# Patient Record
Sex: Male | Born: 1960 | Race: White | Hispanic: No | Marital: Married | Smoking: Former smoker
Health system: Southern US, Community
[De-identification: ages and names within clinical notes are randomized; demographics above are authoritative.]

## PROBLEM LIST (undated history)

## (undated) DIAGNOSIS — Z86718 Personal history of other venous thrombosis and embolism: Secondary | ICD-10-CM

## (undated) DIAGNOSIS — I4892 Unspecified atrial flutter: Secondary | ICD-10-CM

## (undated) DIAGNOSIS — I429 Cardiomyopathy, unspecified: Secondary | ICD-10-CM

## (undated) DIAGNOSIS — R0600 Dyspnea, unspecified: Secondary | ICD-10-CM

## (undated) DIAGNOSIS — I2699 Other pulmonary embolism without acute cor pulmonale: Secondary | ICD-10-CM

## (undated) DIAGNOSIS — K219 Gastro-esophageal reflux disease without esophagitis: Secondary | ICD-10-CM

## (undated) HISTORY — DX: Other pulmonary embolism without acute cor pulmonale: I26.99

## (undated) HISTORY — DX: Personal history of other venous thrombosis and embolism: Z86.718

---

## 2017-05-14 ENCOUNTER — Encounter: Payer: Self-pay | Admitting: Internal Medicine

## 2017-05-14 ENCOUNTER — Ambulatory Visit (INDEPENDENT_AMBULATORY_CARE_PROVIDER_SITE_OTHER): Payer: Managed Care, Other (non HMO) | Admitting: Internal Medicine

## 2017-05-14 VITALS — BP 136/92 | HR 64 | Ht 75.0 in | Wt 278.8 lb

## 2017-05-14 DIAGNOSIS — R0609 Other forms of dyspnea: Secondary | ICD-10-CM | POA: Diagnosis not present

## 2017-05-14 DIAGNOSIS — I82409 Acute embolism and thrombosis of unspecified deep veins of unspecified lower extremity: Secondary | ICD-10-CM | POA: Insufficient documentation

## 2017-05-14 DIAGNOSIS — I82502 Chronic embolism and thrombosis of unspecified deep veins of left lower extremity: Secondary | ICD-10-CM

## 2017-05-14 DIAGNOSIS — R05 Cough: Secondary | ICD-10-CM

## 2017-05-14 DIAGNOSIS — IMO0001 Reserved for inherently not codable concepts without codable children: Secondary | ICD-10-CM

## 2017-05-14 DIAGNOSIS — R058 Other specified cough: Secondary | ICD-10-CM

## 2017-05-14 MED ORDER — PANTOPRAZOLE SODIUM 40 MG PO TBEC: 40.0000 mg | DELAYED_RELEASE_TABLET | Freq: Every day | ORAL | 2 refills | Status: DC

## 2017-05-14 MED ORDER — RIVAROXABAN 10 MG PO TABS: 10.0000 mg | ORAL_TABLET | Freq: Every day | ORAL | 11 refills | Status: DC

## 2017-05-14 MED ORDER — FAMOTIDINE 20 MG PO TABS: ORAL_TABLET | ORAL | 11 refills | Status: DC

## 2017-05-14 NOTE — Patient Instructions (Addendum)
Xarelto 10 mg daily   Pantoprazole (protonix) 40 mg   Take  30-60 min before first meal of the day and Pepcid (famotidine)  20 mg one @  bedtime until return to office - this is the best way to tell whether stomach acid is contributing to your problem.      GERD (REFLUX)  is an extremely common cause of respiratory symptoms just like yours , many times with no obvious heartburn at all.    It can be treated with medication, but also with lifestyle changes including elevation of the head of your bed (ideally with 6 inch  bed blocks),  Smoking cessation, avoidance of late meals, excessive alcohol, and avoid fatty foods, chocolate, peppermint, colas, red wine, and acidic juices such as orange juice.  NO MINT OR MENTHOL PRODUCTS SO NO COUGH DROPS   USE SUGARLESS CANDY INSTEAD (Jolley ranchers or Stover's or Life Savers) or even ice chips will also do - the key is to swallow to prevent all throat clearing. NO OIL BASED VITAMINS - use powdered substitutes.   Please schedule a follow up office visit in 6 weeks, call sooner if needed  With pfts on return

## 2017-05-14 NOTE — Progress Notes (Signed)
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 Pulmonary Consult    Self referral. Pt has hx of PE. Pt states he has spells and chest congestion and SOB for the past 4 yrs. He states his symptoms come and go, and today is a good day for him.  He last had symptoms approx 6 wks ago. He has occ noct cough- non prod at this time.   reproducible doe x steps Walks 3 miles 3 times a week in 30 min some hills  Every night coughs x 2 years immediately on supine position  Worse cough with voice use or beer drinking  Note father and brother may also have dvt/pe but his w/u for hypercoagulability neg in [place] per report    No obvious other patterns in sob or cough with any significant day to day or daytime variability or assoc excess/ purulent sputum or mucus plugs or hemoptysis or cp or chest tightness, subjective wheeze or overt sinus or hb symptoms. No unusual exp hx or h/o childhood pna/ asthma or knowledge of premature birth.  Sleeping ok without nocturnal  or early am exacerbation  of respiratory  c/o's or need for noct saba. Also denies any obvious fluctuation of symptoms with weather or environmental changes or other aggravating or alleviating factors except as outlined above   Current Medications, Allergies, Complete Past Medical History, Past Surgical History, Family History,  and Social History were reviewed in [employer] record.  ROS  The following are not active complaints unless bolded sore throat, dysphagia, dental problems, itching, sneezing,  nasal congestion or excess/ purulent secretions, ear ache,   fever, chills, sweats, unintended wt loss, classically pleuritic or exertional cp,  orthopnea pnd or  leg swelling - L, severe w/in 2 h of taking off elastic hose and standing, presyncope, palpitations, abdominal pain, anorexia, nausea, vomiting, diarrhea  or change in bowel or bladder habits, change in stools or urine, dysuria,hematuria,  rash, arthralgias, visual complaints, headache, numbness, weakness or ataxia or problems with walking or coordination,  change in mood/affect or memory.                  Review of Systems     Objective:   Physical Exam amb anxious wm nad  Wt Readings from Last 3 Encounters:  05/14/17 278 lb 12.8 oz (126.5 kg)    Vital signs reviewed / - Note on arrival 02 sats  98% on RA    HEENT: nl dentition, turbinates bilaterally, and oropharynx. Nl external ear canals without cough reflex   NECK :  without JVD/Nodes/TM/ nl carotid upstrokes bilaterally   LUNGS: no acc muscle use,  Nl contour chest which is clear to A and P bilaterally  without cough on insp or exp maneuvers   CV:  RRR  no s3 or murmur or increase in P2, and tight elastic hose L leg not removed   ABD:  soft and nontender with nl inspiratory excursion in the supine position. No bruits or organomegaly appreciated, bowel sounds nl  MS:  Nl gait/ ext warm without deformities, calf tenderness, cyanosis or clubbing No obvious joint restrictions   SKIN: warm and dry without lesions    NEURO:  alert, approp, nl sensorium with  no motor or cerebellar deficits apparent.          Assessment:

## 2017-05-15 DIAGNOSIS — R05 Cough: Secondary | ICD-10-CM | POA: Insufficient documentation

## 2017-05-15 DIAGNOSIS — R058 Other specified cough: Secondary | ICD-10-CM | POA: Insufficient documentation

## 2017-05-15 DIAGNOSIS — R0609 Other forms of dyspnea: Secondary | ICD-10-CM

## 2017-05-15 NOTE — Assessment & Plan Note (Signed)
Body mass index is 34.85 kg/m.    No results found for: TSH   Contributing to gerd risk/ doe/reviewed the need and the process to achieve and maintain neg calorie balance > defer f/u primary care including intermittently monitoring thyroid status

## 2017-05-15 NOTE — Assessment & Plan Note (Signed)
Pattern of cough is classic for Upper airway cough syndrome (previously labeled PNDS) , is  so named because it's frequently impossible to sort out how much is  CR/sinusitis with freq throat clearing (which can be related to primary GERD)   vs  causing  secondary (" extra esophageal")  GERD from wide swings in gastric pressure that occur with throat clearing, often  promoting self use of mint and menthol lozenges that reduce the lower esophageal sphincter tone and exacerbate the problem further in a cyclical fashion.   These are the same pts (now being labeled as having "irritable larynx syndrome" by some cough centers) who not infrequently have a history of having failed to tolerate ace inhibitors,  dry powder inhalers or biphosphonates or report having atypical/extraesophageal reflux symptoms that don't respond to standard doses of PPI  and are easily confused as having aecopd or asthma flares by even experienced allergists/ pulmonologists (myself included).   rec diet/ gerd rx x 6 weeks then regroup   Total time devoted to counseling  > 50 % of initial 60 min office visit:  review case(including extensive outside records) with pt/wife discussion of options/alternatives/ personally creating written customized instructions  in presence of pt  then going over those specific  Instructions directly with the pt including how to use all of the meds but in particular covering each new medication in detail and the difference between the maintenance= "automatic" meds and the prns using an action plan format for the latter (If this problem/symptom => do that organization reading Left to right).  Please see AVS from this visit for a full list of these instructions which I personally wrote for this pt and  are unique to this visit.

## 2017-05-15 NOTE — Assessment & Plan Note (Addendum)
Hypercoagulable w/u 05/29/13>   Negative (scanned into epic) Venous Doppler on L  03/30/16  1. No evidence of lower extremity deep vein thrombosis, left. 2. Superficial thrombophlebitis, left mid calf. -  Restart DOAC 05/14/2017  @ maint rx = xarelto 10 mg daily   Clearly this isn't active / acute dvt but given hx and obvious venous insuff/ stasis he is at risk of recurrent dvt and pe and this warrants lifelong maint DOAC in absence of some reason not to do so with risk of bleeding roughly equivalent to asa.  Discussed in detail all the  indications, usual  risks and alternatives  relative to the benefits with patient/ wife Darin Garcia  who agree  to proceed with rx as outlined

## 2017-05-15 NOTE — Assessment & Plan Note (Signed)
S/ p PE 2014 [place] - Echo 05/05/17 wnl  - rx for gerd 05/14/2017  (see uacs)   No evidence at all for [facility] or other sequelae from PE or other source of sob but note he gained significant wt p his ankle fx and this may contributed to change in ex tol and also tendency to gerd   If not better p 6 weeks next step is cpst

## 2017-06-28 ENCOUNTER — Other Ambulatory Visit (INDEPENDENT_AMBULATORY_CARE_PROVIDER_SITE_OTHER): Payer: Managed Care, Other (non HMO)

## 2017-06-28 ENCOUNTER — Ambulatory Visit (INDEPENDENT_AMBULATORY_CARE_PROVIDER_SITE_OTHER): Payer: Managed Care, Other (non HMO) | Admitting: Internal Medicine

## 2017-06-28 ENCOUNTER — Encounter: Payer: Self-pay | Admitting: Internal Medicine

## 2017-06-28 VITALS — BP 122/74 | HR 55 | Ht 76.0 in | Wt 272.0 lb

## 2017-06-28 DIAGNOSIS — R05 Cough: Secondary | ICD-10-CM | POA: Diagnosis not present

## 2017-06-28 DIAGNOSIS — R0609 Other forms of dyspnea: Secondary | ICD-10-CM | POA: Diagnosis not present

## 2017-06-28 DIAGNOSIS — R058 Other specified cough: Secondary | ICD-10-CM

## 2017-06-28 DIAGNOSIS — I82502 Chronic embolism and thrombosis of unspecified deep veins of left lower extremity: Secondary | ICD-10-CM | POA: Diagnosis not present

## 2017-06-28 DIAGNOSIS — IMO0001 Reserved for inherently not codable concepts without codable children: Secondary | ICD-10-CM

## 2017-06-28 LAB — CBC WITH DIFFERENTIAL/PLATELET
Basophils Absolute: 0 10*3/uL (ref 0.0–0.1)
Basophils Relative: 0.6 % (ref 0.0–3.0)
EOS ABS: 0 10*3/uL (ref 0.0–0.7)
EOS PCT: 0.5 % (ref 0.0–5.0)
HCT: 46 % (ref 39.0–52.0)
HEMOGLOBIN: 15.7 g/dL (ref 13.0–17.0)
LYMPHS ABS: 2.3 10*3/uL (ref 0.7–4.0)
Lymphocytes Relative: 31.5 % (ref 12.0–46.0)
MCHC: 34.2 g/dL (ref 30.0–36.0)
MCV: 90.2 fl (ref 78.0–100.0)
MONO ABS: 0.7 10*3/uL (ref 0.1–1.0)
Monocytes Relative: 9.2 % (ref 3.0–12.0)
NEUTROS PCT: 58.2 % (ref 43.0–77.0)
Neutro Abs: 4.3 10*3/uL (ref 1.4–7.7)
Platelets: 269 10*3/uL (ref 150.0–400.0)
RBC: 5.11 Mil/uL (ref 4.22–5.81)
RDW: 12.5 % (ref 11.5–15.5)
WBC: 7.4 10*3/uL (ref 4.0–10.5)

## 2017-06-28 LAB — PULMONARY FUNCTION TEST
DL/VA % pred: 89 %
DL/VA: 4.4 ml/min/mmHg/L
DLCO UNC % PRED: 88 %
DLCO cor % pred: 86 %
DLCO cor: 34.88 ml/min/mmHg
DLCO unc: 35.73 ml/min/mmHg
FEF 25-75 PRE: 5.64 L/s
FEF 25-75 Post: 6.9 L/sec
FEF2575-%Change-Post: 22 %
FEF2575-%PRED-POST: 180 %
FEF2575-%Pred-Pre: 147 %
FEV1-%CHANGE-POST: 3 %
FEV1-%PRED-POST: 105 %
FEV1-%PRED-PRE: 101 %
FEV1-POST: 4.82 L
FEV1-Pre: 4.64 L
FEV1FVC-%Change-Post: 3 %
FEV1FVC-%Pred-Pre: 110 %
FEV6-%CHANGE-POST: 0 %
FEV6-%PRED-POST: 94 %
FEV6-%Pred-Pre: 94 %
FEV6-POST: 5.45 L
FEV6-PRE: 5.46 L
FEV6FVC-%Change-Post: 0 %
FEV6FVC-%PRED-POST: 104 %
FEV6FVC-%PRED-PRE: 104 %
FVC-%CHANGE-POST: 0 %
FVC-%Pred-Post: 91 %
FVC-%Pred-Pre: 91 %
FVC-Post: 5.45 L
FVC-Pre: 5.46 L
POST FEV1/FVC RATIO: 88 %
POST FEV6/FVC RATIO: 100 %
PRE FEV6/FVC RATIO: 100 %
Pre FEV1/FVC ratio: 85 %
RV % pred: 78 %
RV: 1.92 L
TLC % PRED: 88 %
TLC: 7.28 L

## 2017-06-28 NOTE — Assessment & Plan Note (Signed)
Hypercoagulable w/u 05/29/13>   Negative (scanned into epic) Venous Doppler on L  03/30/16  1. No evidence of lower extremity deep vein thrombosis, left. 2. Superficial thrombophlebitis, left mid calf. 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

## 2017-06-28 NOTE — Progress Notes (Signed)
PFT done today. 

## 2017-06-28 NOTE — Assessment & Plan Note (Signed)
S/ p PE 2014 [place] - Echo 05/05/17 wnl  - rx for gerd 05/14/2017  (see uacs)  - PFT's  06/28/2017  Completely wnl   His bmi is 33 and he has not done regular ex since ankle injury that predate the dvt / pe but see no reason why he can't start reconditioning at this point  I had an extended discussion with the patient reviewing all relevant studies completed to date and  lasting 15 to 20 minutes of a 25 minute visit    Each maintenance medication was reviewed in detail including most importantly the difference between maintenance and prns and under what circumstances the prns are to be triggered using an action plan format that is not reflected in the computer generated alphabetically organized AVS.    Please see AVS for specific instructions unique to this visit that I personally wrote and verbalized to the the pt in detail and then reviewed with pt  by my nurse highlighting any  changes in therapy recommended at today's visit to their plan of care.

## 2017-06-28 NOTE — Assessment & Plan Note (Signed)
Body mass index is 33.11 kg/m.  -  trending down/ encouraged No results found for: TSH   Contributing to gerd risk/ doe/reviewed the need and the process to achieve and maintain neg calorie balance > defer f/u primary care including intermittently monitoring thyroid status

## 2017-06-28 NOTE — Progress Notes (Signed)
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    History of Present Illness  05/14/2017 1st [facility] office visit/ Harve Spradley   Chief Complaint  Patient presents with  . Pulmonary Consult    Self referral. Pt has hx of PE. Pt states he has spells and chest congestion and SOB for the past 4 yrs. He states his symptoms come and go, and today is a good day for him.  He last had symptoms approx 6 wks ago. He has occ noct cough- non prod at this time.   reproducible doe x steps Walks 3 miles 3 times a week in 30 min some hills  Every night coughs x 2 years immediately on supine position  Worse cough with voice use or beer drinking  Note father and brother may also have dvt/pe but his w/u for hypercoagulability neg in [place] per report  rec Xarelto 10 mg daily  Pantoprazole (protonix) 40 mg   Take  30-60 min before first meal of the day and Pepcid (famotidine)  20 mg one @  bedtime until return to office - this is the best way to tell whether stomach acid is contributing to your problem.   GERD diet     06/28/2017  f/u ov/Janaa Acero re:   PE/dvt/ uacs better while on gerd rx  Chief Complaint  Patient presents with  . Follow-up    Pt had a PFT and is here to find out the results of that. Denies any complaints or concerns.   has not started any ex/ leg swelling less on xarelto, not always taking with food   Really Not limited by breathing from desired  activities    No obvious day to day or daytime variability or assoc excess/ purulent sputum or mucus plugs or hemoptysis or cp or chest tightness, subjective wheeze or overt sinus or hb symptoms. No unusual exp hx or h/o childhood pna/ asthma or knowledge of premature birth.  Sleeping ok without nocturnal  or early am exacerbation  of respiratory  c/o's or need for noct saba. Also denies any obvious fluctuation of symptoms with weather or environmental changes or other aggravating or alleviating factors except as outlined above   Current Medications, Allergies, Complete Past Medical History, Past Surgical History, Family History, and Social History were reviewed in [employer] record.  ROS  The following are not active complaints unless bolded sore throat, dysphagia, dental problems, itching, sneezing,  nasal congestion or excess/ purulent secretions, ear ache,   fever, chills, sweats, unintended wt loss, classically pleuritic or exertional cp,  orthopnea pnd or leg swelling, presyncope, palpitations, abdominal pain, anorexia, nausea, vomiting, diarrhea  or change in bowel or bladder habits, change in stools or urine, dysuria,hematuria,  rash, arthralgias, visual complaints, headache, numbness, weakness or ataxia or problems with walking  or coordination,  change in mood/affect or memory.                .        Objective:   Physical Exam  amb anxious wm nad   06/28/2017         272  05/14/17 278 lb 12.8 oz (126.5 kg)    Vital signs reviewed / - Note on arrival 02 sats  97% on RA    HEENT: nl dentition, turbinates bilaterally, and oropharynx. Nl external ear canals without cough reflex   NECK :  without JVD/Nodes/TM/ nl carotid upstrokes bilaterally   LUNGS: no acc muscle use,  Nl contour chest which is clear to A and P bilaterally without cough on insp or exp maneuvers   CV:  RRR  no s3 or murmur or increase in P2, and  Trace edema L ankle    ABD:  soft  and nontender with nl inspiratory excursion in the supine position. No bruits or organomegaly appreciated, bowel sounds nl  MS:  Nl gait/ ext warm without deformities, calf tenderness, cyanosis or clubbing No obvious joint restrictions   SKIN: warm and dry without lesions    NEURO:  alert, approp, nl sensorium with  no motor or cerebellar deficits apparent.          Assessment:

## 2017-06-28 NOTE — Assessment & Plan Note (Signed)
gerd rx 05/14/2017 >>> - Allergy profile 06/28/2017 >  Eos 0.0 /  IgE  Pending   Better on gerd rx - will add 1st gen h1 prn and f/u prn

## 2017-06-28 NOTE — Patient Instructions (Addendum)
Restart Pantoprazole (protonix) 40 mg   Take  30-60 min before first meal of the day and Pepcid (famotidine)  20 mg one @  bedtime until return to office - this is the best way to tell whether stomach acid is contributing to your problem.    GERD (REFLUX)  is an extremely common cause of respiratory symptoms just like yours , many times with no obvious heartburn at all.    It can be treated with medication, but also with lifestyle changes including elevation of the head of your bed (ideally with 6 inch  bed blocks),  Smoking cessation, avoidance of late meals, excessive alcohol, and avoid fatty foods, chocolate, peppermint, colas, red wine, and acidic juices such as orange juice.  NO MINT OR MENTHOL PRODUCTS SO NO COUGH DROPS   USE SUGARLESS CANDY INSTEAD (Jolley ranchers or Stover's or Life Savers) or even ice chips will also do - the key is to swallow to prevent all throat clearing. NO OIL BASED VITAMINS - use powdered substitutes.    For drainage / throat tickle try take CHLORPHENIRAMINE  4 mg - take one every 4 hours as needed - available over the counter- may cause drowsiness so start with just a bedtime dose or two and see how you tolerate it before trying in daytime    To get the most out of exercise, you need to be continuously aware that you are short of breath, but never out of breath, for 30 minutes daily. As you improve, it will actually be easier for you to do the same amount of exercise  in  30 minutes so always push to the level where you are short of breath.    Please schedule a follow up visit in 3 months but call sooner if needed

## 2017-06-29 NOTE — Progress Notes (Signed)
Spoke with pt and notified of results per Dr. Wert. Pt verbalized understanding and denied any questions. 

## 2017-08-30 ENCOUNTER — Other Ambulatory Visit: Payer: Self-pay | Admitting: Internal Medicine

## 2017-09-28 ENCOUNTER — Ambulatory Visit: Payer: Managed Care, Other (non HMO) | Admitting: Internal Medicine

## 2017-10-05 ENCOUNTER — Encounter: Payer: Self-pay | Admitting: Internal Medicine

## 2017-10-05 ENCOUNTER — Ambulatory Visit: Payer: 59 | Admitting: Internal Medicine

## 2017-10-05 VITALS — BP 124/84 | HR 55 | Ht 75.0 in | Wt 273.6 lb

## 2017-10-05 DIAGNOSIS — R0609 Other forms of dyspnea: Secondary | ICD-10-CM | POA: Diagnosis not present

## 2017-10-05 DIAGNOSIS — R05 Cough: Secondary | ICD-10-CM

## 2017-10-05 DIAGNOSIS — I82502 Chronic embolism and thrombosis of unspecified deep veins of left lower extremity: Secondary | ICD-10-CM

## 2017-10-05 DIAGNOSIS — IMO0001 Reserved for inherently not codable concepts without codable children: Secondary | ICD-10-CM

## 2017-10-05 DIAGNOSIS — R058 Other specified cough: Secondary | ICD-10-CM

## 2017-10-05 NOTE — Assessment & Plan Note (Signed)
S/ p PE 2014 [place] - Echo 05/05/17 wnl  - rx for gerd 05/14/2017  (see uacs)  - PFT's  06/28/2017  Completely wnl   Improved but not yet aerobically active > rec regular submaximal ex and CPST if not back to 100% of baseline

## 2017-10-05 NOTE — Progress Notes (Signed)
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 > w/u  Pos for sup phlebitis L calf 03/30/16    History of Present Illness  05/14/2017 1st [facility] office visit/ Venkat Ankney  On no anticoagulants Chief Complaint  Patient presents with  . Pulmonary Consult    Self referral. Pt has hx of PE. Pt states he has spells and chest congestion and SOB for the past 4 yrs. He states his symptoms come and go, and today is a good day for him.  He last had symptoms approx 6 wks ago. He has occ noct cough- non prod at this time.   reproducible doe x steps Walks 3 miles 3 times a week in 30 min some hills  Every night coughs x 2 years immediately on supine position  Worse cough with voice use or beer drinking  Note father and brother may also have dvt/pe but his w/u for hypercoagulability neg in [place] per report  rec Xarelto 10 mg daily  Pantoprazole (protonix) 40 mg   Take  30-60 min before first meal of the day and Pepcid (famotidine)  20 mg one @  bedtime until return to office - this is the best way to tell whether stomach acid is contributing to your problem.   GERD diet     06/28/2017  f/u ov/Tyreese Thain re:   PE/dvt/ uacs better while on gerd rx  Chief Complaint  Patient presents with  . Follow-up    Pt had a PFT and is here to find out the results of that. Denies any complaints or concerns.   has not started any ex/ leg swelling less on xarelto, not always  taking with food  rec Restart Pantoprazole (protonix) 40 mg   Take  30-60 min before first meal of the day and Pepcid (famotidine)  20 mg one @  bedtime until return to office - this is the best way to tell whether stomach acid is contributing to your problem.   GERD  For drainage / throat tickle try take CHLORPHENIRAMINE  4 mg - take one every 4 hours as needed - available over the counter- may cause drowsiness so start with just a bedtime dose or two and see how you tolerate it before trying in daytime   To get the most out of exercise, you need to be continuously aware that you are short of breath, but never out of breath, for 30 minutes daily. As you improve, it will actually be easier for you to do the same amount of exercise  in  30 minutes so always push to the level where you are short of breath.  Please schedule a follow up visit in 3 months but call sooner if needed       10/05/2017  f/u ov/Braden Deloach re: chronic cough / sup phlebitis s/p  PE maint on prophylactic xarelto  Chief Complaint  Patient presents with  . Follow-up    Breathing is back to his normal baseline.   no more cough/ no sob or significant leg swelling using elastic hose   Not limited by breathing from desired activities  But has not started back with aerobic activity yet   No obvious day to day or daytime variability or assoc excess/ purulent sputum or mucus plugs or hemoptysis or cp or chest tightness, subjective wheeze or overt sinus or hb symptoms. No unusual exp hx or h/o childhood pna/ asthma or knowledge of premature birth.  Sleeping ok flat without nocturnal  or early am exacerbation  of respiratory  c/o's or need for noct saba. Also denies any obvious fluctuation of symptoms with weather or environmental changes or other aggravating or alleviating factors except as outlined above   Current Allergies, Complete Past Medical History, Past Surgical History, Family History, and Social History were reviewed in  [employer] record.  ROS  The following are not active complaints unless bolded Hoarseness, sore throat, dysphagia, dental problems, itching, sneezing,  nasal congestion or discharge of excess mucus or purulent secretions, ear ache,   fever, chills, sweats, unintended wt loss or wt gain, classically pleuritic or exertional cp,  orthopnea pnd or leg swelling improved, presyncope, palpitations, abdominal pain, anorexia, nausea, vomiting, diarrhea  or change in bowel habits or change in bladder habits, change in stools or change in urine, dysuria, hematuria,  rash, arthralgias, visual complaints, headache, numbness, weakness or ataxia or problems with walking or coordination,  change in mood/affect or memory.        Current Meds  Medication Sig  . rivaroxaban (XARELTO) 10 MG TABS tablet Take 1 tablet (10 mg total) by mouth daily.              Objective:   Physical Exam  amb anxious wm nad   06/28/2017         272  05/14/17 278 lb 12.8 oz (126.5 kg)    Vital signs reviewed / - Note on arrival 02 sats  100% on RA       HEENT: nl dentition, turbinates bilaterally, and oropharynx. Nl external ear canals without cough reflex   NECK :  without JVD/Nodes/TM/ nl carotid upstrokes bilaterally   LUNGS: no acc muscle use,  Nl contour chest which is clear to A and P bilaterally without cough on insp or exp maneuvers   CV:  RRR  no s3 or murmur or increase in P2, and no edema - wearing thigh high elastic hose s  pitting noted  ABD:  soft and nontender with nl inspiratory excursion in the supine position. No bruits or organomegaly appreciated, bowel sounds nl  MS:  Nl gait/ ext warm without deformities, calf tenderness, cyanosis or clubbing No obvious joint restrictions   SKIN: warm and dry without lesions    NEURO:  alert, approp, nl sensorium with  no motor or cerebellar deficits apparent.       Assessment:

## 2017-10-05 NOTE — Assessment & Plan Note (Signed)
Resolved/  No need for meds/ f/u prn

## 2017-10-05 NOTE — Patient Instructions (Signed)
No change in medications    Please schedule a follow up visit in 6  months but call sooner if needed  

## 2017-10-05 NOTE — Assessment & Plan Note (Signed)
Dx 2014   Hypercoagulable w/u 05/29/13>   Negative (scanned into epic) Venous Doppler on L  03/30/16  1. No evidence of lower extremity deep vein thrombosis, left. 2. Superficial thrombophlebitis, left mid calf. -  Restart DOAC 05/14/2017  @ maint rx = xarelto 10 mg daily    Discussed in detail all the  indications, usual  risks and alternatives  relative to the benefits with patient who agrees to proceed with indefinite prophylactic doses of xarelto given h/o pe and prior ongoing leg symptoms which have completely resolved since starting the low dose rx    Each maintenance medication was reviewed in detail including most importantly the difference between maintenance and as needed and under what circumstances the prns are to be used.  Please see AVS for specific  Instructions which are unique to this visit and I personally typed out  which were reviewed in detail in writing with the patient and a copy provided.       F/u can be q 6 m

## 2018-01-12 ENCOUNTER — Telehealth: Payer: Self-pay | Admitting: Internal Medicine

## 2018-01-12 MED ORDER — CEFDINIR 300 MG PO CAPS: 300.0000 mg | ORAL_CAPSULE | Freq: Two times a day (BID) | ORAL | 0 refills | Status: DC

## 2018-01-12 NOTE — Telephone Encounter (Signed)
Called and spoke with pt who states yesterday he started having chills, had an uncontrollable cough with brown mucus, pain/pressure in both sinus cavities.  Pt thought he might have had a fever due to the chills he has experiencing but is unsure.  Due to pt being in [facility], pt was wanting to know if something can be called in for him to help with his symptoms due to him not scheduled to be back at home until Friday.  Dr. Sherene Sires, please advise on this for pt.  Thanks!

## 2018-01-12 NOTE — Telephone Encounter (Signed)
Pt aware of recs.  rx sent to preferred pharmacy.  Pt will call back after getting in town to schedule appt if not better.  Nothing further needed.

## 2018-01-12 NOTE — Telephone Encounter (Signed)
Omnicef 300 mg 1 twice a day for 10 days and follow-up in office in 2 weeks about 100% better

## 2018-01-14 ENCOUNTER — Encounter: Payer: Self-pay | Admitting: Internal Medicine

## 2018-01-14 ENCOUNTER — Other Ambulatory Visit: Payer: Managed Care, Other (non HMO)

## 2018-01-14 ENCOUNTER — Ambulatory Visit (INDEPENDENT_AMBULATORY_CARE_PROVIDER_SITE_OTHER)
Admission: RE | Admit: 2018-01-14 | Discharge: 2018-01-14 | Disposition: A | Discharge status: Home / Self Care | Payer: Managed Care, Other (non HMO) | Source: Ambulatory Visit | Attending: Internal Medicine | Admitting: Internal Medicine

## 2018-01-14 ENCOUNTER — Ambulatory Visit (INDEPENDENT_AMBULATORY_CARE_PROVIDER_SITE_OTHER): Payer: Managed Care, Other (non HMO) | Admitting: Internal Medicine

## 2018-01-14 VITALS — BP 148/90 | HR 78 | Temp 98.3°F | Ht 75.0 in | Wt 279.4 lb

## 2018-01-14 DIAGNOSIS — R05 Cough: Secondary | ICD-10-CM | POA: Diagnosis not present

## 2018-01-14 DIAGNOSIS — R058 Other specified cough: Secondary | ICD-10-CM

## 2018-01-14 MED ORDER — METHYLPREDNISOLONE ACETATE 80 MG/ML IJ SUSP
120.0000 mg | Freq: Once | INTRAMUSCULAR | Status: AC
  Administered 2018-01-14: 120 mg via INTRAMUSCULAR

## 2018-01-14 NOTE — Patient Instructions (Addendum)
For congested cough best choice over the counter is mucinex dm take up to 1200 mg every 12 hours as needed with large glass of water     Only use your albuterol as a rescue medication to be used if you can't catch your breath by resting or doing a relaxed purse lip breathing pattern.  - The less you use it, the better it will work when you need it. - Ok to use up to 2 puffs  every 4 hours if you must but call for immediate appointment if use goes up over your usual need - Don't leave home without it !!  (think of it like the spare tire for your car)   Any time you start coughing> Try prilosec otc 20mg   Take 30-60 min before first meal of the day and Pepcid ac (famotidine) 20 mg one @  bedtime until cough is completely gone for at least a week without the need for cough suppression     GERD (REFLUX)  is an extremely common cause of respiratory symptoms just like yours , many times with no obvious heartburn at all.    It can be treated with medication, but also with lifestyle changes including elevation of the head of your bed (ideally with 6 inch  bed blocks),  Smoking cessation, avoidance of late meals, excessive alcohol, and avoid fatty foods, chocolate, peppermint, colas, red wine, and acidic juices such as orange juice.  NO MINT OR MENTHOL PRODUCTS SO NO COUGH DROPS   USE SUGARLESS CANDY INSTEAD (Jolley ranchers or Stover's or Life Savers) or even ice chips will also do - the key is to swallow to prevent all throat clearing. NO OIL BASED VITAMINS - use powdered substitutes.   Please remember to go to the  x-ray department downstairs in the basement  for your tests - we will call you with the results when they are available.      If not 100% better after you finish your antibiotics please call for sinus CT - otherwise keep your previous appt

## 2018-01-14 NOTE — Progress Notes (Signed)
Was able to talk to the patient regarding their results.  They verbalized an understanding of what was discussed. No further questions at this time. 

## 2018-01-14 NOTE — Progress Notes (Signed)
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 > w/u  Pos for sup phlebitis L calf 03/30/16    History of Present Illness  05/14/2017 1st [facility] office visit/ Clearence Vitug  On no anticoagulants Chief Complaint  Patient presents with  . Pulmonary Consult    Self referral. Pt has hx of PE. Pt states he has spells and chest congestion and SOB for the past 4 yrs. He states his symptoms come and go, and today is a good day for him.  He last had symptoms approx 6 wks ago. He has occ noct cough- non prod at this time.   reproducible doe x steps Walks 3 miles 3 times a week in 30 min some hills  Every night coughs x 2 years immediately on supine position  Worse cough with voice use or beer drinking  Note father and brother may also have dvt/pe but his w/u for hypercoagulability neg in [place] per report  rec Xarelto 10 mg daily  Pantoprazole (protonix) 40 mg   Take  30-60 min before first meal of the day and Pepcid (famotidine)  20 mg one @  bedtime until return to office - this is the best way to tell whether stomach acid is contributing to your problem.   GERD diet     06/28/2017  f/u ov/Radie Berges re:   PE/dvt/ uacs better while on gerd rx  Chief Complaint  Patient presents with  . Follow-up    Pt had a PFT and is here to find out the results of that. Denies any complaints or concerns.   has not started any ex/ leg swelling less on xarelto, not always taking  with food  rec Restart Pantoprazole (protonix) 40 mg   Take  30-60 min before first meal of the day and Pepcid (famotidine)  20 mg one @  bedtime until return to office - this is the best way to tell whether stomach acid is contributing to your problem.   GERD  For drainage / throat tickle try take CHLORPHENIRAMINE  4 mg - take one every 4 hours as needed - available over the counter- may cause drowsiness so start with just a bedtime dose or two and see how you tolerate it before trying in daytime   To get the most out of exercise, you need to be continuously aware that you are short of breath, but never out of breath, for 30 minutes daily. As you improve, it will actually be easier for you to do the same amount of exercise  in  30 minutes so always push to the level where you are short of breath.  Please schedule a follow up visit in 3 months but call sooner if needed       10/05/2017  f/u ov/Kamani Lewter re: chronic cough / Lsup phlebitis s/p PE  maint on prophylactic xarelto  Chief Complaint  Patient presents with  . Follow-up    Breathing is back to his normal baseline.   no more cough/ no sob or significant leg swelling using elastic hose  rec No change x 6 m   01/14/2018 acute extended ov/Therron Sells re: cough/ L cp Chief Complaint  Patient presents with  . Acute Visit    Called in on 01/12/18 with c/o cough and sinus pain- rx omnicef x 10 days. He is now wheezing and feeling SOB- had to use rescue inhaler.  He also has had night sweats. No fever today.   acute onset 01/11/18 bad chills/ facial/frontal ha/ cough with brown mucus rx 01/12/18 with omnicef Cp center to slt left of sternum s radiation  worse with coughing esp one day prior to OV  Assoc sob resolved p saba  Recurrent pattern of URI /sinusitis x 3 years / not on maint gerd rx Chills and sinus pain better x last 24 h prior to OV   - no saba on day of ov    No obvious day to day or daytime variability or assoc    mucus plugs or hemoptysis  or cp or chest tightness, overt   hb symptoms. No unusual exposure hx or h/o childhood pna/ asthma or knowledge of premature birth.   Also denies any obvious fluctuation of symptoms with weather or environmental changes or other aggravating or alleviating factors except as outlined above   Current Allergies, Complete Past Medical History, Past Surgical History, Family History, and Social History were reviewed in [employer] record.  ROS  The following are not active complaints unless bolded Hoarseness, sore throat, dysphagia, dental problems, itching, sneezing,  nasal congestion or discharge of excess mucus or purulent secretions, ear ache,   fever, chills, sweats, unintended wt loss or wt gain, classically   exertional cp,  orthopnea pnd or leg swelling, presyncope, palpitations, abdominal pain, anorexia, nausea, vomiting, diarrhea  or change in bowel habits or change in bladder habits, change in stools or change in urine, dysuria, hematuria,  rash, arthralgias, visual complaints, headache, numbness, weakness or ataxia or problems with walking or coordination,  change in mood/affect or memory.        Current Meds  Medication Sig  . albuterol (PROAIR HFA) 108 (90 Base) MCG/ACT inhaler Inhale 2 puffs into the lungs every 6 (six) hours as needed for wheezing or shortness of breath.  . cefdinir (OMNICEF) 300 MG capsule Take 1 capsule (300 mg total) by mouth 2 (two) times daily.  . rivaroxaban (XARELTO) 10 MG TABS tablet Take 1 tablet (10 mg total) by mouth daily.                      Objective:   Physical Exam  amb anxious wm nad  Wt Readings from Last 3 Encounters:  01/14/18 279 lb 6.4 oz (126.7 kg)  10/05/17 273 lb 9.6 oz (124.1 kg)  06/28/17 272 lb (123.4 kg)     Vital signs reviewed - Note on arrival 02 sats  99% on RA        HEENT: nl dentition,  and oropharynx. Nl external ear canals without cough reflex - moderate bilateral non-specific turbinate  edema     NECK :  without JVD/Nodes/TM/ nl carotid upstrokes bilaterally   LUNGS: no acc muscle use,  Nl contour chest with minimal insp / exp rhonchi with cough on exp maneuvers    CV:  RRR  no s3 or murmur or increase in P2, and no edema - wearing thigh high elastic hose s pitting noted    ABD:  soft and nontender with nl inspiratory excursion in the supine position. No bruits or organomegaly appreciated, bowel sounds nl  MS:  Nl gait/ ext warm without deformities, calf tenderness, cyanosis or clubbing No obvious joint restrictions   SKIN: warm and dry without lesions    NEURO:  alert, approp, nl sensorium with  no motor or cerebellar deficits apparent.      CXR PA and Lateral:   01/14/2018 :    I personally reviewed images and agree with radiology impression as follows:    No radiographic evidence of acute cardiopulmonary disease.        Assessment:

## 2018-01-14 NOTE — Progress Notes (Unsigned)
alergy p

## 2018-01-14 NOTE — Assessment & Plan Note (Addendum)
gerd rx 05/14/2017 >>> did not maint long term - Allergy profile 06/28/2017 >  Eos 0.0      Of the three most common causes of  Sub-acute or recurrent or chronic cough, only one (GERD)  can actually contribute to/ trigger  the other two (asthma and post nasal drip syndrome)  and perpetuate the cylce of cough.  While not intuitively obvious, many patients with chronic low grade reflux do not cough until there is a primary insult that disturbs the protective epithelial barrier and exposes sensitive nerve endings.   This is typically viral but can be due to recurrent sinusitis with pnds as in this case and if doesn't resolve p rx then next step is sinus ct    The point is that once this occurs, it is difficult to eliminate the cycle  using anything but a maximally effective acid suppression regimen at least in the short run, accompanied by an appropriate diet to address non acid GERD and control / eliminate the cough itself for at least 3 days.    rec rx as sinusitis then sinus CT if not better / depomedrol 120 mg IM x one for any inflammatory airway component  I had an extended discussion with the patient reviewing all relevant studies completed to date and  lasting 15 to 20 minutes of a 25 minute acute office visit    Each maintenance medication was reviewed in detail including most importantly the difference between maintenance and prns and under what circumstances the prns are to be triggered using an action plan format that is not reflected in the computer generated alphabetically organized AVS.    Please see AVS for specific instructions unique to this visit that I personally wrote and verbalized to the the pt in detail and then reviewed with pt  by my nurse highlighting any  changes in therapy recommended at today's visit to their plan of care.

## 2018-01-16 ENCOUNTER — Encounter: Payer: Self-pay | Admitting: Internal Medicine

## 2018-01-17 LAB — RESPIRATORY ALLERGY PROFILE REGION II ~~LOC~~
Allergen, A. alternata, m6: <0.1 kU/L
Allergen, Cedar tree, t12: <0.1 kU/L
Allergen, Comm Silver Birch, t9: <0.1 kU/L
Allergen, Cottonwood, t14: 0.16 kU/L — ABNORMAL HIGH
Allergen, Mouse Urine Protein, e78: <0.1 kU/L
Allergen, Mulberry, t76: <0.1 kU/L
Bermuda Grass: <0.1 kU/L
CLADOSPORIUM HERBARUM (M2) IGE: <0.1 kU/L
CLASS: 0
CLASS: 0
CLASS: 0
CLASS: 0
CLASS: 0
CLASS: 0
CLASS: 0
CLASS: 0
COMMON RAGWEED (SHORT) (W1) IGE: <0.1 kU/L
Cat Dander: 0.14 kU/L — ABNORMAL HIGH
Class: 0
Class: 0
Class: 0
Class: 0
Class: 0
Class: 0
Class: 0
Class: 0
Class: 0
Class: 0
Class: 0
Class: 0
Class: 0
Class: 0
Class: 0
Class: 1
Dog Dander: 0.13 kU/L — ABNORMAL HIGH
ELM IGE: 0.12 kU/L — AB
IGE (IMMUNOGLOBULIN E), SERUM: 25 kU/L (ref <=114)
JOHNSON GRASS: 0.14 kU/L — AB
Pecan/Hickory Tree IgE: 0.57 kU/L — ABNORMAL HIGH
Timothy Grass: <0.1 kU/L

## 2018-01-17 LAB — INTERPRETATION:

## 2018-01-18 ENCOUNTER — Telehealth: Payer: Self-pay | Admitting: Internal Medicine

## 2018-01-18 NOTE — Telephone Encounter (Signed)
Notes recorded by Nyoka Cowden, MD on 01/17/2018 at 5:18 PM EST Call patient : Studies are c/w very minimal allergy to grass, tree, cat and dog > main rx is avoidance >>> no change in recs for now  Advised pt of results. Pt understood and nothing further is needed.

## 2018-01-18 NOTE — Progress Notes (Signed)
LMTCB

## 2018-01-19 ENCOUNTER — Telehealth: Payer: Self-pay | Admitting: Internal Medicine

## 2018-01-19 NOTE — Telephone Encounter (Signed)
Called and spoke with pt letting him know the results of his labwork.  Pt expressed understanding. Nothing further needed at this current time.

## 2018-01-19 NOTE — Progress Notes (Signed)
LMTCB on preferred phone number listed for patient. 

## 2018-04-04 ENCOUNTER — Encounter: Payer: Self-pay | Admitting: Internal Medicine

## 2018-04-04 ENCOUNTER — Ambulatory Visit (INDEPENDENT_AMBULATORY_CARE_PROVIDER_SITE_OTHER): Payer: Managed Care, Other (non HMO) | Admitting: Internal Medicine

## 2018-04-04 DIAGNOSIS — I82502 Chronic embolism and thrombosis of unspecified deep veins of left lower extremity: Secondary | ICD-10-CM | POA: Diagnosis not present

## 2018-04-04 DIAGNOSIS — IMO0001 Reserved for inherently not codable concepts without codable children: Secondary | ICD-10-CM

## 2018-04-04 DIAGNOSIS — R0609 Other forms of dyspnea: Secondary | ICD-10-CM

## 2018-04-04 DIAGNOSIS — R05 Cough: Secondary | ICD-10-CM

## 2018-04-04 DIAGNOSIS — R058 Other specified cough: Secondary | ICD-10-CM

## 2018-04-04 MED ORDER — RIVAROXABAN 10 MG PO TABS: 10.0000 mg | ORAL_TABLET | Freq: Every day | ORAL | 11 refills | Status: DC

## 2018-04-04 NOTE — Assessment & Plan Note (Addendum)
Dx 2014   Hypercoagulable w/u 05/29/13>   Negative (scanned into epic) Venous Doppler on L  03/30/16  1. No evidence of lower extremity deep vein thrombosis, left. 2. Superficial thrombophlebitis, left mid calf. -  Restart DOAC 05/14/2017  @ maint rx = xarelto 10 mg daily indefinitely     Discussed in detail all the  indications, usual  risks and alternatives  relative to the benefits with patient who agrees to proceed with longterm xarelto but welcome second opinion from vascular surgery for other options > referred to Dr Hart Rochester .    I had an extended discussion with the patient reviewing all relevant studies completed to date and  lasting 15 to 20 minutes of a 25 minute visit    Each maintenance medication was reviewed in detail including most importantly the difference between maintenance and prns and under what circumstances the prns are to be triggered using an action plan format that is not reflected in the computer generated alphabetically organized AVS.    Please see AVS for specific instructions unique to this visit that I personally wrote and verbalized to the the pt in detail and then reviewed with pt  by my nurse highlighting any  changes in therapy recommended at today's visit to their plan of care.

## 2018-04-04 NOTE — Assessment & Plan Note (Signed)
gerd rx 05/14/2017 >>> did not maint long term - Allergy profile 06/28/2017 >  Eos 0.0   And RAST 25  Pos grass trees cat and dog    Reviewed studies/ action plan for max gerd in event of flare of cough and prn saba for sob/ wheeze

## 2018-04-04 NOTE — Progress Notes (Signed)
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 > w/u  Pos for sup phlebitis L calf 03/30/16 > xarelto   History of Present Illness  05/14/2017 1st [facility] office visit/ Sarajean Dessert  On no anticoagulants Chief Complaint  Patient presents with  . Pulmonary Consult    Self referral. Pt has hx of PE. Pt states he has spells and chest congestion and SOB for the past 4 yrs. He states his symptoms come and go, and today is a good day for him.  He last had symptoms approx 6 wks ago. He has occ noct cough- non prod at this time.   reproducible doe x steps Walks 3 miles 3 times a week in 30 min some hills  Every night coughs x 2 years immediately on supine position  Worse cough with voice use or beer drinking  Note father and brother may also have dvt/pe but his w/u for hypercoagulability neg in [place] per report  rec Xarelto 10 mg daily  Pantoprazole (protonix) 40 mg   Take  30-60 min before first meal of the day and Pepcid (famotidine)  20 mg one @  bedtime until return to office - this is the best way to tell whether stomach acid is contributing to your problem.   GERD diet     06/28/2017  f/u ov/Dynver Clemson re:   PE/dvt/ uacs better while on gerd rx  Chief Complaint  Patient presents with  . Follow-up    Pt had a PFT and is here to find out the results of that. Denies any complaints or concerns.   has not started any ex/ leg swelling less on xarelto, not  always taking with food  rec Restart Pantoprazole (protonix) 40 mg   Take  30-60 min before first meal of the day and Pepcid (famotidine)  20 mg one @  bedtime until return to office - this is the best way to tell whether stomach acid is contributing to your problem.   GERD  For drainage / throat tickle try take CHLORPHENIRAMINE  4 mg - take one every 4 hours as needed - available over the counter- may cause drowsiness so start with just a bedtime dose or two and see how you tolerate it before trying in daytime   To get the most out of exercise, you need to be continuously aware that you are short of breath, but never out of breath, for 30 minutes daily. As you improve, it will actually be easier for you to do the same amount of exercise  in  30 minutes so always push to the level where you are short of breath.  Please schedule a follow up visit in 3 months but call sooner if needed       10/05/2017  f/u ov/Jacqeline Broers re: chronic cough / Lsup phlebitis s/p  PE maint on prophylactic xarelto  Chief Complaint  Patient presents with  . Follow-up    Breathing is back to his normal baseline.   no more cough/ no sob or significant leg swelling using elastic hose  rec No change x 6 m     01/14/2018 acute extended ov/Claudean Leavelle re: cough/ L cp Chief Complaint  Patient presents with  . Acute Visit    Called in on 01/12/18 with c/o cough and sinus pain- rx omnicef x 10 days. He is now wheezing and feeling SOB- had to use rescue inhaler.  He also has had night sweats. No fever today.   acute onset 01/11/18 bad chills/ facial/frontal ha/ cough with brown mucus rx 01/12/18 with omnicef Cp center to slt left of sternum s radiation  worse with coughing esp one day prior to OV  Assoc sob resolved p saba  Recurrent pattern of URI /sinusitis x 3 years / not on maint gerd rx Chills and sinus pain better x last 24 h prior to OV   - no saba on day of ov  rec For congested cough best choice over the counter is mucinex dm  take up to 1200 mg every 12 hours as needed with large glass of water  Only use your albuterol as a rescue medication Any time you start coughing> Try prilosec otc 20mg   Take 30-60 min before first meal of the day and Pepcid ac (famotidine) 20 mg one @  bedtime until cough is completely gone for at least a week without the need for cough suppression GERD diet            04/04/2018  f/u ov/Annais Crafts re:  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 Follow-up    Cough has resolved. No new co's.    Dyspnea:  Not limited by breathing from desired activities  Including aerobic activity  Cough: resolved Sleep: fine SABA use: no need     No obvious day to day or daytime variability or assoc excess/ purulent sputum or mucus plugs or hemoptysis or cp or chest tightness, subjective wheeze or overt sinus or hb symptoms. No unusual exposure hx or h/o childhood pna/ asthma or knowledge of premature birth.  Sleeping  Fine flat  without nocturnal  or early am exacerbation  of respiratory  c/o's or need for noct saba. Also denies any obvious fluctuation of symptoms with weather or environmental changes or other aggravating or alleviating factors except as outlined above   Current Allergies, Complete Past Medical History, Past Surgical History, Family History, and Social History were reviewed in [employer] record.  ROS  The following are not active complaints unless bolded Hoarseness, sore throat, dysphagia, dental problems, itching, sneezing,  nasal congestion or discharge of excess mucus or purulent secretions, ear ache,   fever, chills, sweats, unintended wt loss or wt gain, classically pleuritic or exertional cp,  orthopnea pnd or arm/hand swelling  or leg swelling L >>R , presyncope, palpitations, abdominal pain, anorexia, nausea, vomiting, diarrhea  or change in bowel habits or change in bladder habits, change in stools or change in urine,  dysuria, hematuria,  rash, arthralgias, visual complaints, headache, numbness, weakness or ataxia or problems with walking or coordination,  change in mood or  memory.        Current Meds  Medication Sig  . albuterol (PROAIR HFA) 108 (90 Base) MCG/ACT inhaler Inhale 2 puffs into the lungs  every 6 (six) hours as needed for wheezing or shortness of breath.  . rivaroxaban (XARELTO) 10 MG TABS tablet Take 1 tablet (10 mg total) by mouth daily.  .                           Objective:   Physical Exam  amb wm nad   04/04/2018       249   01/14/18 279 lb 6.4 oz (126.7 kg)  10/05/17 273 lb 9.6 oz (124.1 kg)  06/28/17 272 lb (123.4 kg)      Vital signs reviewed - Note on arrival 02 sats  100% on RA    HEENT: nl dentition, turbinates bilaterally, and oropharynx. Nl external ear canals without cough reflex   NECK :  without JVD/Nodes/TM/ nl carotid upstrokes bilaterally   LUNGS: no acc muscle use,  Nl contour chest which is clear to A and P bilaterally without cough on insp or exp maneuvers   CV:  RRR  no s3 or murmur or increase in P2, and no edema  But serpinous vericosities both legs L >> R   ABD:  soft and nontender with nl inspiratory excursion in the supine position. No bruits or organomegaly appreciated, bowel sounds nl  MS:  Nl gait/ ext warm without deformities, calf tenderness, cyanosis or clubbing No obvious joint restrictions   SKIN: warm and dry without lesions    NEURO:  alert, approp, nl sensorium with  no motor or cerebellar deficits apparent.                      Assessment:

## 2018-04-04 NOTE — Assessment & Plan Note (Addendum)
S/ p PE 2014 [place] - Echo 05/05/17 wnl  - rx for gerd 05/14/2017  (see uacs)  - PFT's  06/28/2017  Completely wnl    Resolved with wt loss / congratulated

## 2018-04-04 NOTE — Patient Instructions (Addendum)
Please see patient coordinator before you leave today  to schedule venous evalution by Dr Clent Ridges service    Please schedule a follow up visit in 12  months but call sooner if needed

## 2018-04-12 ENCOUNTER — Other Ambulatory Visit: Payer: Self-pay

## 2018-04-12 DIAGNOSIS — M79605 Pain in left leg: Secondary | ICD-10-CM

## 2018-04-12 DIAGNOSIS — M7989 Other specified soft tissue disorders: Principal | ICD-10-CM

## 2018-04-21 ENCOUNTER — Encounter: Payer: Self-pay | Admitting: Vascular Surgery

## 2018-04-21 ENCOUNTER — Ambulatory Visit (INDEPENDENT_AMBULATORY_CARE_PROVIDER_SITE_OTHER): Payer: Managed Care, Other (non HMO) | Admitting: Vascular Surgery

## 2018-04-21 ENCOUNTER — Ambulatory Visit (HOSPITAL_COMMUNITY)
Admission: RE | Admit: 2018-04-21 | Discharge: 2018-04-21 | Disposition: A | Discharge status: Home / Self Care | Payer: Managed Care, Other (non HMO) | Source: Ambulatory Visit | Attending: Vascular Surgery | Admitting: Vascular Surgery

## 2018-04-21 ENCOUNTER — Other Ambulatory Visit: Payer: Self-pay

## 2018-04-21 VITALS — BP 116/77 | HR 66 | Resp 20 | Ht 75.0 in | Wt 246.0 lb

## 2018-04-21 DIAGNOSIS — M79605 Pain in left leg: Secondary | ICD-10-CM | POA: Insufficient documentation

## 2018-04-21 DIAGNOSIS — Z86718 Personal history of other venous thrombosis and embolism: Secondary | ICD-10-CM | POA: Diagnosis not present

## 2018-04-21 DIAGNOSIS — I83892 Varicose veins of left lower extremities with other complications: Secondary | ICD-10-CM | POA: Diagnosis not present

## 2018-04-21 DIAGNOSIS — M7989 Other specified soft tissue disorders: Secondary | ICD-10-CM | POA: Insufficient documentation

## 2018-04-21 DIAGNOSIS — R9389 Abnormal findings on diagnostic imaging of other specified body structures: Secondary | ICD-10-CM | POA: Insufficient documentation

## 2018-04-21 NOTE — Progress Notes (Signed)
Referring Physician: Dr Sherene Sires  Patient name: Darin Garcia MRN: [id number] DOB: Aug 17, 1961 Sex: male  REASON FOR CONSULT: Left leg swelling HPI: Darin Garcia is a 57 y.o. male furred for evaluation of chronic left leg swelling.  The patient has had a prior DVT on 3 prior occasions.  These have always been in the left leg.  He did have a pulmonary embolus on 2 prior occasions.  He has family history of pulmonary embolus as his father died of one.  He is on chronic Xarelto.  Plan currently is to continue this for at least 1 more year.  This is followed by Dr. Sherene Sires  He has never had any leg swelling in the right leg.  He does wear compression stockings intermittently.  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    Past Medical History:  Diagnosis Date  . History of DVT (deep vein thrombosis)   . Pulmonary embolism (HCC)    History reviewed. No pertinent surgical history.  History reviewed. No pertinent family history.  SOCIAL HISTORY: Social History   Socioeconomic History  . Marital status: Married    Spouse name: Not on file  . Number of children: Not on file  . Years of education: Not on file  . Highest education level: Not on file  Occupational History  . Not on file  Social Needs  . Financial resource strain: Not on file  . Food insecurity:    Worry: Not on file    Inability: Not on file  . Transportation needs:    Medical: Not on file    Non-medical: Not on file  Tobacco Use  . Smoking status: Former Smoker    Packs/day: 1.00    Years: 4.00    Pack years: 4.00    Types: Cigarettes    Last attempt to quit: 11/23/1993    Years since quitting: 24.4  . Smokeless tobacco: Never Used  Substance and Sexual Activity  . Alcohol use: Not on file  . Drug use: Not on file  . Sexual activity: Not on file  Lifestyle  . Physical activity:    Days per week: Not on  file    Minutes per session: Not on file  . Stress: Not on file  Relationships  . Social connections:    Talks on phone: Not on file    Gets together: Not on file    Attends religious service: Not on file    Active member of club or organization: Not on file    Attends meetings of clubs or organizations: Not on file    Relationship status: Not on file  . Intimate partner violence:    Fear of current or ex partner: Not on file    Emotionally abused: Not on file    Physically abused: Not on file    Forced sexual activity: Not on file  Other Topics Concern  . Not on file  Social History Narrative  . Not on file    No Known Allergies  Current Outpatient Medications  Medication Sig Dispense Refill  . rivaroxaban (XARELTO) 10 MG TABS tablet Take 1 tablet (10 mg total) by mouth daily. 30 tablet 11  . albuterol (PROAIR HFA) 108 (90 Base) MCG/ACT inhaler Inhale 2 puffs into the lungs every 6 (six) hours as needed for wheezing or shortness of breath.  No current facility-administered medications for this visit.     ROS:   General:  No weight loss, Fever, chills  HEENT: No recent headaches, no nasal bleeding, no visual changes, no sore throat  Neurologic: No dizziness, blackouts, seizures. No recent symptoms of stroke or mini- stroke. No recent episodes of slurred speech, or temporary blindness.  Cardiac: No recent episodes of chest pain/pressure, no shortness of breath at rest.  No shortness of breath with exertion.  Denies history of atrial fibrillation or irregular heartbeat  Vascular: No history of rest pain in feet.  No history of claudication.  No history of non-healing ulcer, No history of DVT   Pulmonary: No home oxygen, no productive cough, no hemoptysis,  No asthma or wheezing  Musculoskeletal:  [ ]  Arthritis, [ ]  Low back pain,  [ ]  Joint pain  Hematologic:No history of hypercoagulable state.  No history of easy bleeding.  No history of anemia  Gastrointestinal:  No hematochezia or melena,  No gastroesophageal reflux, no trouble swallowing  Urinary: [ ]  chronic Kidney disease, [ ]  on HD - [ ]  MWF or [ ]  TTHS, [ ]  Burning with urination, [ ]  Frequent urination, [ ]  Difficulty urinating;   Skin: No rashes  Psychological: No history of anxiety,  No history of depression   Physical Examination  Vitals:   04/21/18 1047  BP: 116/77  Pulse: 66  Resp: 20  SpO2: 98%  Weight: 246 lb (111.6 kg)  Height: 6\' 3"  (1.905 m)    Body mass index is 30.75 kg/m.  General:  Alert and oriented, no acute distress HEENT: Normal Neck: No bruit or JVD Pulmonary: Clear to auscultation bilaterally Cardiac: Regular Rate and Rhythm without murmur Abdomen: Soft, non-tender, non-distended, no mass Skin: No rash Extremity Pulses:  2+ radial, brachial, femoral, dorsalis pedis, posterior tibial pulses bilaterally Musculoskeletal: No deformity trace pretibial left leg edema  Neurologic: Upper and lower extremity motor 5/5 and symmetric        DATA:  Patient had a venous duplex exam today of the left leg.  This showed no evidence of DVT.  He had diffuse reflux throughout the common femoral popliteal deep veins.  He also had reflux in the greater saphenous vein with a vein diameter of 4-1/2 to 5 mm.  It was evidence of reflux at the saphenofemoral junction.  ASSESSMENT: Patient with chronic left leg swelling.  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  I discussed the pathophysiology of the venous systems with the patient today.   PLAN: Patient was offered ablation of his left greater saphenous vein to try to improve overall leg swelling symptoms.  Discussed with him that with this would probably improve but not completely alleviate his symptoms.  I also reassured him that he was not at risk of limb loss or inadequate perfusion in his right leg as his arterial circulation  was healthy.  I did discuss with him that regardless of whether or not we did an ablation of his left leg it would be recommended that he continue to wear compression stockings.  He has opted for compression therapy alone at this point.  He will follow-up with [place] on an as-needed basis if he wishes to consider laser ablation.  If he did not completely improve with laser ablation on the left leg then we would also consider a venogram of his iliac system to make sure  that he did not have iliac venous narrowing.  However, I believe this is probably less likely as if this occurs more in females than males.  He was given a brochure today for elastic therapy to obtain thigh-high compression stockings.  Fabienne Bruns, MD [facility] Office: [phone] Pager: [phone]

## 2018-11-23 ENCOUNTER — Inpatient Hospital Stay (HOSPITAL_BASED_OUTPATIENT_CLINIC_OR_DEPARTMENT_OTHER)
Admission: EM | Admit: 2018-11-23 | Discharge: 2018-11-25 | DRG: 309 | Disposition: A | Discharge status: Home / Self Care | Payer: Managed Care, Other (non HMO) | Attending: Cardiology | Admitting: Cardiology

## 2018-11-23 ENCOUNTER — Emergency Department (HOSPITAL_BASED_OUTPATIENT_CLINIC_OR_DEPARTMENT_OTHER): Payer: Managed Care, Other (non HMO)

## 2018-11-23 ENCOUNTER — Other Ambulatory Visit: Payer: Self-pay

## 2018-11-23 ENCOUNTER — Encounter (HOSPITAL_BASED_OUTPATIENT_CLINIC_OR_DEPARTMENT_OTHER): Payer: Self-pay

## 2018-11-23 DIAGNOSIS — R778 Other specified abnormalities of plasma proteins: Secondary | ICD-10-CM

## 2018-11-23 DIAGNOSIS — Z87891 Personal history of nicotine dependence: Secondary | ICD-10-CM

## 2018-11-23 DIAGNOSIS — Z7901 Long term (current) use of anticoagulants: Secondary | ICD-10-CM

## 2018-11-23 DIAGNOSIS — R402363 Coma scale, best motor response, obeys commands, at hospital admission: Secondary | ICD-10-CM | POA: Diagnosis present

## 2018-11-23 DIAGNOSIS — I483 Typical atrial flutter: Secondary | ICD-10-CM | POA: Diagnosis present

## 2018-11-23 DIAGNOSIS — I429 Cardiomyopathy, unspecified: Secondary | ICD-10-CM | POA: Diagnosis present

## 2018-11-23 DIAGNOSIS — J45909 Unspecified asthma, uncomplicated: Secondary | ICD-10-CM | POA: Diagnosis present

## 2018-11-23 DIAGNOSIS — I4892 Unspecified atrial flutter: Secondary | ICD-10-CM

## 2018-11-23 DIAGNOSIS — Z86711 Personal history of pulmonary embolism: Secondary | ICD-10-CM | POA: Diagnosis not present

## 2018-11-23 DIAGNOSIS — R0602 Shortness of breath: Secondary | ICD-10-CM | POA: Diagnosis not present

## 2018-11-23 DIAGNOSIS — I77810 Thoracic aortic ectasia: Secondary | ICD-10-CM | POA: Diagnosis present

## 2018-11-23 DIAGNOSIS — Z86718 Personal history of other venous thrombosis and embolism: Secondary | ICD-10-CM

## 2018-11-23 DIAGNOSIS — I420 Dilated cardiomyopathy: Secondary | ICD-10-CM | POA: Diagnosis not present

## 2018-11-23 DIAGNOSIS — I248 Other forms of acute ischemic heart disease: Secondary | ICD-10-CM | POA: Diagnosis present

## 2018-11-23 DIAGNOSIS — I2584 Coronary atherosclerosis due to calcified coronary lesion: Secondary | ICD-10-CM

## 2018-11-23 DIAGNOSIS — I739 Peripheral vascular disease, unspecified: Secondary | ICD-10-CM | POA: Diagnosis present

## 2018-11-23 DIAGNOSIS — R402253 Coma scale, best verbal response, oriented, at hospital admission: Secondary | ICD-10-CM | POA: Diagnosis present

## 2018-11-23 DIAGNOSIS — I2510 Atherosclerotic heart disease of native coronary artery without angina pectoris: Secondary | ICD-10-CM

## 2018-11-23 DIAGNOSIS — R402143 Coma scale, eyes open, spontaneous, at hospital admission: Secondary | ICD-10-CM | POA: Diagnosis present

## 2018-11-23 DIAGNOSIS — I340 Nonrheumatic mitral (valve) insufficiency: Secondary | ICD-10-CM | POA: Diagnosis not present

## 2018-11-23 DIAGNOSIS — R7989 Other specified abnormal findings of blood chemistry: Secondary | ICD-10-CM

## 2018-11-23 HISTORY — DX: Dyspnea, unspecified: R06.00

## 2018-11-23 HISTORY — DX: Gastro-esophageal reflux disease without esophagitis: K21.9

## 2018-11-23 HISTORY — DX: Unspecified atrial flutter: I48.92

## 2018-11-23 HISTORY — DX: Cardiomyopathy, unspecified: I42.9

## 2018-11-23 LAB — HEPATIC FUNCTION PANEL
ALT: 14 U/L (ref 0–44)
AST: 30 U/L (ref 15–41)
Albumin: 3.5 g/dL (ref 3.5–5.0)
Alkaline Phosphatase: 50 U/L (ref 38–126)
Bilirubin, Direct: 0.2 mg/dL (ref 0.0–0.2)
Indirect Bilirubin: 0.5 mg/dL (ref 0.3–0.9)
Total Bilirubin: 0.7 mg/dL (ref 0.3–1.2)
Total Protein: 5.9 g/dL — ABNORMAL LOW (ref 6.5–8.1)

## 2018-11-23 LAB — CBC
HCT: 45.8 % (ref 39.0–52.0)
Hemoglobin: 15.3 g/dL (ref 13.0–17.0)
MCH: 30.1 pg (ref 26.0–34.0)
MCHC: 33.4 g/dL (ref 30.0–36.0)
MCV: 90.2 fL (ref 80.0–100.0)
Platelets: 275 10*3/uL (ref 150–400)
RBC: 5.08 MIL/uL (ref 4.22–5.81)
RDW: 11.9 % (ref 11.5–15.5)
WBC: 7.8 10*3/uL (ref 4.0–10.5)
nRBC: 0 % (ref 0.0–0.2)

## 2018-11-23 LAB — BASIC METABOLIC PANEL
ANION GAP: 8 (ref 5–15)
BUN: 11 mg/dL (ref 6–20)
CALCIUM: 9.1 mg/dL (ref 8.9–10.3)
CO2: 22 mmol/L (ref 22–32)
Chloride: 104 mmol/L (ref 98–111)
Creatinine, Ser: 0.9 mg/dL (ref 0.61–1.24)
GLUCOSE: 100 mg/dL — AB (ref 70–99)
Potassium: 4 mmol/L (ref 3.5–5.1)
SODIUM: 134 mmol/L — AB (ref 135–145)

## 2018-11-23 LAB — TSH: TSH: 1.657 u[IU]/mL (ref 0.350–4.500)

## 2018-11-23 LAB — TROPONIN I
Troponin I: 0.04 ng/mL (ref <0.03)
Troponin I: 0.07 ng/mL (ref <0.03)

## 2018-11-23 MED ORDER — SODIUM CHLORIDE 0.9% FLUSH
3.0000 mL | Freq: Two times a day (BID) | INTRAVENOUS | Status: DC
  Administered 2018-11-23 – 2018-11-25 (×4): 3 mL via INTRAVENOUS

## 2018-11-23 MED ORDER — METOPROLOL TARTRATE 25 MG PO TABS
25.0000 mg | ORAL_TABLET | Freq: Two times a day (BID) | ORAL | Status: DC
  Administered 2018-11-23 – 2018-11-25 (×4): 25 mg via ORAL
  Filled 2018-11-23 (×4): qty 1

## 2018-11-23 MED ORDER — OFF THE BEAT BOOK
Freq: Once | Status: AC
  Administered 2018-11-23
  Filled 2018-11-23: qty 1

## 2018-11-23 MED ORDER — SODIUM CHLORIDE 0.9 % IV SOLN: 250.0000 mL | INTRAVENOUS | Status: DC | PRN

## 2018-11-23 MED ORDER — SODIUM CHLORIDE 0.9% FLUSH: 3.0000 mL | INTRAVENOUS | Status: DC | PRN

## 2018-11-23 MED ORDER — METOPROLOL TARTRATE 50 MG PO TABS
50.0000 mg | ORAL_TABLET | Freq: Two times a day (BID) | ORAL | Status: DC
Start: 2018-11-23 — End: 2018-11-23

## 2018-11-23 MED ORDER — RIVAROXABAN 20 MG PO TABS
20.0000 mg | ORAL_TABLET | Freq: Every day | ORAL | Status: DC
Start: 2018-11-23 — End: 2018-11-24
  Administered 2018-11-23: 20 mg via ORAL
  Filled 2018-11-23: qty 1

## 2018-11-23 MED ORDER — ONDANSETRON HCL 4 MG/2ML IJ SOLN: 4.0000 mg | Freq: Four times a day (QID) | INTRAMUSCULAR | Status: DC | PRN

## 2018-11-23 MED ORDER — TRAZODONE HCL 50 MG PO TABS
50.0000 mg | ORAL_TABLET | Freq: Once | ORAL | Status: AC | PRN
  Administered 2018-11-23: 50 mg via ORAL
  Filled 2018-11-23: qty 1

## 2018-11-23 MED ORDER — ALBUTEROL SULFATE (2.5 MG/3ML) 0.083% IN NEBU: 2.5000 mg | INHALATION_SOLUTION | Freq: Four times a day (QID) | RESPIRATORY_TRACT | Status: DC | PRN

## 2018-11-23 MED ORDER — ACETAMINOPHEN 325 MG PO TABS: 650.0000 mg | ORAL_TABLET | ORAL | Status: DC | PRN

## 2018-11-23 MED ORDER — METOPROLOL TARTRATE 5 MG/5ML IV SOLN
2.5000 mg | Freq: Once | INTRAVENOUS | Status: AC
  Administered 2018-11-23: 2.5 mg via INTRAVENOUS
  Filled 2018-11-23: qty 5

## 2018-11-23 MED ORDER — IOPAMIDOL (ISOVUE-370) INJECTION 76%
100.0000 mL | Freq: Once | INTRAVENOUS | Status: AC | PRN
  Administered 2018-11-23: 100 mL via INTRAVENOUS

## 2018-11-23 NOTE — ED Triage Notes (Addendum)
Pt states he was walking ~11am when he had sudden onset SOB, fatigue, weakness, dizziness-NAD-steady-pt states he has not felt well x 2 weeks-using albuterol inhaler today without relief

## 2018-11-23 NOTE — ED Notes (Signed)
Date and time results received: 11/23/18 1356 (use smartphrase ".now" to insert current time)  Test: Troponin Critical Value: 0.04  Name of Provider Notified: Dr. Dalene Seltzer  Orders Received? Or Actions Taken?:

## 2018-11-23 NOTE — ED Notes (Addendum)
Transferred to [facility] via [facility] with wife at bedside during this transfer.

## 2018-11-23 NOTE — ED Notes (Signed)
Pt on monitor 

## 2018-11-23 NOTE — H&P (Addendum)
Cardiology Admission History and Physical:   Patient ID: Darin Garcia MRN: [id number]; DOB: 08/27/1961   Admission date: 11/23/2018  Primary Care Provider: Cheron SchaumannVelazquez, Gretchen Y., MD Primary Cardiologist: Donato SchultzMark Erich Kochan, MD  Primary Electrophysiologist:  None   Chief Complaint: Shortness of breath  Patient Profile:   Darin Garcia is a 58 y.o. 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  EKG demonstrated atrial flutter with rapid ventricular rate.  He was transferred to [facility] for further evaluation and treatment.  History of Present Illness:   Darin Garcia has had a history of recurrent DVT and pulmonary embolism since 2014.  He believes he has had about 3 episodes.  He has been on indefinite anticoagulation with Rivaroxaban.  He was traveling for work at the beginning of December and did not take his Rivaroxaban for 1 week (December 2 through December 7).  He exercises on a regular basis and had been in his usual state of health until this past week.  He had begun to notice that he was more short of breath with simple activities and also felt fatigued.  He has had some episodes of dizziness as well.  Today while going on a walk, he felt much worse.  He felt very dizzy, fatigued and short of breath.  He stopped his walker early and came back home.  He did describe a feeling of his lungs burning when he was more short of breath.  Prior to today, he has not had any exertional chest pain.  He does have a prescription for a rescue inhaler and try to use it today.  However, he realized that it expired about 1 to 2 years ago.  He denies syncope.  He has not had any recent orthopnea, paroxysmal nocturnal dyspnea.  He has chronic left leg swelling related to his prior DVT.  He uses compression stockings.   Prior Cardiac  Studies Echocardiogram 04/2017 ([facility]) EF 60-65 Normal Echocardiogram   Past Medical History:  Diagnosis Date  . Dyspnea   . GERD (gastroesophageal reflux disease)   . History of DVT (deep vein thrombosis)   . Peripheral vascular disease (HCC)   . Pulmonary embolism [facility](HCC)    Past surgical history History reviewed. No pertinent surgical history.   Medications Prior to Admission: Prior to Admission medications   Medication Sig Start Date End Date Taking? Authorizing Provider  albuterol (PROAIR HFA) 108 (90 Base) MCG/ACT inhaler Inhale 2 puffs into the lungs every 6 (six) hours as needed for wheezing or shortness of breath.   Yes [provider]  rivaroxaban (XARELTO) 10 MG TABS tablet Take 1 tablet (10 mg total) by mouth daily. 04/04/18  Yes Nyoka CowdenWert, Michael B, MD     Allergies:   No Known Allergies  Social History:   Social History   Socioeconomic History  . Marital status: Married    Spouse name: Darin Garcia  . Number of children: Not on file  . Years of education: Not on file  . Highest education level: Not on file  Occupational History    Employer: [employer]  Social Needs  . Financial resource strain: Not very hard  . Food insecurity:    Worry: Never true    Inability: Never true  . Transportation needs:    Medical: No    Non-medical: No  Tobacco Use  .  Smoking status: Former Smoker    Packs/day: 1.00    Years: 4.00    Pack years: 4.00    Types: Cigarettes    Last attempt to quit: 11/23/1993    Years since quitting: 25.0  . Smokeless tobacco: Never Used  Substance and Sexual Activity  . Alcohol use: Yes    Comment: occ  . Drug use: Never  . Sexual activity: Not on file  Lifestyle  . Physical activity:    Days per week: 4 days    Minutes per session: 30 min  . Stress: Only a little  Relationships  . Social connections:    Talks on phone: Not on file    Gets together: Not on file    Attends religious service: Not on file    Active member of club or  organization: Not on file    Attends meetings of clubs or organizations: Not on file    Relationship status: Not on file  . Intimate partner violence:    Fear of current or ex partner: Not on file    Emotionally abused: Not on file    Physically abused: Not on file    Forced sexual activity: Not on file  Other Topics Concern  . Not on file  Social History Narrative   He is originally from [place].  He moved to [place] 3 years ago.  He is married.  He has 2 children (23 and 18).  He works in Airline pilot for [employer].    Family History:   The patient's family history includes CAD in his sister; Pulmonary embolism in his father.    ROS:  Please see the history of present illness.  He has not had any fevers, chills, cough, melena, hematochezia. All other ROS reviewed and negative.     Physical Exam/Data:   Vitals:   11/23/18 1251 11/23/18 1330 11/23/18 1400 11/23/18 1718  BP:  116/83 (!) 106/94 131/88  Pulse:  (!) 47 73 65  Resp:  14 19 20   Temp:    97.6 F (36.4 C)  TempSrc:    Oral  SpO2:  100% 100%   Weight: 109.8 kg     Height: 6\' 3"  (1.905 m)   (P) 6\' 3"  (1.905 m)   No intake or output data in the 24 hours ending 11/23/18 1845 Filed Weights   11/23/18 1251  Weight: 109.8 kg   Body mass index is 30.25 kg/m (pended).  General:  Well nourished, well developed, in no acute distress  HEENT: normal Lymph: no adenopathy Neck: no  JVD Endocrine:  No thryomegaly Vascular: No carotid bruits   Cardiac:  normal S1, S2; irreg irreg rhythm; no murmur   Lungs:  clear to auscultation bilaterally, no wheezing, rhonchi or rales  Abd: soft, nontender, no hepatomegaly  Ext: no edema Musculoskeletal:  No deformities  Skin: warm and dry  Neuro:  CNs 2-12 intact, no focal abnormalities noted Psych:  Normal affect    EKG:  The ECG that was done  was personally reviewed and demonstrates atrial flutter with heart rate 137, 2-1 conduction  Relevant CV Studies: None    Laboratory Data:  Chemistry Recent Labs  Lab 11/23/18 1306  NA 134*  K 4.0  CL 104  CO2 22  GLUCOSE 100*  BUN 11  CREATININE 0.90  CALCIUM 9.1  GFRNONAA >60  GFRAA >60  ANIONGAP 8     Hematology Recent Labs  Lab 11/23/18 1306  WBC 7.8  RBC 5.08  HGB  15.3  HCT 45.8  MCV 90.2  MCH 30.1  MCHC 33.4  RDW 11.9  PLT 275   Cardiac Enzymes Recent Labs  Lab 11/23/18 1306  TROPONINI 0.04*      Radiology/Studies:   Dg Chest 2 View Result Date: 11/23/2018 CLINICAL DATA:  Shortness of breath. EXAM: CHEST - 2 VIEW COMPARISON:  None. FINDINGS: Bandlike density in left lower chest could represent overlying structures. This opacity is not clearly identified on the lateral view. Otherwise, the lungs are clear. Heart size is normal. No pleural effusions. Bone structures are unremarkable.   IMPRESSION: Bandlike density in the left lower chest probably represents overlying structures. It appears that the patient is scheduled for a chest CT and recommend attention to this area on follow-up chest CT. Electronically Signed   By: Richarda Overlie M.D.   On: 11/23/2018 13:38    Ct Angio Chest Pe W And/or Wo Contrast Result Date: 11/23/2018 CLINICAL DATA:  Shortness of breath EXAM: CT ANGIOGRAPHY CHEST WITH CONTRAST TECHNIQUE: Multidetector CT imaging of the chest was performed using the standard protocol during bolus administration of intravenous contrast. Multiplanar CT image reconstructions and MIPs were obtained to evaluate the vascular anatomy. CONTRAST:  ISOVUE-370 IOPAMIDOL (ISOVUE-370) INJECTION 76% COMPARISON:  Chest x-ray from earlier today FINDINGS: Cardiovascular: Satisfactory opacification of the pulmonary arteries to the segmental level. No evidence of pulmonary embolism. Normal heart size. No pericardial effusion. Coronary atherosclerotic calcifications seen along the proximal LAD. Mediastinum/Nodes: Negative for adenopathy or mass Lungs/Pleura: There is no edema, consolidation,  effusion, or pneumothorax. Mild scarring at the left base. 3 mm ground-glass nodule in the right upper lobe on 7:36, below size threshold for follow-up per guidelines. Small bilateral calcified pulmonary nodules. Upper Abdomen: Negative Musculoskeletal: Bifid anterior right fifth rib Review of the MIP images confirms the above findings.  IMPRESSION:  1. Negative for pulmonary embolism or other acute finding  2. Coronary atherosclerosis.  Electronically Signed   By: Marnee Spring M.D.   On: 11/23/2018 14:33    Assessment and Plan:   1. Atrial Flutter with Rapid Ventricular Rate He was given a dose of IV Lopressor at [facility].  His HR is improved now and he appears comfortable.  There is no evidence of volume excess on exam.  CHADS2-VASc=1 (Coronary Calcification on CT).  He does not require long term anticoagulation for atrial flutter.  But, he does require long term anticoagulation for his hx of prior DVT and pulmonary embolism.    -Increase Rivaroxaban to 20 mg QD (first dose given just now)  -Obtain 2D Echo  -Check TSH  -Plan TEE-guided cardioversion after 2 doses of Rivaroxaban 20 mg (Friday 11/25/18)  -Consider EP eval - he may be a candidate for RFCA   2. Elevated Troponin Probably secondary to demand ischemia.  Will cycle enzymes.  No ASA as he is on full dose Rivaroxaban.  3. Coronary Calcification Incidental finding on CT.  Will get fasting Lipids and consider starting statin Rx for primary prevention.   4. Recurrent DVT/Pulmonary Embolism He is on long term anticoagulation with Rivaroxaban.  CT earlier today was negative for pulmonary embolism.     Severity of Illness: The appropriate patient status for this patient is INPATIENT. Inpatient status is judged to be reasonable and necessary in order to provide the required intensity of service to ensure the patient's safety. The patient's presenting symptoms, physical exam findings, and initial radiographic and laboratory data  in the context of their chronic comorbidities  is felt to place them at high risk for further clinical deterioration. Furthermore, it is not anticipated that the patient will be medically stable for discharge from the hospital within 2 midnights of admission. The following factors support the patient status of inpatient.   " The patient's presenting symptoms include shortness of breath, fatigue, dizziness. " The worrisome physical exam findings include atrial flutter with rapid ventricular response. " The initial radiographic and laboratory data are worrisome because of rapid heart rate. " The chronic co-morbidities include n/a.   * I certify that at the point of admission it is my clinical judgment that the patient will require inpatient hospital care spanning beyond 2 midnights from the point of admission due to high intensity of service, high risk for further deterioration and high frequency of surveillance required.*    For questions or updates, please contact [facility] Please consult [email or web address] for contact info under        Signed, Tereso Newcomer, PA-C  11/23/2018 6:45 PM   Personally seen and examined. Agree with above.  59 year old with new onset AFLUTTER. On Xarelto 10mg  given recent multiple DVT's and PE 2014 (Dr. Sherene Sires has seen, negative hypercoagulable state). More fatigue and SOB. No CP. Left leg edema chronic from D[place]. [place] [facility], found to be in AFLUTTER. 130 at times. Metoprolol reduced to 70's but HR increased with activity.  Felt a little dizzy. Did not feel comfortable going home.   GEN: Well nourished, well developed, in no acute distress  HEENT: normal  Neck: no JVD, carotid bruits, or masses Cardiac: irreg no murmurs, rubs, or gallops,no edema  Respiratory:  clear to auscultation bilaterally, normal work of breathing GI: soft, nontender, nondistended, + BS MS: no deformity or atrophy  Skin: warm and dry, no rash Neuro:  Alert and Oriented x 3, Strength  and sensation are intact Psych: euthymic mood, full affect   A/P:  ATRIAL FLUTTER with RVR  - CHADS VASC -1 (coronary calcium on CT)  - Increased Xarelto to 20 (given on 1/1). Will get second dose 1/2 then third on 1/3 Friday.  - At that point  - TEE CV.   - ECG looks like Typical flutter (up in V1). If returns EP ablation may be of benefit.   -He has been feeling shortness of breath and dizziness for quite some time looking back on this.  He was attributing some of this to his prior history of PE.  I wonder if prior PE is causing some right atrial stretch due to elevated pulmonary pressures.  We discussed cardioversion.  We discussed that after cardioversion he would likely maintain AV nodal blocking agent as well as Xarelto higher dose for at least 1 month then go back to the maintenance dose for DVT prophylaxis.  On the other hand, we may wish to keep him on the higher dose 20 mg Xarelto in case his atrial flutter returns so that he can be prepared for atrial flutter ablation.  -Telemetry currently shows atrial flutter with variable conduction, occasionally he is in the 70s but jumps into the 130s at times.  History of multiple DVT's  - Chronic Xarelto 10mg    Elevated Troponin  - Demand ischemia with RVR  Coronary calcium  - Statin consideration in future.   Donato Schultz, MD

## 2018-11-23 NOTE — Plan of Care (Signed)
  Problem: Education: Goal: Knowledge of General Education information will improve Description Including pain rating scale, medication(s)/side effects and non-pharmacologic comfort measures Outcome: Progressing   Problem: Clinical Measurements: Goal: Respiratory complications will improve Outcome: Progressing   Problem: Activity: Goal: Risk for activity intolerance will decrease Outcome: Progressing   Problem: Health Behavior/Discharge Planning: Goal: Ability to manage health-related needs will improve Outcome: Completed/Met

## 2018-11-23 NOTE — ED Provider Notes (Signed)
[facility] EMERGENCY DEPARTMENT Provider Note   CSN: [id number] Arrival date & time: 11/23/18  1242     History   Chief Complaint Chief Complaint  Patient presents with  . Shortness of Breath    HPI Darin Garcia is a 58 y.o. male.  HPI    58yo male with history of DVT, PE now on maintenance xarelto, presents with concern for shortness of breath, fatigue, lightheadedness.  Patient reports dyspnea on exertion over the last week. Reports typically he is able to be very active but one week ago he developed dyspnea with exertion and fatigue, however today at 11AM developed sudden worsening of symptoms while walking.  Felt sudden onset shortness of breath, fatigue, generalized weakness, lightheadedness. Describes "burning of the lungs" and a heaviness of the bilateral arms. Reports felt like he couldn't get enough oxygen, severe shortness of breath.  Reports the dyspnea and dizziness was severe and he could not continue to walk that he had to stop and move only a little bit at a time.  No nausea, vomiting, diarrhea. No sig cough. No medication changes.  Recent constipation took dulcolax.   Feels different than prior PE.        Past Medical History:  Diagnosis Date  . Dyspnea   . GERD (gastroesophageal reflux disease)   . History of DVT (deep vein thrombosis)   . Peripheral vascular disease (HCC)   . Pulmonary embolism [facility])     Patient Active Problem List   Diagnosis Date Noted  . Atrial flutter (HCC) 11/23/2018  . Dyspnea on exertion 05/15/2017  . Upper airway cough syndrome vs cough variant asthma 05/15/2017  . DVT, recurrent, lower extremity, chronic, left (HCC) with PE 2014 05/14/2017    History reviewed. No pertinent surgical history.      Home Medications    Prior to Admission medications   Medication Sig Start Date End Date Taking? Authorizing Provider  albuterol (PROAIR HFA) 108 (90 Base) MCG/ACT inhaler Inhale 2 puffs into the lungs every 6 (six)  hours as needed for wheezing or shortness of breath.   Yes [provider]    Family History Family History  Problem Relation Age of Onset  . Pulmonary embolism Father   . CAD Sister     Social History Social History   Tobacco Use  . Smoking status: Former Smoker    Packs/day: 1.00    Years: 4.00    Pack years: 4.00    Types: Cigarettes    Last attempt to quit: 11/23/1993    Years since quitting: 25.0  . Smokeless tobacco: Never Used  Substance Use Topics  . Alcohol use: Yes    Comment: occ  . Drug use: Never     Allergies   Patient has no known allergies.   Review of Systems Review of Systems  Constitutional: Positive for fatigue. Negative for fever.  HENT: Negative for sore throat.   Eyes: Negative for visual disturbance.  Respiratory: Positive for shortness of breath.   Cardiovascular: Positive for chest pain (burning).  Gastrointestinal: Negative for abdominal pain, nausea and vomiting.  Genitourinary: Negative for difficulty urinating.  Musculoskeletal: Positive for arthralgias (bilateral arms heaviness). Negative for back pain and neck stiffness.  Skin: Negative for rash.  Neurological: Positive for light-headedness. Negative for syncope and headaches.     Physical Exam Updated Vital Signs BP 113/78 (BP Location: Left Arm)   Pulse 70   Temp 97.8 F (36.6 C) (Oral)   Resp 11  Ht 6\' 3"  (1.905 m)   Wt 109.8 kg   SpO2 98%   BMI 30.25 kg/m   Physical Exam Vitals signs and nursing note reviewed.  Constitutional:      General: He is not in acute distress.    Appearance: He is well-developed. He is not diaphoretic.  HENT:     Head: Normocephalic and atraumatic.  Eyes:     Conjunctiva/sclera: Conjunctivae normal.  Neck:     Musculoskeletal: Normal range of motion.  Cardiovascular:     Rate and Rhythm: Regular rhythm. Tachycardia present.     Heart sounds: Normal heart sounds. No murmur. No friction rub. No gallop.   Pulmonary:      Effort: Pulmonary effort is normal. No respiratory distress.     Breath sounds: Normal breath sounds. No wheezing or rales.  Abdominal:     General: There is no distension.     Palpations: Abdomen is soft.     Tenderness: There is no abdominal tenderness. There is no guarding.  Skin:    General: Skin is warm and dry.  Neurological:     Mental Status: He is alert and oriented to person, place, and time.      ED Treatments / Results  Labs (all labs ordered are listed, but only abnormal results are displayed) Labs Reviewed  BASIC METABOLIC PANEL - Abnormal; Notable for the following components:      Result Value   Sodium 134 (*)    Glucose, Bld 100 (*)    All other components within normal limits  TROPONIN I - Abnormal; Notable for the following components:   Troponin I 0.04 (*)    All other components within normal limits  CBC  HIV ANTIBODY (ROUTINE TESTING W REFLEX)  TROPONIN I  TROPONIN I  TROPONIN I  HEPATIC FUNCTION PANEL  TSH  LIPID PANEL    EKG EKG Interpretation  Date/Time:  Wednesday November 23 2018 12:52:10 EST Ventricular Rate:  137 PR Interval:    QRS Duration: 80 QT Interval:  346 QTC Calculation: 522 R Axis:   104 Text Interpretation:  Atrial flutter with 2:1 A-V conduction Rightward axis Pulmonary disease pattern Marked ST abnormality, possible inferior subendocardial injury Abnormal ECG No previous ECGs available Confirmed by Izack Hoogland ([id number]) on 11/23/2018 12:57:39 PM   Radiology Dg Chest 2 View  Result Date: 11/23/2018 CLINICAL DATA:  Shortness of breath. EXAM: CHEST - 2 VIEW COMPARISON:  None. FINDINGS: Bandlike density in left lower chest could represent overlying structures. This opacity is not clearly identified on the lateral view. Otherwise, the lungs are clear. Heart size is normal. <MEASUREFranciscan St Elizabeth Health - [id number] Po GS: Cardiovascular: Satisfactory opacification of the pulmonary arteries to the segmental level. No evidence of pulmonary embolism. Normal heart size. No pericardial effusion.  Coronary atherosclerotic calcifications seen along the proximal LAD. Mediastinum/Nodes: Negative for adenopathy or mass Lungs/Pleura: There is no edema, consolidation, effusion, or pneumothorax. Mild scarring at the left base. 3 mm ground-glass nodule in the right upper lobe on 7:36, below size threshold for follow-up per guidelines. Small bilateral calcified pulmonary nodules. Upper Abdomen: Negative Musculoskeletal: Bifid anterior right fifth rib Review of the MIP images confirms the above findings. IMPRESSION: 1. Negative for pulmonary embolism or other acute finding 2. Coronary atherosclerosis. Electronically Signed   By: Marnee SpringJonathon  Watts M.D.   On: 11/23/2018 14:33    Procedures .Critical Care Performed by: Alvira MondaySchlossman, Neylan Koroma, MD Authorized by: Alvira MondaySchlossman, Katia Hannen, MD   Critical care provider statement:    Critical care time (minutes):  45   Critical care was necessary to treat or prevent imminent or life-threatening deterioration of the following conditions:  Cardiac failure   Critical care was time spent personally by me on the following activities:  Discussions  with consultants, evaluation of patient's response to treatment, development of treatment plan with patient or surrogate, review of old charts, re-evaluation of patient's condition, pulse oximetry, ordering and review of radiographic studies, ordering and review of laboratory studies, ordering and performing treatments and interventions and obtaining history from patient or surrogate   (including critical care time)  Medications Ordered in ED Medications  rivaroxaban (XARELTO) tablet 20 mg (20 mg Oral Given 11/23/18 1728)  albuterol (PROVENTIL) (2.5 MG/3ML) 0.083% nebulizer solution 2.5 mg (has no administration in time range)  acetaminophen (TYLENOL) tablet 650 mg (has no administration in time range)  ondansetron (ZOFRAN) injection 4 mg (has no administration in time range)  sodium chloride flush (NS) 0.9 % injection 3 mL (has no administration in time range)  sodium chloride flush (NS) 0.9 % injection 3 mL (has no administration in time range)  0.9 %  sodium chloride infusion (has no administration in time range)  metoprolol tartrate (LOPRESSOR) tablet 25 mg (has no administration in time range)  metoprolol tartrate (LOPRESSOR) injection 2.5 mg (2.5 mg Intravenous Given 11/23/18 1322)  iopamidol (ISOVUE-370) 76 % injection 100 mL (100 mLs Intravenous Contrast Given 11/23/18 1404)     Initial Impression / Assessment and Plan / ED Course  I have reviewed the triage vital signs and the nursing notes.  Pertinent labs & imaging results that were available during my care of the patient were reviewed by me and considered in my medical decision making (see chart for details).     58yo male with history of DVT, PE now on maintenance xarelto, presents with concern for shortness of breath, fatigue, lightheadedness.  CT PE study without PE.   Patient in atrial flutter on arrival with heart rate in the 140s, blood pressures stable, symptomatic with dyspnea, dizziness, and arm heaviness.    Gave  metoprolol 2.5mg  IV with improvement of rate, however continued lightheadedness.   Patient with onset of dyspnea on exertion one week ago, and is on only maintenance dose xarelto and is not a candidate for cardioversion.  While he feels improved at rest after metoprolol in the ED, with minimal standing and taking a few steps within ED room, he has fatigue and heart rate noted to increase to greater than 135 with continuing atrial flutter.  Given labile heart rate with atrial flutter and patient symptomatic with minimal movements, will admit to Cardiology with plan to cardiovert as an inpatient.     Final Clinical Impressions(s) / ED Diagnoses   Final diagnoses:  Atrial flutter with  rapid ventricular response (HCC)  Elevated troponin    ED Discharge Orders    None       Alvira Monday, MD 11/23/18 2052

## 2018-11-24 ENCOUNTER — Inpatient Hospital Stay (HOSPITAL_COMMUNITY): Payer: Managed Care, Other (non HMO)

## 2018-11-24 DIAGNOSIS — R0602 Shortness of breath: Secondary | ICD-10-CM

## 2018-11-24 LAB — LIPID PANEL
Cholesterol: 205 mg/dL — ABNORMAL HIGH (ref 0–200)
HDL: 56 mg/dL (ref >40)
LDL Cholesterol: 135 mg/dL — ABNORMAL HIGH (ref 0–99)
TRIGLYCERIDES: 68 mg/dL (ref <150)
Total CHOL/HDL Ratio: 3.7 RATIO
VLDL: 14 mg/dL (ref 0–40)

## 2018-11-24 LAB — TROPONIN I
Troponin I: 0.06 ng/mL (ref <0.03)
Troponin I: 0.05 ng/mL (ref <0.03)

## 2018-11-24 LAB — ECHOCARDIOGRAM COMPLETE
Height: 75 in
Weight: 3792 oz

## 2018-11-24 LAB — HIV ANTIBODY (ROUTINE TESTING W REFLEX): HIV Screen 4th Generation wRfx: NONREACTIVE

## 2018-11-24 MED ORDER — RIVAROXABAN 20 MG PO TABS
20.0000 mg | ORAL_TABLET | Freq: Every day | ORAL | Status: DC
  Administered 2018-11-25: 20 mg via ORAL
  Filled 2018-11-24: qty 1

## 2018-11-24 MED ORDER — RIVAROXABAN 20 MG PO TABS
20.0000 mg | ORAL_TABLET | Freq: Every day | ORAL | Status: AC
  Administered 2018-11-24: 20 mg via ORAL
  Filled 2018-11-24: qty 1

## 2018-11-24 NOTE — Progress Notes (Signed)
Progress Note  Patient Name: Darin Garcia Date of Encounter: 11/24/2018  Primary Cardiologist: Donato Schultz, MD   Subjective   Slept well.  Went over symptoms.  Minimal substernal sensation.  Dizziness.  Difficulty breathing at times.  Inpatient Medications    Scheduled Meds: . metoprolol tartrate  25 mg Oral BID  . Rivaroxaban  20 mg Oral Q supper  . sodium chloride flush  3 mL Intravenous Q12H   Continuous Infusions: . sodium chloride     PRN Meds: sodium chloride, acetaminophen, albuterol, ondansetron (ZOFRAN) IV, sodium chloride flush   Vital Signs    Vitals:   11/23/18 1400 11/23/18 1718 11/23/18 2045 11/24/18 0558  BP: (!) 106/94 131/88 113/78 121/81  Pulse: 73 65 70 70  Resp: 19 20 11 16   Temp:  97.6 F (36.4 C) 97.8 F (36.6 C) 98.1 F (36.7 C)  TempSrc:  Oral Oral Oral  SpO2: 100%  98% 98%  Weight:    107.5 kg  Height:  6\' 3"  (1.905 m)      Intake/Output Summary (Last 24 hours) at 11/24/2018 0810 Last data filed at 11/24/2018 0550 Gross per 24 hour  Intake 240 ml  Output 1675 ml  Net -1435 ml   Filed Weights   11/23/18 1251 11/24/18 0558  Weight: 109.8 kg 107.5 kg    Telemetry    Typical atrial flutter variable conduction, overall rate control during the night- Personally Reviewed  ECG    Typical atrial flutter- Personally Reviewed  Physical Exam   GEN: No acute distress.   Neck: No JVD Cardiac:  Irregular, no murmurs, rubs, or gallops.  Respiratory: Clear to auscultation bilaterally. GI: Soft, nontender, non-distended  MS: No edema; No deformity. Neuro:  Nonfocal  Psych: Normal affect   Labs    Chemistry Recent Labs  Lab 11/23/18 1306 11/23/18 2012  NA 134*  --   K 4.0  --   CL 104  --   CO2 22  --   GLUCOSE 100*  --   BUN 11  --   CREATININE 0.90  --   CALCIUM 9.1  --   PROT  --  5.9*  ALBUMIN  --  3.5  AST  --  30  ALT  --  14  ALKPHOS  --  50  BILITOT  --  0.7  GFRNONAA >60  --   GFRAA >60  --   ANIONGAP 8  --        Hematology Recent Labs  Lab 11/23/18 1306  WBC 7.8  RBC 5.08  HGB 15.3  HCT 45.8  MCV 90.2  MCH 30.1  MCHC 33.4  RDW 11.9  PLT 275    Cardiac Enzymes Recent Labs  Lab 11/23/18 1306 11/23/18 2012 11/24/18 0219  TROPONINI 0.04* 0.07* 0.06*   No results for input(s): TROPIPOC in the last 168 hours.   BNPNo results for input(s): BNP, PROBNP in the last 168 hours.   DDimer No results for input(s): DDIMER in the last 168 hours.   Radiology    Dg Chest 2 View  Result Date: 11/23/2018 CLINICAL DATA:  Shortness of breath. EXAM: CHEST - 2 VIEW COMPARISON:  None. FINDINGS: Bandlike density in left lower chest could represent overlying structures. This opacity is not clearly identified on the lateral view. Otherwise, the lungs are clear. Heart size is normal. No pleural effusions. Bone structures are unremarkable. IMPRESSION: Bandlike density in the left lower chest probably represents overlying structures. It appears that the patient is  scheduled for a chest CT and recommend attention to this area on follow-up chest CT. Electronically Signed   By: Adam  Henn M.D.   On: 11/23/2018 13:38   Ct Angio Chest Pe W And/or Wo Contrast  Result Date: 11/23/2018 CLINICAL DATA:  Shortness of breath EXAM: CT ANGIOGRAPHY CHEST WITH CONTRAST TECHNIQUE: Multidetector CT imaging of the chest was performed using the standard protocol during bolus administration of intravenous contrast. Multiplanar CT image reconstructions and MIPs were obtained to evaluate the vascular anatomy. CONTRAST:  100mL ISOVUE-370 IOPAMIDOL (ISOVUE-370) INJECTION 76% COMPARISON:  Chest x-ray from earlier today FINDINGS: Cardiovascular: Satisfactory opacification of the pulmonary arteries to the segmental level. No evidence of pulmonary embolism. Normal heart size. No pericardial effusion. Coronary atherosclerotic calcifications seen along the proximal LAD. Mediastinum/Nodes: Negative for adenopathy or mass Lungs/Pleura: There is  no edema, consolidation, effusion, or pneumothorax. Mild scarring at the left base. 3 mm ground-glass nodule in the right upper lobe on 7:36, below size threshold for follow-up per guidelines. Small bilateral calcified pulmonary nodules. Upper Abdomen: Negative Musculoskeletal: Bifid anterior right fifth rib Review of the MIP images confirms the above findings. IMPRESSION: 1. Negative for pulmonary embolism or other acute finding 2. Coronary atherosclerosis. Electronically Signed   By: Jonathon  Watts M.D.   On: 11/23/2018 14:33    Cardiac Studies   Echocardiogram pending  Patient Profile     57 y.o. male with prior history of DVTs, PE, on maintenance dose Xarelto 10 mg for prophylaxis who [facility]ness of breath, occasional dizziness, occasional chest sensation found to be in typical atrial flutter with rapid ventricular response.  Assessment & Plan    Typical atrial flutter - Plan is to give Xarelto today at around 1 PM.  This will be his second dose of high dose 20 mg Xarelto.  Remember he was on maintenance dose 10 mg previously for DVT.  On Friday, hopefully he will be able to have a TEE cardioversion.  I would like for him to get his Xarelto dose in the morning prior to this therefore he would have 3 total doses. - Likely continue with metoprolol after this.  Chronic anticoagulation - Xarelto 20 mg currently.  CHADSVASc is 1 for coronary artery calcification.  Technically, after 4 weeks post cardioversion, he could go back to his maintenance dose of 10 mg of Xarelto however we may consider continuing him on 20 mg for the time being just in case atrial flutter typical were to return.  This would allow for electrophysiology to plan on potential ablative therapy for his typical atrial flutter.  History of DVT/PE - Could be playing a role in the constellation of his symptoms as well.  No evidence of recent PE.  Demand ischemia -Low level  troponin elevation flat, not acute coronary syndrome.  For questions or updates, please contact [facility] Please consult [email or web address] for contact info under      Signed, Mark Skains, MD  11/24/2018, 8:10 AM    

## 2018-11-24 NOTE — Discharge Instructions (Signed)

## 2018-11-24 NOTE — Progress Notes (Signed)
  Echocardiogram 2D Echocardiogram has been performed.  Celene Skeen 11/24/2018, 1:05 PM

## 2018-11-24 NOTE — H&P (View-Only) (Signed)
Progress Note  Patient Name: Darin Garcia Date of Encounter: 11/24/2018  Primary Cardiologist: Donato Schultz, MD   Subjective   Slept well.  Went over symptoms.  Minimal substernal sensation.  Dizziness.  Difficulty breathing at times.  Inpatient Medications    Scheduled Meds: . metoprolol tartrate  25 mg Oral BID  . Rivaroxaban  20 mg Oral Q supper  . sodium chloride flush  3 mL Intravenous Q12H   Continuous Infusions: . sodium chloride     PRN Meds: sodium chloride, acetaminophen, albuterol, ondansetron (ZOFRAN) IV, sodium chloride flush   Vital Signs    Vitals:   11/23/18 1400 11/23/18 1718 11/23/18 2045 11/24/18 0558  BP: (!) 106/94 131/88 113/78 121/81  Pulse: 73 65 70 70  Resp: 19 20 11 16   Temp:  97.6 F (36.4 C) 97.8 F (36.6 C) 98.1 F (36.7 C)  TempSrc:  Oral Oral Oral  SpO2: 100%  98% 98%  Weight:    107.5 kg  Height:  6\' 3"  (1.905 m)      Intake/Output Summary (Last 24 hours) at 11/24/2018 0810 Last data filed at 11/24/2018 0550 Gross per 24 hour  Intake 240 ml  Output 1675 ml  Net -1435 ml   Filed Weights   11/23/18 1251 11/24/18 0558  Weight: 109.8 kg 107.5 kg    Telemetry    Typical atrial flutter variable conduction, overall rate control during the night- Personally Reviewed  ECG    Typical atrial flutter- Personally Reviewed  Physical Exam   GEN: No acute distress.   Neck: No JVD Cardiac:  Irregular, no murmurs, rubs, or gallops.  Respiratory: Clear to auscultation bilaterally. GI: Soft, nontender, non-distended  MS: No edema; No deformity. Neuro:  Nonfocal  Psych: Normal affect   Labs    Chemistry Recent Labs  Lab 11/23/18 1306 11/23/18 2012  NA 134*  --   K 4.0  --   CL 104  --   CO2 22  --   GLUCOSE 100*  --   BUN 11  --   CREATININE 0.90  --   CALCIUM 9.1  --   PROT  --  5.9*  ALBUMIN  --  3.5  AST  --  30  ALT  --  14  ALKPHOS  --  50  BILITOT  --  0.7  GFRNONAA >60  --   GFRAA >60  --   ANIONGAP 8  --        Hematology Recent Labs  Lab 11/23/18 1306  WBC 7.8  RBC 5.08  HGB 15.3  HCT 45.8  MCV 90.2  MCH 30.1  MCHC 33.4  RDW 11.9  PLT 275    Cardiac Enzymes Recent Labs  Lab 11/23/18 1306 11/23/18 2012 11/24/18 0219  TROPONINI 0.04* 0.07* 0.06*   No results for input(s): TROPIPOC in the last 168 hours.   BNPNo results for input(s): BNP, PROBNP in the last 168 hours.   DDimer No results for input(s): DDIMER in the last 168 hours.   Radiology    Dg Chest 2 View  Result Date: 11/23/2018 CLINICAL DATA:  Shortness of breath. EXAM: CHEST - 2 VIEW COMPARISON:  None. FINDINGS: Bandlike density in left lower chest could represent overlying structures. This opacity is not clearly identified on the lateral view. Otherwise, the lungs are clear. Heart size is normal. No pleural effusions. Bone structures are unremarkable. IMPRESSION: Bandlike density in the left lower chest probably represents overlying structures. It appears that the patient is  scheduled for a chest CT and recommend attention to this area on follow-up chest CT. Electronically Signed   By: Richarda Overlie M.D.   On: 11/23/2018 13:38   Ct Angio Chest Pe W And/or Wo Contrast  Result Date: 11/23/2018 CLINICAL DATA:  Shortness of breath EXAM: CT ANGIOGRAPHY CHEST WITH CONTRAST TECHNIQUE: Multidetector CT imaging of the chest was performed using the standard protocol during bolus administration of intravenous contrast. Multiplanar CT image reconstructions and MIPs were obtained to evaluate the vascular anatomy. CONTRAST:  ISOVUE-370 IOPAMIDOL (ISOVUE-370) INJECTION 76% COMPARISON:  Chest x-ray from earlier today FINDINGS: Cardiovascular: Satisfactory opacification of the pulmonary arteries to the segmental level. No evidence of pulmonary embolism. Normal heart size. No pericardial effusion. Coronary atherosclerotic calcifications seen along the proximal LAD. Mediastinum/Nodes: Negative for adenopathy or mass Lungs/Pleura: There is  no edema, consolidation, effusion, or pneumothorax. Mild scarring at the left base. 3 mm ground-glass nodule in the right upper lobe on 7:36, below size threshold for follow-up per guidelines. Small bilateral calcified pulmonary nodules. Upper Abdomen: Negative Musculoskeletal: Bifid anterior right fifth rib Review of the MIP images confirms the above findings. IMPRESSION: 1. Negative for pulmonary embolism or other acute finding 2. Coronary atherosclerosis. Electronically Signed   By: Marnee Spring M.D.   On: 11/23/2018 14:33    Cardiac Studies   Echocardiogram pending  Patient Profile     58 y.o. male with prior history of DVTs, PE, on maintenance dose Xarelto 10 mg for prophylaxis who presented to the emergency department at [facility] with shortness of breath, occasional dizziness, occasional chest sensation found to be in typical atrial flutter with rapid ventricular response.  Assessment & Plan    Typical atrial flutter - Plan is to give Xarelto today at around 1 PM.  This will be his second dose of high dose 20 mg Xarelto.  Remember he was on maintenance dose 10 mg previously for DVT.  On Friday, hopefully he will be able to have a TEE cardioversion.  I would like for him to get his Xarelto dose in the morning prior to this therefore he would have 3 total doses. - Likely continue with metoprolol after this.  Chronic anticoagulation - Xarelto 20 mg currently.  CHADSVASc is 1 for coronary artery calcification.  Technically, after 4 weeks post cardioversion, he could go back to his maintenance dose of 10 mg of Xarelto however we may consider continuing him on 20 mg for the time being just in case atrial flutter typical were to return.  This would allow for electrophysiology to plan on potential ablative therapy for his typical atrial flutter.  History of DVT/PE - Could be playing a role in the constellation of his symptoms as well.  No evidence of recent PE.  Demand ischemia -Low level  troponin elevation flat, not acute coronary syndrome.  For questions or updates, please contact [facility] Please consult [email or web address] for contact info under      Signed, Donato Schultz, MD  11/24/2018, 8:10 AM

## 2018-11-25 ENCOUNTER — Inpatient Hospital Stay (HOSPITAL_COMMUNITY): Payer: Managed Care, Other (non HMO) | Admitting: Registered Nurse

## 2018-11-25 ENCOUNTER — Ambulatory Visit (HOSPITAL_COMMUNITY): Payer: Managed Care, Other (non HMO)

## 2018-11-25 ENCOUNTER — Encounter (HOSPITAL_COMMUNITY)
Admission: EM | Disposition: A | Discharge status: Home / Self Care | Payer: Self-pay | Source: Home / Self Care | Attending: Cardiology

## 2018-11-25 ENCOUNTER — Encounter (HOSPITAL_COMMUNITY): Payer: Self-pay | Admitting: *Deleted

## 2018-11-25 DIAGNOSIS — I420 Dilated cardiomyopathy: Secondary | ICD-10-CM

## 2018-11-25 DIAGNOSIS — R7989 Other specified abnormal findings of blood chemistry: Secondary | ICD-10-CM

## 2018-11-25 DIAGNOSIS — R778 Other specified abnormalities of plasma proteins: Secondary | ICD-10-CM

## 2018-11-25 DIAGNOSIS — I4892 Unspecified atrial flutter: Secondary | ICD-10-CM

## 2018-11-25 DIAGNOSIS — I340 Nonrheumatic mitral (valve) insufficiency: Secondary | ICD-10-CM

## 2018-11-25 DIAGNOSIS — I429 Cardiomyopathy, unspecified: Secondary | ICD-10-CM

## 2018-11-25 HISTORY — PX: TEE WITHOUT CARDIOVERSION: SHX5443

## 2018-11-25 HISTORY — PX: CARDIOVERSION: SHX1299

## 2018-11-25 SURGERY — ECHOCARDIOGRAM, TRANSESOPHAGEAL
Anesthesia: Monitor Anesthesia Care

## 2018-11-25 MED ORDER — PROPOFOL 10 MG/ML IV BOLUS
INTRAVENOUS | Status: DC | PRN
  Administered 2018-11-25: 30 mg via INTRAVENOUS
  Administered 2018-11-25: 20 mg via INTRAVENOUS
  Administered 2018-11-25: 30 mg via INTRAVENOUS
  Administered 2018-11-25: 20 mg via INTRAVENOUS

## 2018-11-25 MED ORDER — LACTATED RINGERS IV SOLN
INTRAVENOUS | Status: DC | PRN
  Administered 2018-11-25: 11:00:00 via INTRAVENOUS

## 2018-11-25 MED ORDER — METOPROLOL SUCCINATE ER 25 MG PO TB24: 25.0000 mg | ORAL_TABLET | Freq: Every day | ORAL | Status: DC

## 2018-11-25 MED ORDER — RIVAROXABAN 20 MG PO TABS: 20.0000 mg | ORAL_TABLET | Freq: Every day | ORAL | 6 refills | Status: DC

## 2018-11-25 MED ORDER — PROPOFOL 500 MG/50ML IV EMUL
INTRAVENOUS | Status: DC | PRN
  Administered 2018-11-25: 100 ug/kg/min via INTRAVENOUS

## 2018-11-25 MED ORDER — LIDOCAINE 2% (20 MG/ML) 5 ML SYRINGE
INTRAMUSCULAR | Status: DC | PRN
  Administered 2018-11-25: 50 mg via INTRAVENOUS

## 2018-11-25 MED ORDER — METOPROLOL SUCCINATE ER 25 MG PO TB24: 25.0000 mg | ORAL_TABLET | Freq: Every day | ORAL | 6 refills | Status: DC

## 2018-11-25 MED FILL — XARELTO 20 MG TABLET: 20 | 30 days supply | Qty: 30 | Fill #0 | Status: TO

## 2018-11-25 MED FILL — METOPROLOL SUCCINATE ER 25: 25 | 30 days supply | Qty: 30 | Fill #0 | Status: TO

## 2018-11-25 NOTE — Anesthesia Preprocedure Evaluation (Signed)
Anesthesia Evaluation    Airway Mallampati: I       Dental no notable dental hx. (+) Teeth Intact   Pulmonary asthma , former smoker,    Pulmonary exam normal breath sounds clear to auscultation       Cardiovascular + Peripheral Vascular Disease   Rhythm:Irregular Rate:Normal     Neuro/Psych    GI/Hepatic GERD  ,  Endo/Other    Renal/GU      Musculoskeletal   Abdominal Normal abdominal exam  (+)   Peds  Hematology   Anesthesia Other Findings  ------------------------------------------------------------------- History:   PMH:  Atrial Flutter 427.32.  Atrial flutter.  ------------------------------------------------------------------- Study Conclusions  - Left ventricle: The cavity size was normal. Wall thickness was   increased in a pattern of mild LVH. Systolic function was mildly   reduced. The estimated ejection fraction was in the range of 45%   to 50%. Diffuse hypokinesis. The study is not technically   sufficient to allow evaluation of LV diastolic function. - Aortic root: The aortic root was mildly dilated. - Right ventricle: The cavity size was moderately dilated.  Impressions:  - Mild global reduction in LV systolic function; mild LVH; mildly   dilated aortic root; moderate RVE.   Reproductive/Obstetrics                             Anesthesia Physical Anesthesia Plan  ASA: III  Anesthesia Plan: General   Post-op Pain Management:    Induction:   PONV Risk Score and Plan:   Airway Management Planned: Mask and Natural Airway  Additional Equipment:   Intra-op Plan:   Post-operative Plan:   Informed Consent: I have reviewed the patients History and Physical, chart, labs and discussed the procedure including the risks, benefits and alternatives for the proposed anesthesia with the patient or authorized representative who has indicated his/her understanding and  acceptance.   Dental advisory given  Plan Discussed with: CRNA  Anesthesia Plan Comments:         Anesthesia Quick Evaluation

## 2018-11-25 NOTE — Progress Notes (Addendum)
    Transesophageal Echocardiogram Note  Darin Garcia [id number] [birth year]  Procedure: Transesophageal Echocardiogram Indications: atrial flutter  Procedure Details Consent: Obtained Time Out: Verified patient identification, verified procedure, site/side was marked, verified correct patient position, special equipment/implants available, Radiology Safety Procedures followed,  medications/allergies/relevent history reviewed, required imaging and test results available.  Performed  Medications:  Pt sedated by anesthesia with lidocaine 50 mg and diprovan 140 mg IV.   Mild to moderaate global reduction in LV systolic function; no LAA thrombus.  Pt subsequently had DCCV with 120 J to sinus bradycardia; no immediate complications; continue anticoagulation. Metoprolol DCed as HR in 40s following DCCV.   Complications: No apparent complications Patient did tolerate procedure well.  Olga Millers, MD

## 2018-11-25 NOTE — Progress Notes (Signed)
  Echocardiogram Echocardiogram Transesophageal has been performed.  Delcie Roch 11/25/2018, 11:45 AM

## 2018-11-25 NOTE — Anesthesia Procedure Notes (Signed)
Date/Time: 11/25/2018 11:15 AM Performed by: Laruth Bouchard., CRNA Pre-anesthesia Checklist: Patient identified, Emergency Drugs available, Suction available, Patient being monitored and Timeout performed Patient Re-evaluated:Patient Re-evaluated prior to induction Oxygen Delivery Method: Nasal cannula Induction Type: IV induction Placement Confirmation: positive ETCO2

## 2018-11-25 NOTE — Transfer of Care (Signed)
Immediate Anesthesia Transfer of Care Note  Patient: Darin Garcia  Procedure(s) Performed: TRANSESOPHAGEAL ECHOCARDIOGRAM (TEE) (N/A ) CARDIOVERSION (N/A )  Patient Location: PACU and Endoscopy Unit  Anesthesia Type:MAC  Level of Consciousness: awake, alert  and oriented  Airway & Oxygen Therapy: Patient Spontanous Breathing  Post-op Assessment: Report given to RN and Post -op Vital signs reviewed and stable  Post vital signs: Reviewed and stable  Last Vitals:  Vitals Value Taken Time  BP 94/67 11/25/2018 11:40 AM  Temp    Pulse 52 11/25/2018 11:42 AM  Resp 7 11/25/2018 11:42 AM  SpO2 97 % 11/25/2018 11:42 AM  Vitals shown include unvalidated device data.  Last Pain:  Vitals:   11/25/18 1033  TempSrc: Oral  PainSc: 0-No pain         Complications: No apparent anesthesia complications

## 2018-11-25 NOTE — Interval H&P Note (Signed)
History and Physical Interval Note:  11/25/2018 10:55 AM  Darin Garcia  has presented today for surgery, with the diagnosis of A-FLUTTER  The various methods of treatment have been discussed with the patient and family. After consideration of risks, benefits and other options for treatment, the patient has consented to  Procedure(s): TRANSESOPHAGEAL ECHOCARDIOGRAM (TEE) (N/A) CARDIOVERSION (N/A) as a surgical intervention .  The patient's history has been reviewed, patient examined, no change in status, stable for surgery.  I have reviewed the patient's chart and labs.  Questions were answered to the patient's satisfaction.     Olga Millers

## 2018-11-25 NOTE — Progress Notes (Signed)
Progress Note  Patient Name: Darin Garcia Date of Encounter: 11/25/2018  Primary Cardiologist:   Donato Schultz, MD   Subjective   No chest pain.  No SOB.   Inpatient Medications    Scheduled Meds: . rivaroxaban  20 mg Oral Q breakfast  . sodium chloride flush  3 mL Intravenous Q12H   Continuous Infusions: . sodium chloride     PRN Meds: sodium chloride, acetaminophen, albuterol, ondansetron (ZOFRAN) IV, sodium chloride flush   Vital Signs    Vitals:   11/25/18 1033 11/25/18 1140 11/25/18 1150 11/25/18 1200  BP: 123/88 94/67 97/68  103/75  Pulse: 93 (!) 58 (!) 54 (!) 57  Resp: 20 16 16 12   Temp: 97.8 F (36.6 C) 97.6 F (36.4 C)    TempSrc: Oral Oral    SpO2: 98% 98% 98% 97%  Weight:      Height:        Intake/Output Summary (Last 24 hours) at 11/25/2018 1434 Last data filed at 11/25/2018 1134 Gross per 24 hour  Intake 640 ml  Output 650 ml  Net -10 ml   Filed Weights   11/23/18 1251 11/24/18 0558 11/25/18 0516  Weight: 109.8 kg 107.5 kg 106.6 kg    Telemetry    NSR - Personally Reviewed  ECG    NA - Personally Reviewed  Physical Exam   GEN: No acute distress.   Neck: No  JVD Cardiac: RRR, no murmurs, rubs, or gallops.  Respiratory: Clear  to auscultation bilaterally. GI: Soft, nontender, non-distended  MS: No  edema; No deformity. Neuro:  Nonfocal  Psych: Normal affect   Labs    Chemistry Recent Labs  Lab 11/23/18 1306 11/23/18 2012  NA 134*  --   K 4.0  --   CL 104  --   CO2 22  --   GLUCOSE 100*  --   BUN 11  --   CREATININE 0.90  --   CALCIUM 9.1  --   PROT  --  5.9*  ALBUMIN  --  3.5  AST  --  30  ALT  --  14  ALKPHOS  --  50  BILITOT  --  0.7  GFRNONAA >60  --   GFRAA >60  --   ANIONGAP 8  --      Hematology Recent Labs  Lab 11/23/18 1306  WBC 7.8  RBC 5.08  HGB 15.3  HCT 45.8  MCV 90.2  MCH 30.1  MCHC 33.4  RDW 11.9  PLT 275    Cardiac Enzymes Recent Labs  Lab 11/23/18 1306 11/23/18 2012  11/24/18 0219 11/24/18 0749  TROPONINI 0.04* 0.07* 0.06* 0.05*   No results for input(s): TROPIPOC in the last 168 hours.   BNPNo results for input(s): BNP, PROBNP in the last 168 hours.   DDimer No results for input(s): DDIMER in the last 168 hours.   Radiology    No results found.  Cardiac Studies   TEE  Study Conclusions  - Left ventricle: Systolic function was mildly to moderately   reduced. The estimated ejection fraction was in the range of 40%   to 45%. Diffuse hypokinesis. - Aortic valve: No evidence of vegetation. - Mitral valve: No evidence of vegetation. There was mild   regurgitation. - Left atrium: No evidence of thrombus in the atrial cavity or   appendage. - Atrial septum: No defect or patent foramen ovale was identified. - Tricuspid valve: No evidence of vegetation. - Pulmonic valve: No evidence of vegetation.  Patient Profile     59 y.o. male with prior history of DVTs, PE, on maintenance dose Xarelto 10 mg for prophylaxis who presented to the emergency department at [facility] with shortness of breath, occasional dizziness, occasional chest sensation found to be in typical atrial flutter with rapid ventricular response.  Assessment & Plan    ATRIAL FLUTTER:   Cardioverted.  Home on Xarelto 20 mg x at least one month.       ELEVATED TROPONIN:  Thought to be demand ischemia.   No in patient ischemia work up.  Plan per Dr. Anne Fu as an out patient.    CARDIOMYOPATHY:  Mildly reduced EF per TEE.  Likely related to tachycardia but can be followed as an out patient.  I am going to start a low dose of beta blocker for his rhythm and his cardiomyopathy.  I alerted Dr. Anne Fu to the EF.    For questions or updates, please contact [facility] Please consult [email or web address] for contact info under Cardiology/STEMI.   Signed, Rollene Rotunda, MD  11/25/2018, 2:34 PM

## 2018-11-25 NOTE — Discharge Summary (Addendum)
Discharge Summary    Patient ID: Darin Garcia,  MRN: [id number], DOB/AGE: 57/21/1962 57 y.o.  Admit date: 11/23/2018 Discharge date: 11/25/2018  Primary Care Provider: Cheron Schaumann. Primary Cardiologist: Donato Schultz, MD Primary Electrophysiologist:  None  Discharge Diagnoses    Principal Problem:   Atrial flutter [facility]) Active Problems:   Cardiomyopathy [facility])   Elevated troponin  Diagnostic Studies/Procedures    Transesophageal Echocardiogram Note  Darin Garcia [id number] Jul 12, 1961  Procedure: Transesophageal Echocardiogram Indications: atrial flutter  Procedure Details Consent: Obtained Time Out: Verified patient identification, verified procedure, site/side was marked, verified correct patient position, special equipment/implants available, Radiology Safety Procedures followed,  medications/allergies/relevent history reviewed, required imaging and test results available.  Performed  Medications:  Pt sedated by anesthesia with lidocaine 50 mg and diprovan 140 mg IV.   Mild to moderaate global reduction in LV systolic function; no LAA thrombus.  Pt subsequently had DCCV with 120 J to sinus bradycardia; no immediate complications; continue anticoagulation. Metoprolol DCed as HR in 40s following DCCV.  Complications: No apparent complications Patient did tolerate procedure well.  Olga Millers, MD    _____________     History of Present Illness     Darin Garcia is a 58 y.o. 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 EKG demonstrated atrial flutter with rapid ventricular rate.  He was transferred to [facility] for further evaluation and treatment.  Hospital Course    CT angio demonstrated no PE or other acute finding, did show coronary  atherosclerosis. Troponins were minimally elevated 0.04-0.07-0.06-0.05 felt due to demand ischemia. TSH was normal. CBC was nonacute. In restrospect he reported he'd been feeling shortness of breath and dizziness for quite some time looking back on this.  He was attributing some of this to his prior history of PE. Dr. Anne Fu wondered if prior PE was causing some right atrial stretch due to elevated pulmonary pressures. He was started on metoprolol 25mg  BID. His Xarelto was increased to 20mg  daily in anticipation of TEE/DCCV. A surface 2D echo showed mild LVH, EF 45-50%, mildly dilated aortic root and moderately dilated RVE. He underwent DCCV with 120J to sinus bradycardia. His immediate post-DCCV HR was in the 40s so further metoprolol initially held. However, this afternoon it is currently in the 50s and Dr. Antoine Poche recommended sending home on low dose BB given his cardiomyopathy and rhythm. He feels further decisions regarding ischemic workup can be made as an outpatient in light of cardiomyopathy and coronary calcium. His blood pressure is too low to consider additional therapy at this time. The patient feels much better post DCCV and is not having any CP or dyspnea at discharge. Dr. Antoine Poche has seen and examined the patient today and feels he is stable for discharge.  From anticoagulation standpoint, Dr. Anne Fu states, "Technically, after 4 weeks post cardioversion, he could go back to his maintenance dose of 10 mg of Xarelto however we may consider continuing him on 20 mg for the time being just in case atrial flutter typical were to return.  This would allow for electrophysiology to plan on potential ablative therapy for his typical atrial flutter." This can be decided as OP depending on post-hospital course.  Regarding work, he works for [employer] and periodically travels. He plans on taking the next few days off, working from home next  week, and returning 12/05/18 to regular duties if feeling  well. _____________  Discharge Vitals Blood pressure 103/75, pulse (!) 57, temperature 97.6 F (36.4 C), temperature source Oral, resp. rate 12, height 6\' 3"  (1.905 m), weight 106.6 kg, SpO2 97 %.  Filed Weights   11/23/18 1251 11/24/18 0558 11/25/18 0516  Weight: 109.8 kg 107.5 kg 106.6 kg    Labs & Radiologic Studies    CBC Recent Labs    11/23/18 1306  WBC 7.8  HGB 15.3  HCT 45.8  MCV 90.2  PLT 275   Basic Metabolic Panel Recent Labs    65/46/50 1306  NA 134*  K 4.0  CL 104  CO2 22  GLUCOSE 100*  BUN 11  CREATININE 0.90  CALCIUM 9.1   Liver Function Tests Recent Labs    11/23/18 2012  AST 30  ALT 14  ALKPHOS 50  BILITOT 0.7  PROT 5.9*  ALBUMIN 3.5   No results for input(s): LIPASE, AMYLASE in the last 72 hours. Cardiac Enzymes Recent Labs    11/23/18 2012 11/24/18 0219 11/24/18 0749  TROPONINI 0.07* 0.06* 0.05*   BNP Invalid input(s): POCBNP D-Dimer No results for input(s): DDIMER in the last 72 hours. Hemoglobin A1C No results for input(s): HGBA1C in the last 72 hours. Fasting Lipid Panel Recent Labs    11/24/18 0219  CHOL 205*  HDL 56  LDLCALC 135*  TRIG 68  CHOLHDL 3.7   Thyroid Function Tests Recent Labs    11/23/18 2012  TSH 1.657   _____________  Dg Chest 2 View  Result Date: 11/23/2018 CLINICAL DATA:  Shortness of breath. EXAM: CHEST - 2 VIEW COMPARISON:  None. FINDINGS: Bandlike density in left lower chest could represent overlying structures. This opacity is not clearly identified on the lateral view. Otherwise, the lungs are clear. Heart size is normal. No pleural effusions. Bone structures are unremarkable. IMPRESSION: Bandlike density in the left lower chest probably represents overlying structures. It appears that the patient is scheduled for a chest CT and recommend attention to this area on follow-up chest CT. Electronically Signed   By: Richarda Overlie M.D.   On: 11/23/2018 13:38   Ct Angio Chest Pe W And/or Wo  Contrast  Result Date: 11/23/2018 CLINICAL DATA:  Shortness of breath EXAM: CT ANGIOGRAPHY CHEST WITH CONTRAST TECHNIQUE: Multidetector CT imaging of the chest was performed using the standard protocol during bolus administration of intravenous contrast. Multiplanar CT image reconstructions and MIPs were obtained to evaluate the vascular anatomy. CONTRAST:  ISOVUE-370 IOPAMIDOL (ISOVUE-370) INJECTION 76% COMPARISON:  Chest x-ray from earlier today FINDINGS: Cardiovascular: Satisfactory opacification of the pulmonary arteries to the segmental level. No evidence of pulmonary embolism. Normal heart size. No pericardial effusion. Coronary atherosclerotic calcifications seen along the proximal LAD. Mediastinum/Nodes: Negative for adenopathy or mass Lungs/Pleura: There is no edema, consolidation, effusion, or pneumothorax. Mild scarring at the left base. 3 mm ground-glass nodule in the right upper lobe on 7:36, below size threshold for follow-up per guidelines. Small bilateral calcified pulmonary nodules. Upper Abdomen: Negative Musculoskeletal: Bifid anterior right fifth rib Review of the MIP images confirms the above findings. IMPRESSION: 1. Negative for pulmonary embolism or other acute finding 2. Coronary atherosclerosis. Electronically Signed   By: Marnee Spring M.D.   On: 11/23/2018 14:33   Disposition   Pt is being discharged home today in good condition.  Follow-up Plans & Appointments    Follow-up Information    Leone Brand, NP Follow up.  Specialties:  Cardiology, Radiology Why:  [facility] - [address] location - see appointment below. Vernona Rieger is one of our nurse practitioners that works closely with Dr. Anne Fu. Contact information: [address] [place] [address] [phone]          Discharge Instructions    Diet - low sodium heart healthy   Complete by:  As directed    Increase activity slowly   Complete by:  As directed    See end of this  document for further information about cardioversion and atrial flutter. You should not drive for 24 hours after your cardioversion due to the sedation medicine.  If you notice any bleeding such as blood in stool, black tarry stools, blood in urine, nosebleeds or any other unusual bleeding, call your doctor immediately.  As discussed, you may return to working from home next week. If you are feeling well and wish to return to work on Monday 12/08/2018, you may do so. Let our office know if you need an updated work note to return to work.      Discharge Medications   Allergies as of 11/25/2018   No Known Allergies     Medication List    TAKE these medications   Fluticasone-Salmeterol 250-50 MCG/DOSE Aepb Commonly known as:  ADVAIR Inhale 1 puff into the lungs 2 (two) times daily as needed (shortness of breath and wheezing).   metoprolol succinate 25 MG 24 hr tablet Commonly known as:  TOPROL-XL Take 1 tablet (25 mg total) by mouth daily. Start taking on:  November 26, 2018   [facility] HFA 108 (90 Base) MCG/ACT inhaler Generic drug:  albuterol Inhale 2 puffs into the lungs every 6 (six) hours as needed for wheezing or shortness of breath.   rivaroxaban 20 MG Tabs tablet Commonly known as:  XARELTO Take 1 tablet (20 mg total) by mouth daily. What changed:    medication strength  how much to take  when to take this        Allergies:  No Known Allergies  Outstanding Labs/Studies   N/A  Duration of Discharge Encounter   Greater than 30 minutes including physician time.  Signed, Laurann Montana PA-C 11/25/2018, 3:37 PM

## 2018-11-25 NOTE — Anesthesia Postprocedure Evaluation (Signed)
Anesthesia Post Note  Patient: Darin Garcia  Procedure(s) Performed: TRANSESOPHAGEAL ECHOCARDIOGRAM (TEE) (N/A ) CARDIOVERSION (N/A )     Patient location during evaluation: Endoscopy Anesthesia Type: MAC Level of consciousness: sedated Pain management: pain level controlled Vital Signs Assessment: post-procedure vital signs reviewed and stable Respiratory status: spontaneous breathing Cardiovascular status: stable Postop Assessment: no apparent nausea or vomiting Anesthetic complications: no    Last Vitals:  Vitals:   11/25/18 1033 11/25/18 1140  BP: 123/88 94/67  Pulse: 93 (!) 58  Resp: 20 16  Temp: 36.6 C 36.4 C  SpO2: 98% 98%    Last Pain:  Vitals:   11/25/18 1140  TempSrc: Oral  PainSc: 0-No pain   Pain Goal:                 Huston Foley

## 2018-11-27 ENCOUNTER — Encounter (HOSPITAL_COMMUNITY): Payer: Self-pay | Admitting: Cardiology

## 2018-11-29 NOTE — Progress Notes (Signed)
Transitions of Care Follow Up Call Note  Darin Garcia is an 58 y.o. male who presented to [facility] on 11/23/2018.  The patient had the following prescriptions filled at [facility]: Xarelto, metoprolol succinate  Patient was called by pharmacist and HIPAA identifiers were verified. The following questions were asked about the prescriptions filled at [facility]:  Has the patient been experiencing any side effects to the medications prescribed? Yes - some dizziness (see comments below)  Understanding of regimen: good Understanding of indications: good Potential of compliance: good   Pharmacist comments: patient reports some dizziness, denies any falls. Instructed to watch carefully and call if this worsens or persists.    [x]  Patient's prescriptions filled at the [facility] were transferred to the following pharmacy: [facility] on [address]  []  Patient unable to be reached after calling three times and prescriptions filled at the [facility] were transferred to preferred pharmacy found within their chart.   [name] 11/29/2018, 6:04 PM Transitions of Care Pharmacy Hours: Monday - Friday 8:30am to 5:00 PM  Phone - [phone]

## 2018-12-07 ENCOUNTER — Encounter: Payer: Self-pay | Admitting: Cardiology

## 2018-12-07 NOTE — Progress Notes (Signed)
Cardiology Office Note   Date:  12/08/2018   ID:  Darin Garcia, DOB 1961-09-23, MRN [id number]  PCP:  Cheron Schaumann., MD  Cardiologist: Dr. Anne Fu    Chief Complaint  Patient presents with  . Hospitalization Follow-up    a flutter      History of Present Illness: Darin Garcia is a 58 y.o. male who presents for post hospitalization.   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 His troponin was mildly elevated at 0.05 to 0.07.  On CTA of chest he did have atherosclerosis of his LAD.  His EF was 45-50% thought to be from a fib.   D/c'd on Xaretlto 20 mg for at least one month then back to 10 mg daily.  Though may need the 20 mg for some time.  Will discuss with Dr. Anne Fu possible abaltion. cha2DS2VASc of 1.  Pt placed on toprol at discharge with decreased EF.  Since discharge he still  Has dyspnea walking up stairs, along with lightheadedness and dizziness.  No syncope.   He is anxious he will not return to his normal health. His HR is in the 40s with symptoms and on HR APP  It is difficult to tell if any a fib   Hx of bradycardia to 30s during hospitalizations in the past.     Past Medical History:  Diagnosis Date  . Atrial flutter (HCC)    a. s/p TEE DCCV 11/2018.  [name] Cardiomyopathy (HCC)   . Dyspnea   . GERD (gastroesophageal reflux disease)   . History of DVT (deep vein thrombosis)   . Pulmonary embolism [facility])     Past Surgical History:  Procedure Laterality Date  . CARDIOVERSION N/A 11/25/2018   Procedure: CARDIOVERSION;  Surgeon: Lewayne Bunting, MD;  Location: [facility] ENDOSCOPY;  Service: Cardiovascular;  Laterality: N/A;  . TEE WITHOUT CARDIOVERSION N/A 11/25/2018   Procedure: TRANSESOPHAGEAL ECHOCARDIOGRAM (TEE);  Surgeon: Lewayne Bunting, MD;  Location: [facility] ENDOSCOPY;   Service: Cardiovascular;  Laterality: N/A;     Current Outpatient Medications  Medication Sig Dispense Refill  . albuterol (PROAIR HFA) 108 (90 Base) MCG/ACT inhaler Inhale 2 puffs into the lungs every 6 (six) hours as needed for wheezing or shortness of breath.    . Fluticasone-Salmeterol (ADVAIR) 250-50 MCG/DOSE AEPB Inhale 1 puff into the lungs 2 (two) times daily as needed (shortness of breath and wheezing).     . rivaroxaban (XARELTO) 20 MG TABS tablet Take 1 tablet (20 mg total) by mouth daily. 30 tablet 6   No current facility-administered medications for this visit.     Allergies:   Patient has no known allergies.    Social History:  The patient  reports that he quit smoking about 25 years ago. His smoking use included cigarettes. He has a 4.00 pack-year smoking history. He has never used smokeless tobacco. He reports current alcohol use. He reports that he does not use drugs.   Family History:  The patient's family history includes CAD in his sister; Pulmonary embolism in his father.    ROS:  General:no colds or fevers, no weight changes Skin:no rashes or ulcers HEENT:no blurred vision, no congestion CV:see HPI PUL:see HPI GI:no diarrhea constipation or melena, no indigestion GU:no hematuria, no dysuria MS:no joint pain, no claudication Neuro:no syncope, +  lightheadedness Endo:no diabetes, no thyroid disease  Wt Readings from Last 3 Encounters:  12/08/18 244 lb 12.8 oz (111 kg)  11/25/18 235 lb 1.6 oz (106.6 kg)  04/21/18 246 lb (111.6 kg)     PHYSICAL EXAM: VS:  BP 128/76   Pulse (!) 58   Ht 6\' 3"  (1.905 m)   Wt 244 lb 12.8 oz (111 kg)   SpO2 100%   BMI 30.60 kg/m  , BMI Body mass index is 30.6 kg/m. General:Pleasant affect, NAD Skin:Warm and dry, brisk capillary refill HEENT:normocephalic, sclera clear, mucus membranes moist Neck:supple, no JVD, no bruits  Heart:S1S2 RRR without murmur, gallup, rub or click Lungs:clear without rales, rhonchi, or  wheezes RJJ:OACZ, non tender, + BS, do not palpate liver spleen or masses Ext:no lower ext edema, 2+ pedal pulses, 2+ radial pulses Neuro:alert and oriented X 3, MAE, follows commands, + facial symmetry    EKG:  EKG is ordered today. The ekg ordered today demonstrates SB at 1 though possible ectopic atrial bradycardia no acute ST cahnges.    Recent Labs: 11/23/2018: ALT 14; BUN 11; Creatinine, Ser 0.90; Hemoglobin 15.3; Platelets 275; Potassium 4.0; Sodium 134; TSH 1.657    Lipid Panel    Component Value Date/Time   CHOL 205 (H) 11/24/2018 0219   TRIG 68 11/24/2018 0219   HDL 56 11/24/2018 0219   CHOLHDL 3.7 11/24/2018 0219   VLDL 14 11/24/2018 0219   LDLCALC 135 (H) 11/24/2018 0219       Other studies Reviewed: Additional studies/ records that were reviewed today include:  Echo. Study Conclusions  - Left ventricle: The cavity size was normal. Wall thickness was   increased in a pattern of mild LVH. Systolic function was mildly   reduced. The estimated ejection fraction was in the range of 45%   to 50%. Diffuse hypokinesis. The study is not technically   sufficient to allow evaluation of LV diastolic function. - Aortic root: The aortic root was mildly dilated. - Right ventricle: The cavity size was moderately dilated.  Impressions:  - Mild global reduction in LV systolic function; mild LVH; mildly   dilated aortic root; moderate RVE.  TEE Study Conclusions  - Left ventricle: Systolic function was mildly to moderately   reduced. The estimated ejection fraction was in the range of 40%   to 45%. Diffuse hypokinesis. - Aortic valve: No evidence of vegetation. - Mitral valve: No evidence of vegetation. There was mild   regurgitation. - Left atrium: No evidence of thrombus in the atrial cavity or   appendage. - Atrial septum: No defect or patent foramen ovale was identified. - Tricuspid valve: No evidence of vegetation. - Pulmonic valve: No evidence of  vegetation.  Impressions:  - Mild to moderate global LV dysfunction; no LAA thrombus; mild MR   and TR.  ASSESSMENT AND PLAN:  1.  PAFl. s/p TEE and DCCV.  No chest pain but continues with similar symptoms as admit.  Will have pt wear 30 day event monitor. To have better arrhythmia eval.  He is bradycardic I discussed with Dr. Excell Seltzer and will stop BB - he has hx of brady but now symptomatic.  Continue Xarelto at 20 mg for now.  Will check with Dr. Anne Fu on timing of EP consult.    2.  Elevated troponin, coronary atherosclerosis on CTA of chest  And mild decrease in ER.  With continued symptoms which may be related to bradycardia but will proceed with exercise myoview.  The troponin and  decrease of EF may all be due to a flutter.   Not on a statin and LDL of 135 in hospital.  Will add statin depending on stress test.  He will follow up with Dr. Anne Fu.      3.  Hx of DVT and PE on xarelto 10 mg prior to a flutter, will keep on 20 mg for at least 4 weeks post DCCV then will defer to Dr. Anne Fu for further recommendations.  No bleeding.      4.  CM thought to be due to a flutter.     Current medicines are reviewed with the patient today.  The patient Has no concerns regarding medicines.  The following changes have been made:  See above Labs/ tests ordered today include:see above  Disposition:   FU:  see above  Signed, Nada Boozer, NP  12/08/2018 10:09 PM    [facility] [address] [place], [place], [place]  [address]/ [address] [place], [place] Phone: [phone]; Fax: [phone]  [phone]

## 2018-12-08 ENCOUNTER — Encounter: Payer: Self-pay | Admitting: Cardiology

## 2018-12-08 ENCOUNTER — Encounter: Payer: Self-pay | Admitting: *Deleted

## 2018-12-08 ENCOUNTER — Ambulatory Visit (INDEPENDENT_AMBULATORY_CARE_PROVIDER_SITE_OTHER): Payer: Managed Care, Other (non HMO) | Admitting: Cardiology

## 2018-12-08 VITALS — BP 128/76 | HR 58 | Ht 75.0 in | Wt 244.8 lb

## 2018-12-08 DIAGNOSIS — I4892 Unspecified atrial flutter: Secondary | ICD-10-CM | POA: Diagnosis not present

## 2018-12-08 DIAGNOSIS — I495 Sick sinus syndrome: Secondary | ICD-10-CM

## 2018-12-08 DIAGNOSIS — Z86711 Personal history of pulmonary embolism: Secondary | ICD-10-CM

## 2018-12-08 DIAGNOSIS — I2510 Atherosclerotic heart disease of native coronary artery without angina pectoris: Secondary | ICD-10-CM

## 2018-12-08 DIAGNOSIS — R7989 Other specified abnormal findings of blood chemistry: Secondary | ICD-10-CM

## 2018-12-08 DIAGNOSIS — R778 Other specified abnormalities of plasma proteins: Secondary | ICD-10-CM

## 2018-12-08 NOTE — Patient Instructions (Addendum)
Medication Instructions:   STOP TAKING METOPROLOL  SUCCINATE   If you need a refill on your cardiac medications before your next appointment, please call your pharmacy.   Lab work:  If you have labs (blood work) drawn today and your tests are completely normal, you will receive your results only by: [name] MyChart Message (if you have MyChart) OR . A paper copy in the mail If you have any lab test that is abnormal or we need to change your treatment, we will call you to review the results.  Testing/Procedures: Your physician has recommended that you wear an event monitor. Event monitors are medical devices that record the heart's electrical activity. Doctors most often [place] these monitors to diagnose arrhythmias. Arrhythmias are problems with the speed or rhythm of the heartbeat. The monitor is a small, portable device. You can wear one while you do your normal daily activities. This is usually used to diagnose what is causing palpitations/syncope (passing out).  Your physician has requested that you have en exercise stress myoview. For further information please visit [email or web address] Please follow instruction sheet, as given.    Follow-Up:  WITH DR Anne Fu IN 3 TO 4 WEEKS      Any Other Special Instructions Will Be Listed Below (If Applicable).

## 2018-12-14 ENCOUNTER — Telehealth (HOSPITAL_COMMUNITY): Payer: Self-pay | Admitting: *Deleted

## 2018-12-14 ENCOUNTER — Telehealth (HOSPITAL_COMMUNITY): Payer: Self-pay | Admitting: Radiology

## 2018-12-14 NOTE — Telephone Encounter (Signed)
Left message on voicemail in reference to upcoming appointment scheduled for 12/20/18. Phone number given for a call back so details instructions can be given. Nicosha Struve, Adelene Idler

## 2018-12-14 NOTE — Telephone Encounter (Signed)
Patient given detailed instructions per Myocardial Perfusion Study Information Sheet for the test on 12/20/2018 at 8:00. Patient notified to arrive 15 minutes early and that it is imperative to arrive on time for appointment to keep from having the test rescheduled.  If you need to cancel or reschedule your appointment, please call the office within 24 hours of your appointment. . Patient verbalized understanding.EHK

## 2018-12-14 NOTE — Addendum Note (Signed)
Addended by: Burnetta Sabin on: 12/14/2018 12:54 PM   Modules accepted: Orders

## 2018-12-20 ENCOUNTER — Ambulatory Visit (HOSPITAL_COMMUNITY): Payer: Managed Care, Other (non HMO) | Attending: Cardiovascular Disease

## 2018-12-20 ENCOUNTER — Other Ambulatory Visit: Payer: Self-pay | Admitting: Cardiology

## 2018-12-20 ENCOUNTER — Ambulatory Visit (INDEPENDENT_AMBULATORY_CARE_PROVIDER_SITE_OTHER): Payer: Managed Care, Other (non HMO)

## 2018-12-20 DIAGNOSIS — I495 Sick sinus syndrome: Secondary | ICD-10-CM

## 2018-12-20 DIAGNOSIS — I2510 Atherosclerotic heart disease of native coronary artery without angina pectoris: Secondary | ICD-10-CM | POA: Diagnosis not present

## 2018-12-20 DIAGNOSIS — I4892 Unspecified atrial flutter: Secondary | ICD-10-CM

## 2018-12-20 DIAGNOSIS — R778 Other specified abnormalities of plasma proteins: Secondary | ICD-10-CM

## 2018-12-20 DIAGNOSIS — R7989 Other specified abnormal findings of blood chemistry: Secondary | ICD-10-CM

## 2018-12-20 LAB — MYOCARDIAL PERFUSION IMAGING
Estimated workload: 11.7 METS
Exercise duration (min): 10 min
LV dias vol: 110 mL (ref 62–150)
LV sys vol: 50 mL
MPHR: 164 {beats}/min
Peak HR: 157 {beats}/min
Percent HR: 95 %
RPE: 18
Rest HR: 49 {beats}/min
SDS: 2
SRS: 0
SSS: 2
TID: 0.94

## 2018-12-20 MED ORDER — TECHNETIUM TC 99M TETROFOSMIN IV KIT
10.6000 | PACK | Freq: Once | INTRAVENOUS | Status: AC | PRN
  Administered 2018-12-20: 10.6 via INTRAVENOUS
  Filled 2018-12-20: qty 11

## 2018-12-20 MED ORDER — TECHNETIUM TC 99M TETROFOSMIN IV KIT
32.5000 | PACK | Freq: Once | INTRAVENOUS | Status: AC | PRN
  Administered 2018-12-20: 32.5 via INTRAVENOUS
  Filled 2018-12-20: qty 33

## 2018-12-21 ENCOUNTER — Telehealth: Payer: Self-pay | Admitting: *Deleted

## 2018-12-21 ENCOUNTER — Telehealth: Payer: Self-pay | Admitting: Cardiology

## 2018-12-21 NOTE — Telephone Encounter (Signed)
Opened in error. See previous phone note. 

## 2018-12-21 NOTE — Telephone Encounter (Signed)
New Message    Patient returning [facility] call about stress test results.

## 2018-12-21 NOTE — Telephone Encounter (Signed)
-----   Message from Leone Brand, NP sent at 12/21/2018  7:54 AM EST ----- Great stress test, no ischemia or lack of blood supply.  We still needed him to see EP going forward if any further a flutter/fib then need a plan for treatment with his underlying slow HR.

## 2018-12-21 NOTE — Telephone Encounter (Signed)
Called pt re: stress test results, left a message for him to call back.  

## 2018-12-21 NOTE — Telephone Encounter (Signed)
-----   Message from Laura R Ingold, NP sent at 12/21/2018  7:54 AM EST ----- Great stress test, no ischemia or lack of blood supply.  We still needed him to see EP going forward if any further a flutter/fib then need a plan for treatment with his underlying slow HR. 

## 2018-12-21 NOTE — Telephone Encounter (Signed)
Returned pts call and he has been made aware of his stress test results. See result note. 

## 2018-12-26 ENCOUNTER — Telehealth: Payer: Self-pay | Admitting: *Deleted

## 2018-12-26 NOTE — Telephone Encounter (Signed)
Received report from [employer] dated 12/24/2018 at 6:46 AM where a 10 beat run of Ventricular Tachycardia was noted.  Per Darin Garcia, he was driving at that time and did not have any symptoms.  Will have Dr Anne Fu to review.

## 2018-12-27 NOTE — Telephone Encounter (Signed)
Personally reviewed this report.  Given the same axis as negative QRS, this could be PAT with aberrancy.  Asymptomatic regardless.  We will continue to monitor. Donato Schultz, MD

## 2018-12-28 ENCOUNTER — Telehealth: Payer: Self-pay

## 2018-12-28 NOTE — Telephone Encounter (Signed)
Received critical monitor strip.  Pt with V-tach run of 8 beats on 12/27/2018 at 10:27 pm  Per Pt he was asleep at that time.  But he states he has intermittent episodes of chest tightness.   Pt had low risk stress test.  Discussed ventricular tachycardia with Pt.  Advised Pt was being closely monitored as we gather additional information on his heart patterns.   Provided reassurance that if Pt needed to be seen sooner than scheduled he would be.  Pt quite anxious.  Upset he has not seen "an actual doctor yet only an NP". Advised all of our providers were very knowledgable and he was in good hands.   He feels that we are not communicating enough with him.  Advised we would call Pt with every alert strip sent to [place].  No further action needed at this time.  Pt will continue to be monitored.

## 2018-12-28 NOTE — Telephone Encounter (Signed)
Received critical monitor report from 12/27/2018 at 10:27 pm.  Pt had possible run of V-tach 8 beats with 2 PVC's in 1 minute.  Left detailed message requesting call back to confirm Pt asymptomatic and asleep at that time.  Left this nurse name and # for call back.

## 2019-01-03 ENCOUNTER — Ambulatory Visit: Payer: Managed Care, Other (non HMO) | Admitting: Cardiology

## 2019-01-17 ENCOUNTER — Telehealth: Payer: Self-pay | Admitting: Cardiology

## 2019-01-17 NOTE — Telephone Encounter (Signed)
Called by event service that pt was in a flutter - I called pt and he could feel he was in a flutter.  He still has metoprolol succinate 25 mg so he will take half a tab.  But if it does not slow he should come to ER.  HR is about 150.  He sounds SOB on the phone.  He is on Xarelto.  Pt is agreeable with this plan.

## 2019-01-18 NOTE — Telephone Encounter (Signed)
Received reading from this morning. Patient is Sinus Bradycardia at this time with HR 58. Informed patient to keep his appointment with Nada Boozer NP. Patient verbalized understanding and will keep appointment.

## 2019-01-18 NOTE — Progress Notes (Signed)
Cardiology Office Note   Date:  01/20/2019   ID:  Darin Garcia, DOB Feb 14, 1961, MRN [id number]  PCP:  Darin Garcia., MD  Cardiologist:  Darin Garcia   Chief Complaint  Patient presents with  . Atrial Flutter      History of Present Illness: Darin Garcia is a 58 y.o. male who presents for follow up of tests and flutter.  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 His troponin was mildly elevated at 0.05 to 0.07.  On CTA of chest he did have atherosclerosis of his LAD.  His EF was 45-50% thought to be from a fib.   D/c'd on Xaretlto 20 mg for at least one month then back to 10 mg daily.  Though may need the 20 mg for some time.  Will discuss with Darin Garcia possible abaltion. cha2DS2VASc of 1.  Pt placed on toprol at discharge with decreased EF.  Since discharge he still  Has dyspnea walking up stairs, along with lightheadedness and dizziness.  No syncope.   He is anxious he will not return to his normal health. His HR is in the 40s with symptoms and on HR APP  It is difficult to tell if any a fib   Hx of bradycardia to 30s during hospitalizations in the past.   I discussed with DOD and we did stress test, which was normal and added event monitor -  Also stopped BB.  topril XL 25 mg dialy.    This week he hadepisode of a flutter Tuesday pm HR up to 150, resumed toprol 12.5 daily. And took 2 extra 12.5 mg -  Wed HR 58  Today. HR 60 -  Overall he had been feeling well after we stopped BB. Recently he was beginning to have more dyspnea with exertion.     Past Medical History:  Diagnosis Date  . Atrial flutter (HCC)    a. s/p TEE DCCV 11/2018.  [name] Cardiomyopathy (HCC)   . Dyspnea   . GERD (gastroesophageal reflux disease)   . History of DVT (deep vein thrombosis)   . Pulmonary  embolism [facility])     Past Surgical History:  Procedure Laterality Date  . CARDIOVERSION N/A 11/25/2018   Procedure: CARDIOVERSION;  Surgeon: Darin Bunting, MD;  Location: [facility] ENDOSCOPY;  Service: Cardiovascular;  Laterality: N/A;  . TEE WITHOUT CARDIOVERSION N/A 11/25/2018   Procedure: TRANSESOPHAGEAL ECHOCARDIOGRAM (TEE);  Surgeon: Darin Bunting, MD;  Location: [facility] ENDOSCOPY;  Service: Cardiovascular;  Laterality: N/A;     Current Outpatient Medications  Medication Sig Dispense Refill  . Fluticasone-Salmeterol (ADVAIR) 250-50 MCG/DOSE AEPB Inhale 1 puff into the lungs 2 (two) times daily as needed (shortness of breath and wheezing).     . metoprolol succinate (TOPROL-XL) 25 MG 24 hr tablet Take 12.5 mg by mouth as needed (you may take half a tablet for break through A flutter).    . rivaroxaban (XARELTO) 20 MG TABS tablet Take 1 tablet (20 mg total) by mouth daily. 30 tablet 6  . pindolol (VISKEN) 5 MG tablet Take 0.5 tablets (2.5 mg total) by mouth 2 (two) times daily. 45 tablet 3   No current facility-administered medications for this visit.     Allergies:   Patient has no known allergies.    Social History:  The patient  reports that he quit smoking about 25 years ago. His smoking use included cigarettes. He has a 4.00 pack-year smoking history. He has never used smokeless tobacco. He reports current alcohol use. He reports that he does not use drugs.   Family History:  The patient's family history includes CAD in his sister; Pulmonary embolism in his father.    ROS:  General:no colds or fevers, no weight changes Skin:no rashes or ulcers HEENT:no blurred vision, no congestion CV:see HPI PUL:see HPI GI:no diarrhea constipation or melena, no indigestion GU:no hematuria, no dysuria MS:no joint pain, no claudication Neuro:no syncope, no lightheadedness Endo:no diabetes, no thyroid disease  Wt Readings from Last 3 Encounters:  01/19/19 247 lb 1.9 oz (112.1 kg)  12/20/18 244  lb (110.7 kg)  12/08/18 244 lb 12.8 oz (111 kg)     PHYSICAL EXAM: VS:  BP 120/78   Pulse 60   Ht 6\' 3"  (1.905 m)   Wt 247 lb 1.9 oz (112.1 kg)   SpO2 97%   BMI 30.89 kg/m  , BMI Body mass index is 30.89 kg/m. General:Pleasant affect, NAD Skin:Warm and dry, brisk capillary refill HEENT:normocephalic, sclera clear, mucus membranes moist Neck:supple, no JVD, no bruits  Heart:S1S2 RRR without murmur, gallup, rub or click Lungs:clear without rales, rhonchi, or wheezes FYB:OFBP, non tender, + BS, do not palpate liver spleen or masses Ext:no lower ext edema, 2+ pedal pulses, 2+ radial pulses Neuro:alert and oriented, MAE, follows commands, + facial symmetry    EKG:  EKG is NOT ordered today.   Recent Labs: 11/23/2018: ALT 14; BUN 11; Creatinine, Ser 0.90; Hemoglobin 15.3; Platelets 275; Potassium 4.0; Sodium 134; TSH 1.657    Lipid Panel    Component Value Date/Time   CHOL 205 (H) 11/24/2018 0219   TRIG 68 11/24/2018 0219   HDL 56 11/24/2018 0219   CHOLHDL 3.7 11/24/2018 0219   VLDL 14 11/24/2018 0219   LDLCALC 135 (H) 11/24/2018 0219       Other studies Reviewed: Additional studies/ records that were reviewed today include: . Stress test  Study Highlights    Nuclear stress EF: 54%. The left ventricular ejection fraction is normal  There was no ST segment deviation noted during stress.  The patient walked for a total of 10 minutes on a Bruce protocol treadmill test. He achieved a peak heart rate of 157 which is 95% predicted maximal heart rate.  There were no ST or T wave changes to suggest ischemia.  His blood pressure response to exercise was normal.  This is a low risk study. There is no evidence of ischemia and no evidence of previous infarction  The study is normal.     TEE 11/25/18 Study Conclusions  - Left ventricle: Systolic function was mildly to moderately   reduced. The estimated ejection fraction was in the range of 40%   to 45%. Diffuse  hypokinesis. - Aortic valve: No evidence of vegetation. - Mitral valve: No evidence of vegetation. There was mild   regurgitation. - Left atrium: No evidence of thrombus in the atrial cavity or   appendage. - Atrial septum: No defect or patent foramen ovale was identified. - Tricuspid valve: No evidence of vegetation. - Pulmonic valve: No evidence of vegetation.  Impressions:  - Mild to moderate global LV dysfunction; no LAA thrombus; mild MR   and TR.  Echo 11/24/18 Study Conclusions  - Left ventricle: The cavity size was normal. Wall thickness was   increased in a pattern  of mild LVH. Systolic function was mildly   reduced. The estimated ejection fraction was in the range of 45%   to 50%. Diffuse hypokinesis. The study is not technically   sufficient to allow evaluation of LV diastolic function. - Aortic root: The aortic root was mildly dilated. - Right ventricle: The cavity size was moderately dilated.  Impressions:  - Mild global reduction in LV systolic function; mild LVH; mildly   dilated aortic root; moderate RVE.  ASSESSMENT AND PLAN:  1.  PAFL off BB he has had one episode,.resolved with BB, but with symptomatic bradycardia concern for adding toprol back. Talked with Dr. Mayford Knife DOD,  Will place on pindolol 2.5 mg BID.  He is to see Dr. Johney Frame in March for possible ablation vs medication management.   Results of 30 day event not yet back.  2.  Anticoagulation with hx of DVT and PE prior to a flutter he was on xarelto 10 mg, for now will continue 20 mg daily.    3.  Elevated troponin with hospitalization due to rapid a flutter.  Stress done and neg for ischemia.   4.  Mild LV dysfunction. Thought to be due a flutter.   Current medicines are reviewed with the patient today.  The patient Has no concerns regarding medicines.  The following changes have been made:  See above Labs/ tests ordered today include:see above  Disposition:   Garcia:  see  above  Signed, Nada Boozer, NP  01/20/2019 10:29 PM    [facility] [address] [place], [place], [place]  [address]/ [address] [employer] [place], [place] Phone: [phone]; Fax: [phone]  [phone]

## 2019-01-18 NOTE — Telephone Encounter (Signed)
New Message   Patient returning Pam's phone call.

## 2019-01-18 NOTE — Telephone Encounter (Signed)
Received two cardiac reports from patient's monitor from yesterday.  1st monitor at 4:32 pm on 01/17/19 reported Atrial Flutter with variable conduction, sinus rhythm.  2nd monitor at 5:53 pm on 01/17/19 reported Atrial Flutter RVR sustained w/ run of V-Tach (4 beats)/ Couplet PVCs/MF PVCs (12 in 1 min).  Nada Boozer NP talked to patient last night and advised him to take metoprolol succinate 12.5 mg and if his HR did not slow then for him to go to the ED. Unable to reach patient this morning to ask him how he is doing at this time. Left message for patient to call back.

## 2019-01-18 NOTE — Telephone Encounter (Signed)
Called patient about his monitor. Patient stated he is feeling better then he did last night. Patient stated he took metoprolol 12.5 mg last night and took another metoprolol 12.5 mg this morning. Asked patient to send a reading on the monitor so we could see how he is doing this morning to see if he needs to go to ED.

## 2019-01-19 ENCOUNTER — Ambulatory Visit (INDEPENDENT_AMBULATORY_CARE_PROVIDER_SITE_OTHER): Payer: Managed Care, Other (non HMO) | Admitting: Cardiology

## 2019-01-19 ENCOUNTER — Encounter: Payer: Self-pay | Admitting: Cardiology

## 2019-01-19 VITALS — BP 120/78 | HR 60 | Ht 75.0 in | Wt 247.1 lb

## 2019-01-19 DIAGNOSIS — Z86711 Personal history of pulmonary embolism: Secondary | ICD-10-CM

## 2019-01-19 DIAGNOSIS — R7989 Other specified abnormal findings of blood chemistry: Secondary | ICD-10-CM | POA: Diagnosis not present

## 2019-01-19 DIAGNOSIS — I495 Sick sinus syndrome: Secondary | ICD-10-CM | POA: Diagnosis not present

## 2019-01-19 DIAGNOSIS — I4892 Unspecified atrial flutter: Secondary | ICD-10-CM | POA: Diagnosis not present

## 2019-01-19 DIAGNOSIS — R778 Other specified abnormalities of plasma proteins: Secondary | ICD-10-CM

## 2019-01-19 MED ORDER — PINDOLOL 5 MG PO TABS: 2.5000 mg | ORAL_TABLET | Freq: Two times a day (BID) | ORAL | 3 refills | Status: DC

## 2019-01-19 NOTE — Patient Instructions (Addendum)
Medication Instructions:  1.) START: Pindolol 2.5 mg twice a day  2.) You may take half a tablet of your metoprolol for break through A flutter  If you need a refill on your cardiac medications before your next appointment, please call your pharmacy.   Lab work: None  If you have labs (blood work) drawn today and your tests are completely normal, you will receive your results only by: [name] MyChart Message (if you have MyChart) OR . A paper copy in the mail If you have any lab test that is abnormal or we need to change your treatment, we will call you to review the results.  Testing/Procedures: None  Follow-Up: You are scheduled to see Dr. Johney Frame on 01/30/2019 @ 9:00 AM  Any Other Special Instructions Will Be Listed Below (If Applicable).

## 2019-01-20 ENCOUNTER — Encounter: Payer: Self-pay | Admitting: Cardiology

## 2019-01-30 ENCOUNTER — Ambulatory Visit (INDEPENDENT_AMBULATORY_CARE_PROVIDER_SITE_OTHER): Payer: Managed Care, Other (non HMO) | Admitting: Internal Medicine

## 2019-01-30 ENCOUNTER — Encounter: Payer: Self-pay | Admitting: Internal Medicine

## 2019-01-30 VITALS — BP 116/72 | HR 59 | Ht 75.0 in | Wt 243.4 lb

## 2019-01-30 DIAGNOSIS — I495 Sick sinus syndrome: Secondary | ICD-10-CM

## 2019-01-30 DIAGNOSIS — I4892 Unspecified atrial flutter: Secondary | ICD-10-CM

## 2019-01-30 DIAGNOSIS — I2510 Atherosclerotic heart disease of native coronary artery without angina pectoris: Secondary | ICD-10-CM | POA: Diagnosis not present

## 2019-01-30 LAB — BASIC METABOLIC PANEL
BUN/Creatinine Ratio: 10 (ref 9–20)
BUN: 10 mg/dL (ref 6–24)
CO2: 24 mmol/L (ref 20–29)
Calcium: 9.5 mg/dL (ref 8.7–10.2)
Chloride: 99 mmol/L (ref 96–106)
Creatinine, Ser: 1.01 mg/dL (ref 0.76–1.27)
GFR calc Af Amer: 96 mL/min/{1.73_m2} (ref >59)
GFR calc non Af Amer: 83 mL/min/{1.73_m2} (ref >59)
Glucose: 60 mg/dL — ABNORMAL LOW (ref 65–99)
Potassium: 5 mmol/L (ref 3.5–5.2)
Sodium: 138 mmol/L (ref 134–144)

## 2019-01-30 LAB — CBC
Hematocrit: 44.3 % (ref 37.5–51.0)
Hemoglobin: 15.4 g/dL (ref 13.0–17.7)
MCH: 30.7 pg (ref 26.6–33.0)
MCHC: 34.8 g/dL (ref 31.5–35.7)
MCV: 88 fL (ref 79–97)
Platelets: 258 10*3/uL (ref 150–450)
RBC: 5.01 x10E6/uL (ref 4.14–5.80)
RDW: 12.6 % (ref 11.6–15.4)
WBC: 5.7 10*3/uL (ref 3.4–10.8)

## 2019-01-30 NOTE — Patient Instructions (Signed)
Medication Instructions:  Your physician recommends that you continue on your current medications as directed. Please refer to the Current Medication list given to you today.  Labwork: You will have labs drawn today: CBC, BMP  Testing/Procedures: Your physician has recommended that you have an ablation. Catheter ablation is a medical procedure used to treat some cardiac arrhythmias (irregular heartbeats). During catheter ablation, a long, thin, flexible tube is put into a blood vessel in your groin (upper thigh), or neck. This tube is called an ablation catheter. It is then guided to your heart through the blood vessel. Radio frequency waves destroy small areas of heart tissue where abnormal heartbeats may cause an arrhythmia to start. Please see the instruction sheet given to you today.   Follow-Up: Your physician recommends that you schedule a follow-up appointment in:    4 weeks from March 24 with Dr Johney Frame for a post procedure follow up.  Any Other Special Instructions Will Be Listed Below (If Applicable).     If you need a refill on your cardiac medications before your next appointment, please call your pharmacy.

## 2019-01-30 NOTE — Progress Notes (Signed)
Electrophysiology Office Note   Date:  01/30/2019   ID:  Darin Garcia, DOB 1961/08/13, MRN [id number]  PCP:  Cheron Schaumann., MD  Cardiologist:  Dr Anne Fu Primary Electrophysiologist: Hillis Range, MD    CC: atrial flutter   History of Present Illness: Darin Garcia is a 58 y.o. male who presents today for electrophysiology evaluation.   He is referred by Dr Anne Fu and Nada Boozer for EP consultation regarding atrial flutter.  He has a h/o recurrent DVT and pulmonary embolism.  He is chronically anticoagulated.  His EF is 45-50% (in the setting of rapidly conducting atrial flutter).  He presented 11/23/2018 with typical appearing atrial flutter.  He reports symptoms of SOB and fatigue.  He also has occasional dizziness.  His xarelto was increased to 20mg  daily.  When in sinus rhythm, he has significant bradycardia.     Today, he denies symptoms of palpitations, chest pain, shortness of breath, orthopnea, PND, lower extremity edema, claudication, dizziness, presyncope, syncope, bleeding, or neurologic sequela. The patient is tolerating medications without difficulties and is otherwise without complaint today.    Past Medical History:  Diagnosis Date  . Atrial flutter (HCC)    a. s/p TEE DCCV 11/2018.  [name] Cardiomyopathy (HCC)   . Dyspnea   . GERD (gastroesophageal reflux disease)   . History of DVT (deep vein thrombosis)   . Pulmonary embolism [facility])    Past Surgical History:  Procedure Laterality Date  . CARDIOVERSION N/A 11/25/2018   Procedure: CARDIOVERSION;  Surgeon: Lewayne Bunting, MD;  Location: [facility] ENDOSCOPY;  Service: Cardiovascular;  Laterality: N/A;  . TEE WITHOUT CARDIOVERSION N/A 11/25/2018   Procedure: TRANSESOPHAGEAL ECHOCARDIOGRAM (TEE);  Surgeon: Lewayne Bunting, MD;  Location: [facility] ENDOSCOPY;  Service: Cardiovascular;  Laterality: N/A;     Current Outpatient Medications  Medication Sig Dispense Refill  . Fluticasone-Salmeterol (ADVAIR) 250-50 MCG/DOSE AEPB  Inhale 1 puff into the lungs 2 (two) times daily as needed (shortness of breath and wheezing).     . metoprolol succinate (TOPROL-XL) 25 MG 24 hr tablet Take 12.5 mg by mouth as needed (you may take half a tablet for break through A flutter).    . pindolol (VISKEN) 5 MG tablet Take 0.5 tablets (2.5 mg total) by mouth 2 (two) times daily. 45 tablet 3  . rivaroxaban (XARELTO) 20 MG TABS tablet Take 1 tablet (20 mg total) by mouth daily. 30 tablet 6   No current facility-administered medications for this visit.     Allergies:   Patient has no known allergies.   Social History:  The patient  reports that he quit smoking about 25 years ago. His smoking use included cigarettes. He has a 4.00 pack-year smoking history. He has never used smokeless tobacco. He reports current alcohol use. He reports that he does not use drugs.   Family History:  The patient's  family history includes CAD in his sister; Pulmonary embolism in his father.    ROS:  Please see the history of present illness.   All other systems are personally reviewed and negative.    PHYSICAL EXAM: VS:  BP 116/72   Pulse (!) 59   Ht 6\' 3"  (1.905 m)   Wt 243 lb 6.4 oz (110.4 kg)   SpO2 99%   BMI 30.42 kg/m  , BMI Body mass index is 30.42 kg/m. GEN: Well nourished, well developed, in no acute distress  HEENT: normal  Neck: no JVD, carotid bruits, or masses Cardiac: RRR; no murmurs, rubs, or  gallops,no edema  Respiratory:  clear to auscultation bilaterally, normal work of breathing GI: soft, nontender, nondistended, + BS MS: no deformity or atrophy  Skin: warm and dry  Neuro:  Strength and sensation are intact Psych: euthymic mood, full affect  EKG:  EKG is ordered today. The ekg ordered today is personally reviewed and shows low atrial rhythm   Recent Labs: 11/23/2018: ALT 14; BUN 11; Creatinine, Ser 0.90; Hemoglobin 15.3; Platelets 275; Potassium 4.0; Sodium 134; TSH 1.657  personally reviewed   Lipid Panel       Component Value Date/Time   CHOL 205 (H) 11/24/2018 0219   TRIG 68 11/24/2018 0219   HDL 56 11/24/2018 0219   CHOLHDL 3.7 11/24/2018 0219   VLDL 14 11/24/2018 0219   LDLCALC 135 (H) 11/24/2018 0219   personally reviewed   Wt Readings from Last 3 Encounters:  01/30/19 243 lb 6.4 oz (110.4 kg)  01/19/19 247 lb 1.9 oz (112.1 kg)  12/20/18 244 lb (110.7 kg)      Other studies personally reviewed: Additional studies/ records that were reviewed today include: prior ekgs, office notes, recent event monitor  Review of the above records today demonstrates: as above   ASSESSMENT AND PLAN:  1.  Typical appearing atrial flutter Several recent hospital visits for typical atrial flutter.  His event monitor shows atrial flutter with RVR also.  Though there is some afib on his monitor, this may represent degeneration from AF.  NSVT is also observed.  At this time, I would advise CTI ablation and then to follow clinically for afib. Therapeutic strategies for atrial flutter including medicine and ablation were discussed in detail with the patient today. Risk, benefits, and alternatives to EP study and radiofrequency ablation were also discussed in detail today. These risks include but are not limited to stroke, bleeding, vascular damage, tamponade, perforation, damage to the heart and other structures, AV block requiring pacemaker, worsening renal function, and death. The patient understands these risk and wishes to proceed.  We will therefore proceed with catheter ablation at the next available time.  Continue anticoagulation chronically given prior hypercoagulable state and L leg DVTs/ PTE.   Current medicines are reviewed at length with the patient today.   The patient does not have concerns regarding his medicines.  The following changes were made today:  None   Signed, Hillis Range, MD  01/30/2019 9:09 AM     [facility] [address] [place]  [address] [phone] (office) [phone] (fax)

## 2019-02-03 ENCOUNTER — Institutional Professional Consult (permissible substitution): Payer: Managed Care, Other (non HMO) | Admitting: Internal Medicine

## 2019-02-07 ENCOUNTER — Telehealth: Payer: Self-pay

## 2019-02-07 NOTE — Telephone Encounter (Signed)
Pt notified of cancellation of aflutter ablation.  Will reschedule at later date.

## 2019-02-14 ENCOUNTER — Ambulatory Visit (HOSPITAL_COMMUNITY): Admit: 2019-02-14 | Payer: Managed Care, Other (non HMO) | Admitting: Internal Medicine

## 2019-02-14 ENCOUNTER — Encounter (HOSPITAL_COMMUNITY): Payer: Self-pay

## 2019-02-14 ENCOUNTER — Telehealth: Payer: Self-pay | Admitting: Internal Medicine

## 2019-02-14 SURGERY — A-FLUTTER ABLATION
Anesthesia: General

## 2019-02-14 MED ORDER — METOPROLOL TARTRATE 25 MG PO TABS: ORAL_TABLET | ORAL | 1 refills | Status: DC

## 2019-02-14 NOTE — Telephone Encounter (Signed)
Pt was wondering if there was a device he could be provided with to monitor his heart rate. He is still experiencing symptoms, and understands  That his ablation had to be postponed. He is still experiencing symptoms of afib/aflutter.  He does not want to go to the ED, and wants to monitor at home as best he can

## 2019-02-14 NOTE — Telephone Encounter (Signed)
Call placed to Pt.  Advised Pt to call afib clinic for further advice on managing breakthrough palpitations while awaiting elective ablation.  Number to afib clinic given.

## 2019-02-14 NOTE — Telephone Encounter (Signed)
Talked with patient - took metoprolol succinate 12.5mg  about 11am HR in the 118-150 range feels ok just concerned the HR hasnt returned to normal yet. Educated pt on what to expect with AF/medications. Discussed with Rudi Coco, NP - will switch PRN metoprolol to tartrate to use. Pt will continue using lopressor as needed every 6 hours. If HR becomes persistent he will let us know. Pt in agreement.

## 2019-02-14 NOTE — Telephone Encounter (Signed)
Returning patient call about experiencing Afib/Aflutter issues. Message was sent to [facility] not Atrial Fib [facility].  Patient has documentation of his heart being out of rhythm, has taken both Pinodolol, and Metoprolol this morning with no relief.  Would like to know what is the next step? Message will be forward to Atrial Fib Clinic.

## 2019-03-15 ENCOUNTER — Ambulatory Visit: Payer: Managed Care, Other (non HMO) | Admitting: Internal Medicine

## 2019-04-03 ENCOUNTER — Other Ambulatory Visit (HOSPITAL_COMMUNITY)
Admission: RE | Admit: 2019-04-03 | Discharge: 2019-04-03 | Disposition: A | Discharge status: Home / Self Care | Payer: Managed Care, Other (non HMO) | Source: Ambulatory Visit | Attending: Internal Medicine | Admitting: Internal Medicine

## 2019-04-03 ENCOUNTER — Other Ambulatory Visit: Payer: Self-pay

## 2019-04-03 DIAGNOSIS — Z1159 Encounter for screening for other viral diseases: Secondary | ICD-10-CM | POA: Insufficient documentation

## 2019-04-03 LAB — SARS CORONAVIRUS 2 BY RT PCR (HOSPITAL ORDER, PERFORMED IN ~~LOC~~ HOSPITAL LAB): SARS Coronavirus 2: NEGATIVE

## 2019-04-03 NOTE — Anesthesia Preprocedure Evaluation (Addendum)
Anesthesia Evaluation  Patient identified by MRN, date of birth, ID band Patient awake    Reviewed: Allergy & Precautions, NPO status , Patient's Chart, lab work & pertinent test results  History of Anesthesia Complications Negative for: history of anesthetic complications  Airway Mallampati: I  TM Distance: >3 FB Neck ROM: Full    Dental  (+) Dental Advisory Given   Pulmonary former smoker (quit 1995), PE   breath sounds clear to auscultation       Cardiovascular hypertension, Pt. on medications (-) angina+ DVT  + dysrhythmias Atrial Fibrillation  Rhythm:Regular Rate:Bradycardia  11/2018 stress: EF 54%, no ischemia, no scar   Neuro/Psych negative neurological ROS     GI/Hepatic negative GI ROS, Neg liver ROS,   Endo/Other  negative endocrine ROS  Renal/GU negative Renal ROS     Musculoskeletal   Abdominal   Peds  Hematology Xarelto   Anesthesia Other Findings   Reproductive/Obstetrics                            Anesthesia Physical Anesthesia Plan  ASA: III  Anesthesia Plan: General   Post-op Pain Management:    Induction: Intravenous  PONV Risk Score and Plan: 2 and Ondansetron and Dexamethasone  Airway Management Planned: Oral ETT  Additional Equipment:   Intra-op Plan:   Post-operative Plan: Extubation in OR  Informed Consent: I have reviewed the patients History and Physical, chart, labs and discussed the procedure including the risks, benefits and alternatives for the proposed anesthesia with the patient or authorized representative who has indicated his/her understanding and acceptance.     Dental advisory given  Plan Discussed with: CRNA and Surgeon  Anesthesia Plan Comments:        Anesthesia Quick Evaluation

## 2019-04-04 ENCOUNTER — Ambulatory Visit (HOSPITAL_COMMUNITY): Payer: Managed Care, Other (non HMO) | Admitting: Anesthesiology

## 2019-04-04 ENCOUNTER — Ambulatory Visit (HOSPITAL_COMMUNITY)
Admission: RE | Admit: 2019-04-04 | Discharge: 2019-04-04 | Disposition: A | Discharge status: Home / Self Care | Payer: Managed Care, Other (non HMO) | Attending: Internal Medicine | Admitting: Internal Medicine

## 2019-04-04 ENCOUNTER — Encounter (HOSPITAL_COMMUNITY)
Admission: RE | Disposition: A | Discharge status: Home / Self Care | Payer: Managed Care, Other (non HMO) | Source: Home / Self Care | Attending: Internal Medicine

## 2019-04-04 ENCOUNTER — Other Ambulatory Visit: Payer: Self-pay

## 2019-04-04 DIAGNOSIS — Z86718 Personal history of other venous thrombosis and embolism: Secondary | ICD-10-CM | POA: Insufficient documentation

## 2019-04-04 DIAGNOSIS — Z86711 Personal history of pulmonary embolism: Secondary | ICD-10-CM | POA: Insufficient documentation

## 2019-04-04 DIAGNOSIS — K219 Gastro-esophageal reflux disease without esophagitis: Secondary | ICD-10-CM | POA: Insufficient documentation

## 2019-04-04 DIAGNOSIS — Z8249 Family history of ischemic heart disease and other diseases of the circulatory system: Secondary | ICD-10-CM | POA: Insufficient documentation

## 2019-04-04 DIAGNOSIS — I483 Typical atrial flutter: Secondary | ICD-10-CM | POA: Diagnosis present

## 2019-04-04 DIAGNOSIS — Z79899 Other long term (current) drug therapy: Secondary | ICD-10-CM | POA: Insufficient documentation

## 2019-04-04 DIAGNOSIS — I4891 Unspecified atrial fibrillation: Secondary | ICD-10-CM | POA: Diagnosis not present

## 2019-04-04 DIAGNOSIS — Z87891 Personal history of nicotine dependence: Secondary | ICD-10-CM | POA: Insufficient documentation

## 2019-04-04 DIAGNOSIS — Z7951 Long term (current) use of inhaled steroids: Secondary | ICD-10-CM | POA: Insufficient documentation

## 2019-04-04 DIAGNOSIS — Z7901 Long term (current) use of anticoagulants: Secondary | ICD-10-CM | POA: Insufficient documentation

## 2019-04-04 DIAGNOSIS — I428 Other cardiomyopathies: Secondary | ICD-10-CM | POA: Insufficient documentation

## 2019-04-04 DIAGNOSIS — Z832 Family history of diseases of the blood and blood-forming organs and certain disorders involving the immune mechanism: Secondary | ICD-10-CM | POA: Insufficient documentation

## 2019-04-04 HISTORY — PX: A-FLUTTER ABLATION: EP1230

## 2019-04-04 LAB — BASIC METABOLIC PANEL
Anion gap: 7 (ref 5–15)
BUN: 11 mg/dL (ref 6–20)
CO2: 23 mmol/L (ref 22–32)
Calcium: 8.9 mg/dL (ref 8.9–10.3)
Chloride: 108 mmol/L (ref 98–111)
Creatinine, Ser: 1.01 mg/dL (ref 0.61–1.24)
GFR calc Af Amer: >60 mL/min (ref >60)
GFR calc non Af Amer: >60 mL/min (ref >60)
Glucose, Bld: 106 mg/dL — ABNORMAL HIGH (ref 70–99)
Potassium: 4 mmol/L (ref 3.5–5.1)
Sodium: 138 mmol/L (ref 135–145)

## 2019-04-04 LAB — CBC
HCT: 43.9 % (ref 39.0–52.0)
Hemoglobin: 15.4 g/dL (ref 13.0–17.0)
MCH: 31 pg (ref 26.0–34.0)
MCHC: 35.1 g/dL (ref 30.0–36.0)
MCV: 88.3 fL (ref 80.0–100.0)
Platelets: 228 10*3/uL (ref 150–400)
RBC: 4.97 MIL/uL (ref 4.22–5.81)
RDW: 11.6 % (ref 11.5–15.5)
WBC: 4.9 10*3/uL (ref 4.0–10.5)
nRBC: 0 % (ref 0.0–0.2)

## 2019-04-04 SURGERY — A-FLUTTER ABLATION
Anesthesia: General

## 2019-04-04 MED ORDER — PROPOFOL 10 MG/ML IV BOLUS
INTRAVENOUS | Status: DC | PRN
  Administered 2019-04-04: 200 mg via INTRAVENOUS

## 2019-04-04 MED ORDER — MIDAZOLAM HCL 5 MG/5ML IJ SOLN
INTRAMUSCULAR | Status: DC | PRN
  Administered 2019-04-04: 2 mg via INTRAVENOUS

## 2019-04-04 MED ORDER — SODIUM CHLORIDE 0.9 % IV SOLN: 250.0000 mL | INTRAVENOUS | Status: DC | PRN

## 2019-04-04 MED ORDER — ACETAMINOPHEN 325 MG PO TABS: 650.0000 mg | ORAL_TABLET | ORAL | Status: DC | PRN

## 2019-04-04 MED ORDER — ROCURONIUM BROMIDE 10 MG/ML (PF) SYRINGE
PREFILLED_SYRINGE | INTRAVENOUS | Status: DC | PRN
  Administered 2019-04-04: 50 mg via INTRAVENOUS

## 2019-04-04 MED ORDER — SUGAMMADEX SODIUM 200 MG/2ML IV SOLN
INTRAVENOUS | Status: DC | PRN
  Administered 2019-04-04: 100 mg via INTRAVENOUS
  Administered 2019-04-04: 200 mg via INTRAVENOUS

## 2019-04-04 MED ORDER — EPHEDRINE SULFATE 50 MG/ML IJ SOLN
INTRAMUSCULAR | Status: DC | PRN
  Administered 2019-04-04: 5 mg via INTRAVENOUS

## 2019-04-04 MED ORDER — ONDANSETRON HCL 4 MG/2ML IJ SOLN: 4.0000 mg | Freq: Four times a day (QID) | INTRAMUSCULAR | Status: DC | PRN

## 2019-04-04 MED ORDER — MEPERIDINE HCL 25 MG/ML IJ SOLN: 6.2500 mg | INTRAMUSCULAR | Status: DC | PRN

## 2019-04-04 MED ORDER — SODIUM CHLORIDE 0.9% FLUSH: 3.0000 mL | INTRAVENOUS | Status: DC | PRN

## 2019-04-04 MED ORDER — HYDROCODONE-ACETAMINOPHEN 5-325 MG PO TABS: 1.0000 | ORAL_TABLET | ORAL | Status: DC | PRN

## 2019-04-04 MED ORDER — SODIUM CHLORIDE 0.9 % IV SOLN
INTRAVENOUS | Status: DC
  Administered 2019-04-04 (×2): via INTRAVENOUS

## 2019-04-04 MED ORDER — BUPIVACAINE HCL (PF) 0.25 % IJ SOLN
INTRAMUSCULAR | Status: DC | PRN
  Administered 2019-04-04: 20 mL

## 2019-04-04 MED ORDER — FENTANYL CITRATE (PF) 250 MCG/5ML IJ SOLN
INTRAMUSCULAR | Status: DC | PRN
  Administered 2019-04-04 (×4): 50 ug via INTRAVENOUS

## 2019-04-04 MED ORDER — BUPIVACAINE HCL (PF) 0.25 % IJ SOLN
INTRAMUSCULAR | Status: AC
  Filled 2019-04-04: qty 30

## 2019-04-04 MED ORDER — SODIUM CHLORIDE 0.9 % IV SOLN: INTRAVENOUS | Status: DC | PRN

## 2019-04-04 MED ORDER — GLYCOPYRROLATE 0.2 MG/ML IJ SOLN
INTRAMUSCULAR | Status: DC | PRN
  Administered 2019-04-04: 0.2 mg via INTRAVENOUS

## 2019-04-04 MED ORDER — PROMETHAZINE HCL 25 MG/ML IJ SOLN: 6.2500 mg | INTRAMUSCULAR | Status: DC | PRN

## 2019-04-04 MED ORDER — DEXAMETHASONE SODIUM PHOSPHATE 10 MG/ML IJ SOLN
INTRAMUSCULAR | Status: DC | PRN
  Administered 2019-04-04: 10 mg via INTRAVENOUS

## 2019-04-04 MED ORDER — PHENYLEPHRINE 40 MCG/ML (10ML) SYRINGE FOR IV PUSH (FOR BLOOD PRESSURE SUPPORT)
PREFILLED_SYRINGE | INTRAVENOUS | Status: DC | PRN
  Administered 2019-04-04: 40 ug via INTRAVENOUS

## 2019-04-04 MED ORDER — FENTANYL CITRATE (PF) 100 MCG/2ML IJ SOLN: 25.0000 ug | INTRAMUSCULAR | Status: DC | PRN

## 2019-04-04 MED ORDER — SODIUM CHLORIDE 0.9% FLUSH: 3.0000 mL | Freq: Two times a day (BID) | INTRAVENOUS | Status: DC

## 2019-04-04 MED ORDER — MIDAZOLAM HCL 2 MG/2ML IJ SOLN: 0.5000 mg | Freq: Once | INTRAMUSCULAR | Status: DC | PRN

## 2019-04-04 MED ORDER — ONDANSETRON HCL 4 MG/2ML IJ SOLN
INTRAMUSCULAR | Status: DC | PRN
  Administered 2019-04-04: 4 mg via INTRAVENOUS

## 2019-04-04 SURGICAL SUPPLY — 8 items
CATH EZ STEER NAV 8MM F-J CUR (ABLATOR) ×3 IMPLANT
CATH WEBSTER BI DIR CS D-F CRV (CATHETERS) ×3 IMPLANT
PACK EP LATEX FREE (CUSTOM PROCEDURE TRAY) ×2
PACK EP LF (CUSTOM PROCEDURE TRAY) ×1 IMPLANT
PAD PRO RADIOLUCENT 2001M-C (PAD) ×3 IMPLANT
PATCH CARTO3 (PAD) ×3 IMPLANT
SHEATH PINNACLE 7F 10CM (SHEATH) ×3 IMPLANT
SHEATH PINNACLE 8F 10CM (SHEATH) ×3 IMPLANT

## 2019-04-04 NOTE — Anesthesia Procedure Notes (Signed)
Procedure Name: Intubation Date/Time: 04/04/2019 7:47 AM Performed by: Marena Chancy, CRNA Pre-anesthesia Checklist: Patient identified, Emergency Drugs available, Suction available and Patient being monitored Patient Re-evaluated:Patient Re-evaluated prior to induction Oxygen Delivery Method: Circle System Utilized Preoxygenation: Pre-oxygenation with 100% oxygen Induction Type: IV induction Ventilation: Mask ventilation without difficulty Laryngoscope Size: Miller and 3 Grade View: Grade I Tube type: Oral Tube size: 8.0 mm Number of attempts: 1 Airway Equipment and Method: Stylet and Oral airway Placement Confirmation: ETT inserted through vocal cords under direct vision,  positive ETCO2 and breath sounds checked- equal and bilateral Tube secured with: Tape Dental Injury: Teeth and Oropharynx as per pre-operative assessment

## 2019-04-04 NOTE — Progress Notes (Addendum)
Pt wife returned call discharge instructions reviewed with her voices understanding. Mr Thomaston ambulated tol well

## 2019-04-04 NOTE — Progress Notes (Signed)
Site area: Right groin a 7 and 8 french venous sheath was removed  Site Prior to Removal:  Level 0  Pressure Applied For 20 MINUTES    Bedrest Beginning at 0945am  Manual:   Yes.    Patient Status During Pull:  stable  Post Pull Groin Site:  Level 0  Post Pull Instructions Given:  Yes.    Post Pull Pulses Present:  Yes.    Dressing Applied:  Yes.    Comments:  VS remain stable

## 2019-04-04 NOTE — Anesthesia Postprocedure Evaluation (Signed)
Anesthesia Post Note  Patient: Darin Garcia  Procedure(s) Performed: A-FLUTTER ABLATION (N/A )     Patient location during evaluation: Endoscopy Anesthesia Type: General Level of consciousness: awake and alert, patient cooperative and oriented Pain management: pain level controlled Vital Signs Assessment: post-procedure vital signs reviewed and stable Respiratory status: nonlabored ventilation, spontaneous breathing and respiratory function stable Cardiovascular status: blood pressure returned to baseline and stable Postop Assessment: no apparent nausea or vomiting Anesthetic complications: no    Last Vitals:  Vitals:   04/04/19 1145 04/04/19 1200  BP:  (!) 147/79  Pulse: (!) 51 73  Resp: 15 17  Temp:    SpO2: 99% 100%    Last Pain:  Vitals:   04/04/19 1017  TempSrc: Oral  PainSc: 0-No pain                 Nishita Isaacks,E. Matan Steen

## 2019-04-04 NOTE — H&P (Signed)
CC: atrial flutter   History of Present Illness: Darin Garcia is a 58 y.o. male who presents today for electrophysiology study and ablation of atrial flutter. He has a h/o recurrent DVT and pulmonary embolism.  He is chronically anticoagulated.  His EF is 45-50% (in the setting of rapidly conducting atrial flutter).  He presented 11/23/2018 with typical appearing atrial flutter.  He reports symptoms of SOB and fatigue.  He also has occasional dizziness.  His xarelto was increased to 20mg  daily.  When in sinus rhythm, he has significant bradycardia.     Today, he denies symptoms of palpitations, chest pain, shortness of breath, orthopnea, PND, lower extremity edema, claudication, dizziness, presyncope, syncope, bleeding, or neurologic sequela. The patient is tolerating medications without difficulties and is otherwise without complaint today.        Past Medical History:  Diagnosis Date  . Atrial flutter (HCC)    a. s/p TEE DCCV 11/2018.  [name] Cardiomyopathy (HCC)   . Dyspnea   . GERD (gastroesophageal reflux disease)   . History of DVT (deep vein thrombosis)   . Pulmonary embolism [facility])         Past Surgical History:  Procedure Laterality Date  . CARDIOVERSION N/A 11/25/2018   Procedure: CARDIOVERSION;  Surgeon: Lewayne Bunting, MD;  Location: [facility] ENDOSCOPY;  Service: Cardiovascular;  Laterality: N/A;  . TEE WITHOUT CARDIOVERSION N/A 11/25/2018   Procedure: TRANSESOPHAGEAL ECHOCARDIOGRAM (TEE);  Surgeon: Lewayne Bunting, MD;  Location: [facility] ENDOSCOPY;  Service: Cardiovascular;  Laterality: N/A;           Current Outpatient Medications  Medication Sig Dispense Refill  . Fluticasone-Salmeterol (ADVAIR) 250-50 MCG/DOSE AEPB Inhale 1 puff into the lungs 2 (two) times daily as needed (shortness of breath and wheezing).     . metoprolol succinate (TOPROL-XL) 25 MG 24 hr tablet Take 12.5 mg by mouth as needed (you may take half a tablet for break through A flutter).    .  pindolol (VISKEN) 5 MG tablet Take 0.5 tablets (2.5 mg total) by mouth 2 (two) times daily. 45 tablet 3  . rivaroxaban (XARELTO) 20 MG TABS tablet Take 1 tablet (20 mg total) by mouth daily. 30 tablet 6   No current facility-administered medications for this visit.     Allergies:   Patient has no known allergies.   Social History:  The patient  reports that he quit smoking about 25 years ago. His smoking use included cigarettes. He has a 4.00 pack-year smoking history. He has never used smokeless tobacco. He reports current alcohol use. He reports that he does not use drugs.   Family History:  The patient's  family history includes CAD in his sister; Pulmonary embolism in his father.    ROS:  Please see the history of present illness.   All other systems are personally reviewed and negative.    PHYSICAL EXAM: Vitals:   04/04/19 0535  BP: 133/83  Pulse: (!) 55  Temp: (!) 97.3 F (36.3 C)  SpO2: 100%    GEN: Well nourished, well developed, in no acute distress  HEENT: normal  Neck: no JVD, carotid bruits, or masses Cardiac: RRR; no murmurs, rubs, or gallops,no edema  Respiratory:  clear to auscultation bilaterally, normal work of breathing GI: soft, nontender, nondistended, + BS MS: no deformity or atrophy  Skin: warm and dry  Neuro:  Strength and sensation are intact Psych: euthymic mood, full affect  EKG:  EKG is ordered today. The ekg ordered today is personally  reviewed and shows low atrial rhythm   Recent Labs: 11/23/2018: ALT 14; BUN 11; Creatinine, Ser 0.90; Hemoglobin 15.3; Platelets 275; Potassium 4.0; Sodium 134; TSH 1.657  personally reviewed   Lipid Panel  Labs(Brief)          Component Value Date/Time   CHOL 205 (H) 11/24/2018 0219   TRIG 68 11/24/2018 0219   HDL 56 11/24/2018 0219   CHOLHDL 3.7 11/24/2018 0219   VLDL 14 11/24/2018 0219   LDLCALC 135 (H) 11/24/2018 0219     personally reviewed      Wt Readings from Last 3  Encounters:  01/30/19 243 lb 6.4 oz (110.4 kg)  01/19/19 247 lb 1.9 oz (112.1 kg)  12/20/18 244 lb (110.7 kg)      Other studies personally reviewed: Additional studies/ records that were reviewed today include: prior ekgs, office notes, recent event monitor  Review of the above records today demonstrates: as above   ASSESSMENT AND PLAN:  1.  Typical appearing atrial flutter Several recent hospital visits for typical atrial flutter.  His event monitor shows atrial flutter with RVR also.  Though there is some afib on his monitor, this may represent degeneration from AF.  NSVT is also observed.  At this time, I would advise CTI ablation and then to follow clinically for afib. Therapeutic strategies for atrial flutter including medicine and ablation were discussed in detail with the patient today. Risk, benefits, and alternatives to EP study and radiofrequency ablation were also discussed in detail today. These risks include but are not limited to stroke, bleeding, vascular damage, tamponade, perforation, damage to the heart and other structures, AV block requiring pacemaker, worsening renal function, and death. The patient understands these risk and wishes to proceed.  We will therefore proceed with catheter ablation at the next available time.  Continue anticoagulation chronically given prior hypercoagulable state and L leg DVTs/ PTE  Hillis Range MD, [facility] 04/04/2019 7:13 AM

## 2019-04-04 NOTE — Progress Notes (Signed)
Dr Allred in to see pt  

## 2019-04-04 NOTE — Progress Notes (Signed)
Attempted to call pt wife Elon Jester. Message left on 2 telephone numbers . Discharge instructions reviewed with pt voices understanding.

## 2019-04-04 NOTE — Discharge Instructions (Signed)
Post procedure care instructions No driving for 4 days. No lifting over 5 lbs for 1 week. No sexual activity for 1 week. You may return to work on 04/11/2019 . Keep procedure site clean & dry. If you notice increased pain, swelling, bleeding or pus, call/return!  You may shower, but no soaking baths/hot tubs/pools for 1 week.    PLEASE SIGN UP TO YOUR MY CHART ACCOUNT WHEN YOU GET HOME (if you aren't already).  IN THE CURRENT ENVIRONMENT WITH COVID-19, IN EFFORT TO REDUCE YOUR EXPOSURE WE WILL BE CONDUCTING MANY PATIENT VISITS BY EITHER VIRTUAL/VIDEO VISITS or TELEPHONE VISITS.  BEING SIGNED UP IN YOUR MY CHART ACCOUNT  WILL HELP FACILITATE THESE VISITS AND OUR COMMUNICATION WITH YOU  YOUR CARDIOLOGY TEAM HAS ARRANGED FOR AN E-VISIT FOR YOUR APPOINTMENT - PLEASE REVIEW IMPORTANT INFORMATION BELOW SEVERAL DAYS PRIOR TO YOUR APPOINTMENT  Due to the recent COVID-19 pandemic, we are transitioning in-person office visits to tele-medicine visits in an effort to decrease unnecessary exposure to our patients, their families, and staff. These visits are billed to your insurance just like a normal visit is. We also encourage you to sign up for MyChart if you have not already done so. You will need a smartphone if possible. For patients that do not have this, we can still complete the visit using a regular telephone but do prefer a smartphone to enable video when possible. You may have a family member that lives with you that can help. If possible, we also ask that you have a blood pressure cuff and scale at home to measure your blood pressure, heart rate and weight prior to your scheduled appointment. Patients with clinical needs that need an in-person evaluation and testing will still be able to come to the office if absolutely necessary. If you have any questions, feel free to call our office.     YOUR PROVIDER WILL BE USING THE FOLLOWING PLATFORM TO COMPLETE YOUR VISIT: MYCHART   IF USING MYCHART - How to  Download the MyChart App to Your SmartPhone   - If Apple, go to [employer] and type in MyChart in the search bar and download the app. If Android, ask patient to go to [employer] and type in [place] in the search bar and download the app. The app is free but as with any other app downloads, your phone may require you to verify saved payment information or Apple/Android password.  - You will need to then log into the app with your MyChart username and password, and select [facility] as your healthcare provider to link the account.  - When it is time for your visit, go to the MyChart app, find appointments, and click Begin Video Visit. Be sure to Select Allow for your device to access the Microphone and Camera for your visit. You will then be connected, and your provider will be with you shortly.  **If you have any issues connecting or need assistance, please contact MyChart service desk [phone])**  **If using a computer, in order to ensure the best quality for your visit, you will need to use either of the following Internet Browsers: Agricultural consultant or Microsoft Edge**   IF USING DOXIMITY or [email or web address].ME - The staff will give you instructions on receiving your link to join the meeting the day of your visit.      2-3 DAYS BEFORE YOUR APPOINTMENT  You will receive a telephone call from one of our [facility] team members - your  caller ID may say "Unknown caller." If this is a video visit, we will walk you through how to get the video launched on your phone. We will remind you check your blood pressure, heart rate and weight prior to your scheduled appointment. If you have an Apple Watch or Kardia, please upload any pertinent ECG strips the day before or morning of your appointment to MyChart. Our staff will also make sure you have reviewed the consent and agree to move forward with your scheduled tele-health visit.     THE DAY OF YOUR APPOINTMENT  Approximately 15 minutes prior  to your scheduled appointment, you will receive a telephone call from one of [facility] team - your caller ID may say "Unknown caller."  Our staff will confirm medications, vital signs for the day and any symptoms you may be experiencing. Please have this information available prior to the time of visit start. It may also be helpful for you to have a pad of paper and pen handy for any instructions given during your visit. They will also walk you through joining the smartphone meeting if this is a video visit.    CONSENT FOR TELE-HEALTH VISIT - PLEASE REVIEW  I hereby voluntarily request, consent and authorize [facility] and its employed or contracted physicians, physician assistants, nurse practitioners or other licensed health care professionals (the Practitioner), to provide me with telemedicine health care services (the Services") as deemed necessary by the treating Practitioner. I acknowledge and consent to receive the Services by the Practitioner via telemedicine. I understand that the telemedicine visit will involve communicating with the Practitioner through live audiovisual communication technology and the disclosure of certain medical information by electronic transmission. I acknowledge that I have been given the opportunity to request an in-person assessment or other available alternative prior to the telemedicine visit and am voluntarily participating in the telemedicine visit.  I understand that I have the right to withhold or withdraw my consent to the use of telemedicine in the course of my care at any time, without affecting my right to future care or treatment, and that the Practitioner or I may terminate the telemedicine visit at any time. I understand that I have the right to inspect all information obtained and/or recorded in the course of the telemedicine visit and may receive copies of available information for a reasonable fee.  I understand that some of the potential risks of  receiving the Services via telemedicine include:   Delay or interruption in medical evaluation due to technological equipment failure or disruption;  Information transmitted may not be sufficient (e.g. poor resolution of images) to allow for appropriate medical decision making by the Practitioner; and/or   In rare instances, security protocols could fail, causing a breach of personal health information.  Furthermore, I acknowledge that it is my responsibility to provide information about my medical history, conditions and care that is complete and accurate to the best of my ability. I acknowledge that Practitioner's advice, recommendations, and/or decision may be based on factors not within their control, such as incomplete or inaccurate data provided by me or distortions of diagnostic images or specimens that may result from electronic transmissions. I understand that the practice of medicine is not an exact science and that Practitioner makes no warranties or guarantees regarding treatment outcomes. I acknowledge that I will receive a copy of this consent concurrently upon execution via email to the email address I last provided but may also request a printed copy by calling the  office of [facility].    I understand that my insurance will be billed for this visit.   I have read or had this consent read to me.  I understand the contents of this consent, which adequately explains the benefits and risks of the Services being provided via telemedicine.   I have been provided ample opportunity to ask questions regarding this consent and the Services and have had my questions answered to my satisfaction.  I give my informed consent for the services to be provided through the use of telemedicine in my medical care  By participating in this telemedicine visit I agree to the above.    Femoral Site Care This sheet gives you information about how to care for yourself after your procedure. Your  health care provider may also give you more specific instructions. If you have problems or questions, contact your health care provider. What can I expect after the procedure? After the procedure, it is common to have:  Bruising that usually fades within 1-2 weeks.  Tenderness at the site. Follow these instructions at home: Wound care  Follow instructions from your health care provider about how to take care of your insertion site. Make sure you: ? Wash your hands with soap and water before you change your bandage (dressing). If soap and water are not available, use hand sanitizer. ? Change your dressing as told by your health care provider. ? Leave stitches (sutures), skin glue, or adhesive strips in place. These skin closures may need to stay in place for 2 weeks or longer. If adhesive strip edges start to loosen and curl up, you may trim the loose edges. Do not remove adhesive strips completely unless your health care provider tells you to do that.  Do not take baths, swim, or use a hot tub until your health care provider approves.  You may shower 24-48 hours after the procedure or as told by your health care provider. ? Gently wash the site with plain soap and water. ? Pat the area dry with a clean towel. ? Do not rub the site. This may cause bleeding.  Do not apply powder or lotion to the site. Keep the site clean and dry.  Check your femoral site every day for signs of infection. Check for: ? Redness, swelling, or pain. ? Fluid or blood. ? Warmth. ? Pus or a bad smell. Activity  For the first 2-3 days after your procedure, or as long as directed: ? Avoid climbing stairs as much as possible. ? Do not squat.  Do not lift anything that is heavier than 10 lb (4.5 kg), or the limit that you are told, until your health care provider says that it is safe.  Rest as directed. ? Avoid sitting for a long time without moving. Get up to take short walks every 1-2 hours.  Do not drive  for 24 hours if you were given a medicine to help you relax (sedative). General instructions  Take over-the-counter and prescription medicines only as told by your health care provider.  Keep all follow-up visits as told by your health care provider. This is important. Contact a health care provider if you have:  A fever or chills.  You have redness, swelling, or pain around your insertion site. Get help right away if:  The catheter insertion area swells very fast.  You pass out.  You suddenly start to sweat or your skin gets clammy.  The catheter insertion area is bleeding, and the  bleeding does not stop when you hold steady pressure on the area.  The area near or just beyond the catheter insertion site becomes pale, cool, tingly, or numb. These symptoms may represent a serious problem that is an emergency. Do not wait to see if the symptoms will go away. Get medical help right away. Call your local emergency services (911 in the [place] Do not drive yourself to the hospital. Summary  After the procedure, it is common to have bruising that usually fades within 1-2 weeks.  Check your femoral site every day for signs of infection.  Do not lift anything that is heavier than 10 lb (4.5 kg), or the limit that you are told, until your health care provider says that it is safe. This information is not intended to replace advice given to you by your health care provider. Make sure you discuss any questions you have with your health care provider. Document Released: 07/13/2014 Document Revised: 11/22/2017 Document Reviewed: 11/22/2017 Elsevier Interactive Patient Education  2019 [employer].

## 2019-04-04 NOTE — Progress Notes (Signed)
Dressing to right groin changed due to old drainage noted

## 2019-04-04 NOTE — Transfer of Care (Signed)
Immediate Anesthesia Transfer of Care Note  Patient: Darin [facility]  Procedure(s) Performed: A-FLUTTER ABLATION (N/A )  Patient Location: Cath Lab  Anesthesia Type:General  Level of Consciousness: awake, alert  and oriented  Airway & Oxygen Therapy: Patient Spontanous Breathing  Post-op Assessment: Report given to RN, Post -op Vital signs reviewed and stable and Patient moving all extremities X 4  Post vital signs: Reviewed and stable  Last Vitals:  Vitals Value Taken Time  BP 141/80 04/04/2019  9:17 AM  Temp    Pulse 66 04/04/2019  9:19 AM  Resp 15 04/04/2019  9:19 AM  SpO2 98 % 04/04/2019  9:19 AM  Vitals shown include unvalidated device data.  Last Pain:  Vitals:   04/04/19 0919  TempSrc:   PainSc: 0-No pain         Complications: No apparent anesthesia complications

## 2019-04-05 ENCOUNTER — Encounter (HOSPITAL_COMMUNITY): Payer: Self-pay | Admitting: Internal Medicine

## 2019-04-06 ENCOUNTER — Ambulatory Visit: Payer: Managed Care, Other (non HMO) | Admitting: Internal Medicine

## 2019-05-03 ENCOUNTER — Telehealth: Payer: Self-pay

## 2019-05-05 ENCOUNTER — Telehealth (HOSPITAL_COMMUNITY): Payer: Managed Care, Other (non HMO) | Admitting: Physician Assistant

## 2019-05-05 NOTE — Telephone Encounter (Signed)
Spoke with Darin Garcia regarding appt on 05/08/19. Darin Garcia stated he will check vitals prior to appt and did not have any questions at this moment.  

## 2019-05-08 ENCOUNTER — Encounter: Payer: Self-pay | Admitting: Internal Medicine

## 2019-05-08 ENCOUNTER — Telehealth (INDEPENDENT_AMBULATORY_CARE_PROVIDER_SITE_OTHER): Payer: Managed Care, Other (non HMO) | Admitting: Internal Medicine

## 2019-05-08 VITALS — HR 60 | Ht 75.0 in | Wt 240.0 lb

## 2019-05-08 DIAGNOSIS — I4892 Unspecified atrial flutter: Secondary | ICD-10-CM

## 2019-05-08 DIAGNOSIS — Z86711 Personal history of pulmonary embolism: Secondary | ICD-10-CM

## 2019-05-08 MED ORDER — RIVAROXABAN 10 MG PO TABS: 10.0000 mg | ORAL_TABLET | Freq: Every day | ORAL | 4 refills | Status: DC

## 2019-05-08 NOTE — Progress Notes (Signed)
Electrophysiology TeleHealth Note   Due to national recommendations of social distancing due to COVID 19, an audio/video telehealth visit is felt to be most appropriate for this patient at this time.  See MyChart message from today for the patient's consent to telehealth for Darin Garcia.   Date:  05/08/2019   ID:  Renato Gails, DOB 03/07/1961, MRN [id number]  Location: patient's home  Provider location:  [place]  Evaluation Performed: Follow-up visit  PCP:  Loraine Leriche., MD   Electrophysiologist:  Dr Rayann Heman  Chief Complaint:  Atrial flutter  History of Present Illness:    Darin Garcia is a 58 y.o. male who presents via telehealth conferencing today.  Since his atrial flutter ablation, the patient reports doing very well. denies procedure related complications and is pleased with current state.  Feels "much better".  Today, he denies symptoms of palpitations, chest pain, shortness of breath,  lower extremity edema, dizziness, presyncope, or syncope.  The patient is otherwise without complaint today.  The patient denies symptoms of fevers, chills, cough, or new SOB worrisome for COVID 19.  Past Medical History:  Diagnosis Date  . Atrial flutter ([place])    a. s/p TEE DCCV 11/2018.  [name] Cardiomyopathy ([place])   . Dyspnea   . GERD (gastroesophageal reflux disease)   . History of DVT (deep vein thrombosis)   . Pulmonary embolism [facility])     Past Surgical History:  Procedure Laterality Date  . A-FLUTTER ABLATION N/A 04/04/2019   Procedure: A-FLUTTER ABLATION;  Surgeon: Thompson Grayer, MD;  Location: [facility];  Service: Cardiovascular;  Laterality: N/A;  . CARDIOVERSION N/A 11/25/2018   Procedure: CARDIOVERSION;  Surgeon: Lelon Perla, MD;  Location: [facility] ENDOSCOPY;  Service: Cardiovascular;  Laterality: N/A;  . TEE WITHOUT CARDIOVERSION N/A 11/25/2018   Procedure: TRANSESOPHAGEAL ECHOCARDIOGRAM (TEE);  Surgeon: Lelon Perla, MD;  Location: [facility] ENDOSCOPY;   Service: Cardiovascular;  Laterality: N/A;    Current Outpatient Medications  Medication Sig Dispense Refill  . Fluticasone-Salmeterol (ADVAIR) 250-50 MCG/DOSE AEPB Inhale 1 puff into the lungs daily.     . metoprolol tartrate (LOPRESSOR) 25 MG tablet Take 1 tablet by mouth as needed every 6 hours for AFIB HR >100 45 tablet 1  . rivaroxaban (XARELTO) 20 MG TABS tablet Take 1 tablet (20 mg total) by mouth daily. 30 tablet 6   No current facility-administered medications for this visit.     Allergies:   Patient has no known allergies.   Social History:  The patient  reports that he quit smoking about 25 years ago. His smoking use included cigarettes. He has a 4.00 pack-year smoking history. He has never used smokeless tobacco. He reports current alcohol use. He reports that he does not use drugs.   Family History:  The patient's  family history includes CAD in his sister; Pulmonary embolism in his father.   ROS:  Please see the history of present illness.   All other systems are personally reviewed and negative.    Exam:    Vital Signs:  Pulse 60   Ht 6\' 3"  (1.905 m)   Wt 240 lb (108.9 kg)   BMI 30.00 kg/m   Well appearing, NAD, normal WOB   Labs/Other Tests and Data Reviewed:    Recent Labs: 11/23/2018: ALT 14; TSH 1.657 04/04/2019: BUN 11; Creatinine, Ser 1.01; Hemoglobin 15.4; Platelets 228; Potassium 4.0; Sodium 138   Wt Readings from Last 3 Encounters:  05/08/19 240 lb (108.9 kg)  04/04/19 240 lb (108.9 kg)  01/30/19 243 lb 6.4 oz (110.4 kg)       ASSESSMENT & PLAN:    1.  Atrial flutter Resolved post ablation  2. Prior h/o DVT/PTE Reduce xarelto to 10mg  daily for long term prophylaxis   Follow-up:  Return as needed   Patient Risk:  after full review of this patients clinical status, I feel that they are at moderate risk at this time.  Today, I have spent 15 minutes with the patient with telehealth technology discussing arrhythmia management .    Randolm Idol, MD  05/08/2019 11:01 AM     [facility] [address] [place] [address] [phone] (office) [phone] (fax)

## 2019-06-12 ENCOUNTER — Other Ambulatory Visit: Payer: Self-pay

## 2019-06-12 DIAGNOSIS — Z20828 Contact with and (suspected) exposure to other viral communicable diseases: Secondary | ICD-10-CM

## 2019-06-12 DIAGNOSIS — Z20822 Contact with and (suspected) exposure to covid-19: Secondary | ICD-10-CM

## 2019-06-14 LAB — NOVEL CORONAVIRUS, NAA: SARS-CoV-2, NAA: NOT DETECTED

## 2019-07-05 ENCOUNTER — Ambulatory Visit: Payer: Managed Care, Other (non HMO) | Admitting: Internal Medicine

## 2019-08-07 ENCOUNTER — Telehealth: Payer: Self-pay | Admitting: Internal Medicine

## 2019-08-07 NOTE — Telephone Encounter (Signed)
Appt made with Ricky with afib clinic tomorrow 08/08/2019 at 3:30 pm.   Pt notified.  Will send Pt directions via MyChart per Pt request.

## 2019-08-07 NOTE — Telephone Encounter (Signed)
Very difficult to interpret tracings sent to me from [place] due to artifact. If he is having difficulty with sustained tachycardia, he should get in to see EP APP or AF clinic APP  Thompson Grayer MD, [place] 08/07/2019 3:18 PM

## 2019-08-07 NOTE — Telephone Encounter (Signed)
.  STAT if HR is under 50 or over 120 (normal HR is 60-100 beats per minute)  1) What is your heart rate? *132 now, been between 122 and 132 since yesterday  2) Do you have a log of your heart rate readings (document readings)?  yes  3) Do you have any other symptoms?  Shortness of breath and dizziness

## 2019-08-07 NOTE — Telephone Encounter (Signed)
Pt had Aflutter ablation in May and has had no issues until yesterday.  Began feeling dizzy and would get SOB when exerting.  Feels ok when sitting down.  Does feel fluttering in chest off and on.  Denies pain. HR staying between 122-132. Took 2 Metoprolol yesterday and one today.  HR drops into the 90s for about an hour and then goes right back up.  Has not missed any doses of Xarelto.  Pt has been doing EKGs through an APP and sent them over through [place].  Advised I will send to Dr. Rayann Heman and his nurse to review.

## 2019-08-08 ENCOUNTER — Other Ambulatory Visit: Payer: Self-pay

## 2019-08-08 ENCOUNTER — Encounter (HOSPITAL_COMMUNITY): Payer: Self-pay | Admitting: Physician Assistant

## 2019-08-08 ENCOUNTER — Ambulatory Visit (HOSPITAL_COMMUNITY)
Admission: RE | Admit: 2019-08-08 | Discharge: 2019-08-08 | Disposition: A | Discharge status: Home / Self Care | Payer: Managed Care, Other (non HMO) | Source: Ambulatory Visit | Attending: Physician Assistant | Admitting: Physician Assistant

## 2019-08-08 VITALS — BP 120/88 | HR 125 | Ht 75.0 in | Wt 254.8 lb

## 2019-08-08 DIAGNOSIS — Z86718 Personal history of other venous thrombosis and embolism: Secondary | ICD-10-CM | POA: Insufficient documentation

## 2019-08-08 DIAGNOSIS — Z87891 Personal history of nicotine dependence: Secondary | ICD-10-CM | POA: Diagnosis not present

## 2019-08-08 DIAGNOSIS — Z86711 Personal history of pulmonary embolism: Secondary | ICD-10-CM | POA: Diagnosis not present

## 2019-08-08 DIAGNOSIS — I4892 Unspecified atrial flutter: Secondary | ICD-10-CM | POA: Insufficient documentation

## 2019-08-08 DIAGNOSIS — I4891 Unspecified atrial fibrillation: Secondary | ICD-10-CM | POA: Insufficient documentation

## 2019-08-08 DIAGNOSIS — E669 Obesity, unspecified: Secondary | ICD-10-CM | POA: Insufficient documentation

## 2019-08-08 DIAGNOSIS — Z79899 Other long term (current) drug therapy: Secondary | ICD-10-CM | POA: Insufficient documentation

## 2019-08-08 DIAGNOSIS — Z6831 Body mass index (BMI) 31.0-31.9, adult: Secondary | ICD-10-CM | POA: Insufficient documentation

## 2019-08-08 DIAGNOSIS — Z7901 Long term (current) use of anticoagulants: Secondary | ICD-10-CM | POA: Insufficient documentation

## 2019-08-08 DIAGNOSIS — I429 Cardiomyopathy, unspecified: Secondary | ICD-10-CM | POA: Insufficient documentation

## 2019-08-08 MED ORDER — RIVAROXABAN 20 MG PO TABS: 20.0000 mg | ORAL_TABLET | Freq: Every day | ORAL | 3 refills | Status: DC

## 2019-08-08 MED ORDER — METOPROLOL TARTRATE 25 MG PO TABS: 50.0000 mg | ORAL_TABLET | Freq: Two times a day (BID) | ORAL | 1 refills | Status: DC

## 2019-08-08 NOTE — Progress Notes (Signed)
Primary Care Physician: Cheron SchaumannVelazquez, Gretchen Y., MD Primary Cardiologist: Dr Anne FuSkains Primary Electrophysiologist: Dr Johney FrameAllred Referring Physician: Dr Margaretha SeedsAllred   Darin Garcia is a 58 y.o. male with a history of typical atrial flutter s/p ablation, recurrent DVT and pulmonary embolism, cardiomyopathy (EF is 45-50% in the setting of rapidly conducting atrial flutter) who presents for [facility]. Patient had done very well post ablation until 08/06/19 when he noted dizziness and SOB with exertion as well as elevated heart rates. Kardia strips reviewed by Dr Johney FrameAllred and could not be interpreted 2/2 artifact. Patient appears to be back in typical atrial flutter today with rapid rates. He denies any specific triggers. He denies any significant alcohol use. He had a sleep study 3 years ago which was negative.   Today, he denies symptoms of chest pain, orthopnea, PND, lower extremity edema, presyncope, syncope, daytime somnolence, bleeding, or neurologic sequela. The patient is tolerating medications without difficulties and is otherwise without complaint today.    Atrial Fibrillation Risk Factors:  he does not have symptoms or diagnosis of sleep apnea. Negative sleep study. he does not have a history of rheumatic fever. he does not have a history of alcohol use.   he has a BMI of Body mass index is 31.85 kg/m.[name]. Filed Weights   08/08/19 1539  Weight: 115.6 kg    Family History  Problem Relation Age of Onset  . Pulmonary embolism Father   . CAD Sister      Atrial Fibrillation Management history:  Previous antiarrhythmic drugs: none Previous cardioversions: 11/25/18 Previous ablations: 04/04/19 flutter CHADS2VASC score: 1 Anticoagulation history: Xarelto (has been on 10 mg for h/o DVT and PE)   Past Medical History:  Diagnosis Date  . Atrial flutter (HCC)    a. s/p TEE DCCV 11/2018.  [name]. Cardiomyopathy (HCC)   . Dyspnea   . GERD  (gastroesophageal reflux disease)   . History of DVT (deep vein thrombosis)   . Pulmonary embolism [facility](HCC)    Past Surgical History:  Procedure Laterality Date  . A-FLUTTER ABLATION N/A 04/04/2019   Procedure: A-FLUTTER ABLATION;  Surgeon: Hillis RangeAllred, James, MD;  [facility];  Service: Cardiovascular;  Laterality: N/A;  . CARDIOVERSION N/A 11/25/2018   Procedure: CARDIOVERSION;  Surgeon: Lewayne Buntingrenshaw, Brian S, MD;  [facility];  Service: Cardiovascular;  Laterality: N/A;  . TEE WITHOUT CARDIOVERSION N/A 11/25/2018   Procedure: TRANSESOPHAGEAL ECHOCARDIOGRAM (TEE);  Surgeon: Lewayne Buntingrenshaw, Brian S, MD;  [facility];  Service: Cardiovascular;  Laterality: N/A;    Current Outpatient Medications  Medication Sig Dispense Refill  . metoprolol tartrate (LOPRESSOR) 25 MG tablet Take 2 tablets (50 mg total) by mouth 2 (two) times daily. Take 1 tablet by mouth as needed every 6 hours for AFIB HR >100 45 tablet 1  . rivaroxaban (XARELTO) 20 MG TABS tablet Take 1 tablet (20 mg total) by mouth daily. 30 tablet 3   No current facility-administered medications for this encounter.     No Known Allergies  Social History   Socioeconomic History  . Marital status: Married    Spouse name: Darin Garcia  . Number of children: Not on file  . Years of education: Not on file  . Highest education level: Not on file  Occupational History    Employer: [employer]  Social Needs  . Financial resource strain: Not very hard  . Food insecurity    Worry: Never true    Inability: Never true  .  Transportation needs    Medical: No    Non-medical: No  Tobacco Use  . Smoking status: Former Smoker    Packs/day: 1.00    Years: 4.00    Pack years: 4.00    Types: Cigarettes    Quit date: 11/23/1993    Years since quitting: 25.7  . Smokeless tobacco: Never Used  Substance and Sexual Activity  . Alcohol use: Yes    Comment: occ  . Drug use: Never  . Sexual activity: Not on file  Lifestyle  .  Physical activity    Days per week: 4 days    Minutes per session: 30 min  . Stress: Only a little  Relationships  . Social Herbalist on phone: Not on file    Gets together: Not on file    Attends religious service: Not on file    Active member of club or organization: Not on file    Attends meetings of clubs or organizations: Not on file    Relationship status: Not on file  . Intimate partner violence    Fear of current or ex partner: Not on file    Emotionally abused: Not on file    Physically abused: Not on file    Forced sexual activity: Not on file  Other Topics Concern  . Not on file  Social History Narrative   He is originally from [place].  He moved to [place] 3 years ago.  He is married.  He has 2 children (23 and 18).  He works in Press photographer for [employer].     ROS- All systems are reviewed and negative except as per the HPI above.  Physical Exam: Vitals:   08/08/19 1539  BP: 120/88  Pulse: (!) 125  Weight: 115.6 kg  Height: 6\' 3"  (1.905 m)    GEN- The patient is well appearing obese male, alert and oriented x 3 today.   Head- normocephalic, atraumatic Eyes-  Sclera clear, conjunctiva pink Ears- hearing intact Oropharynx- clear Neck- supple  Lungs- Clear to ausculation bilaterally, normal work of breathing Heart- irregular rate and rhythm, no murmurs, rubs or gallops  GI- soft, NT, ND, + BS Extremities- no clubbing, cyanosis. Trace edema LLE MS- no significant deformity or atrophy Skin- no rash or lesion Psych- euthymic mood, full affect Neuro- strength and sensation are intact  Wt Readings from Last 3 Encounters:  08/08/19 115.6 kg  05/08/19 108.9 kg  04/04/19 108.9 kg    EKG today demonstrates typical atrial flutter with variable conduction HR 125, slow R wave prog, QRS 180, QTc 407  Echo 11/24/18 demonstrated  Left ventricle: The cavity size was normal. Wall thickness was   increased in a pattern of mild LVH. Systolic function was  mildly   reduced. The estimated ejection fraction was in the range of 45%   to 50%. Diffuse hypokinesis. The study is not technically   sufficient to allow evaluation of LV diastolic function. - Aortic root: The aortic root was mildly dilated. - Right ventricle: The cavity size was moderately dilated.  Impressions:  - Mild global reduction in LV systolic function; mild LVH; mildly   dilated aortic root; moderate RVE.  Epic records are reviewed at length today  Assessment and Plan:  1. Atrial flutter S/p typical flutter ablation 04/04/19 with Dr Rayann Heman. Unfortunately, patient appears to be back in atrial flutter with rapid rates. We discussed therapeutic options including DCCV +/-TEE and repeat ablation. Patient is open to repeat ablation. Will  d/w Dr Johney Frame. Increase Xarelto to 20 mg daily Increase Lopressor to 50 mg BID for better rate control.   This patients CHA2DS2-VASc Score and unadjusted Ischemic Stroke Rate (% per year) is equal to 0.6 % stroke rate/year from a score of 1  Above score calculated as 1 point each if present [CHF, HTN, DM, Vascular=MI/PAD/Aortic Plaque, Age if 65-74, or Male] Above score calculated as 2 points each if present [Age > 75, or Stroke/TIA/TE]   2. Obesity Body mass index is 31.85 kg/m. Lifestyle modification was discussed at length including regular exercise and weight reduction.    Will discuss with Dr Johney Frame regarding timing of ablation vs DCCV. Patient to call clinic in two days with status update.   Jorja Loa PA-C [facility] [address] [place], [place] [address] [phone] 08/08/2019 4:15 PM

## 2019-08-08 NOTE — Patient Instructions (Signed)
Increase metoprolol to 50mg  twice a day  Increase xarelto to 20mg  a day  Call on Thursday with update on heart rates - Raven Furnas RN

## 2019-08-08 NOTE — H&P (View-Only) (Signed)
  Primary Care Physician: Velazquez, Gretchen Y., MD Primary Cardiologist: Dr Skains Primary Electrophysiologist: Dr Allred Referring Physician: Dr Allred   Darin Garcia is a 58 y.o. male with a history of typical atrial flutter s/p ablation, recurrent DVT and pulmonary embolism, cardiomyopathy (EF is 45-50% in the setting of rapidly conducting atrial flutter) who presents for follow up in the [facility]. Patient had done very well post ablation until 08/06/19 when he noted dizziness and SOB with exertion as well as elevated heart rates. Kardia strips reviewed by Dr Allred and could not be interpreted 2/2 artifact. Patient appears to be back in typical atrial flutter today with rapid rates. He denies any specific triggers. He denies any significant alcohol use. He had a sleep study 3 years ago which was negative.   Today, he denies symptoms of chest pain, orthopnea, PND, lower extremity edema, presyncope, syncope, daytime somnolence, bleeding, or neurologic sequela. The patient is tolerating medications without difficulties and is otherwise without complaint today.    Atrial Fibrillation Risk Factors:  he does not have symptoms or diagnosis of sleep apnea. Negative sleep study. he does not have a history of rheumatic fever. he does not have a history of alcohol use.   he has a BMI of Body mass index is 31.85 kg/m.. Filed Weights   08/08/19 1539  Weight: 115.6 kg    Family History  Problem Relation Age of Onset  . Pulmonary embolism Father   . CAD Sister      Atrial Fibrillation Management history:  Previous antiarrhythmic drugs: none Previous cardioversions: 11/25/18 Previous ablations: 04/04/19 flutter CHADS2VASC score: 1 Anticoagulation history: Xarelto (has been on 10 mg for h/o DVT and PE)   Past Medical History:  Diagnosis Date  . Atrial flutter (HCC)    a. s/p TEE DCCV 11/2018.  . Cardiomyopathy (HCC)   . Dyspnea   . GERD  (gastroesophageal reflux disease)   . History of DVT (deep vein thrombosis)   . Pulmonary embolism (HCC)    Past Surgical History:  Procedure Laterality Date  . A-FLUTTER ABLATION N/A 04/04/2019   Procedure: A-FLUTTER ABLATION;  Surgeon: Allred, James, MD;  [facility]: [facility];  Service: Cardiovascular;  Laterality: N/A;  . CARDIOVERSION N/A 11/25/2018   Procedure: CARDIOVERSION;  Surgeon: Crenshaw, Brian S, MD;  Location: [facility];  Service: Cardiovascular;  Laterality: N/A;  . TEE WITHOUT CARDIOVERSION N/A 11/25/2018   Procedure: TRANSESOPHAGEAL ECHOCARDIOGRAM (TEE);  Surgeon: Crenshaw, Brian S, MD;  Location: [facility];  Service: Cardiovascular;  Laterality: N/A;    Current Outpatient Medications  Medication Sig Dispense Refill  . metoprolol tartrate (LOPRESSOR) 25 MG tablet Take 2 tablets (50 mg total) by mouth 2 (two) times daily. Take 1 tablet by mouth as needed every 6 hours for AFIB HR >100 45 tablet 1  . rivaroxaban (XARELTO) 20 MG TABS tablet Take 1 tablet (20 mg total) by mouth daily. 30 tablet 3   No current facility-administered medications for this encounter.     No Known Allergies  Social History   Socioeconomic History  . Marital status: Married    Spouse name: michelle  . Number of children: Not on file  . Years of education: Not on file  . Highest education level: Not on file  Occupational History    Employer: [employer]  Social Needs  . Financial resource strain: Not very hard  . Food insecurity    Worry: Never true    Inability: Never true  .   Transportation needs    Medical: No    Non-medical: No  Tobacco Use  . Smoking status: Former Smoker    Packs/day: 1.00    Years: 4.00    Pack years: 4.00    Types: Cigarettes    Quit date: 11/23/1993    Years since quitting: 25.7  . Smokeless tobacco: Never Used  Substance and Sexual Activity  . Alcohol use: Yes    Comment: occ  . Drug use: Never  . Sexual activity: Not on file  Lifestyle  .  Physical activity    Days per week: 4 days    Minutes per session: 30 min  . Stress: Only a little  Relationships  . Social Herbalist on phone: Not on file    Gets together: Not on file    Attends religious service: Not on file    Active member of club or organization: Not on file    Attends meetings of clubs or organizations: Not on file    Relationship status: Not on file  . Intimate partner violence    Fear of current or ex partner: Not on file    Emotionally abused: Not on file    Physically abused: Not on file    Forced sexual activity: Not on file  Other Topics Concern  . Not on file  Social History Narrative   He is originally from [place].  He moved to [place] 3 years ago.  He is married.  He has 2 children (23 and 18).  He works in Press photographer for [employer].     ROS- All systems are reviewed and negative except as per the HPI above.  Physical Exam: Vitals:   08/08/19 1539  BP: 120/88  Pulse: (!) 125  Weight: 115.6 kg  Height: 6\' 3"  (1.905 m)    GEN- The patient is well appearing obese male, alert and oriented x 3 today.   Head- normocephalic, atraumatic Eyes-  Sclera clear, conjunctiva pink Ears- hearing intact Oropharynx- clear Neck- supple  Lungs- Clear to ausculation bilaterally, normal work of breathing Heart- irregular rate and rhythm, no murmurs, rubs or gallops  GI- soft, NT, ND, + BS Extremities- no clubbing, cyanosis. Trace edema LLE MS- no significant deformity or atrophy Skin- no rash or lesion Psych- euthymic mood, full affect Neuro- strength and sensation are intact  Wt Readings from Last 3 Encounters:  08/08/19 115.6 kg  05/08/19 108.9 kg  04/04/19 108.9 kg    EKG today demonstrates typical atrial flutter with variable conduction HR 125, slow R wave prog, QRS 180, QTc 407  Echo 11/24/18 demonstrated  Left ventricle: The cavity size was normal. Wall thickness was   increased in a pattern of mild LVH. Systolic function was  mildly   reduced. The estimated ejection fraction was in the range of 45%   to 50%. Diffuse hypokinesis. The study is not technically   sufficient to allow evaluation of LV diastolic function. - Aortic root: The aortic root was mildly dilated. - Right ventricle: The cavity size was moderately dilated.  Impressions:  - Mild global reduction in LV systolic function; mild LVH; mildly   dilated aortic root; moderate RVE.  Epic records are reviewed at length today  Assessment and Plan:  1. Atrial flutter S/p typical flutter ablation 04/04/19 with Dr Rayann Heman. Unfortunately, patient appears to be back in atrial flutter with rapid rates. We discussed therapeutic options including DCCV +/-TEE and repeat ablation. Patient is open to repeat ablation. Will  d/w Dr Johney Frame. Increase Xarelto to 20 mg daily Increase Lopressor to 50 mg BID for better rate control.   This patients CHA2DS2-VASc Score and unadjusted Ischemic Stroke Rate (% per year) is equal to 0.6 % stroke rate/year from a score of 1  Above score calculated as 1 point each if present [CHF, HTN, DM, Vascular=MI/PAD/Aortic Plaque, Age if 65-74, or Male] Above score calculated as 2 points each if present [Age > 75, or Stroke/TIA/TE]   2. Obesity Body mass index is 31.85 kg/m. Lifestyle modification was discussed at length including regular exercise and weight reduction.    Will discuss with Dr Johney Frame regarding timing of ablation vs DCCV. Patient to call clinic in two days with status update.   Jorja Loa PA-C [facility] [address] [place], [place] [address] [phone] 08/08/2019 4:15 PM

## 2019-08-09 ENCOUNTER — Telehealth: Payer: Self-pay

## 2019-08-09 DIAGNOSIS — Z01812 Encounter for preprocedural laboratory examination: Secondary | ICD-10-CM

## 2019-08-09 DIAGNOSIS — I4892 Unspecified atrial flutter: Secondary | ICD-10-CM

## 2019-08-09 NOTE — Telephone Encounter (Addendum)
Pt advised his pretesting and TEE/ Ablation instructions. Will send letter to his My Chart.

## 2019-08-11 ENCOUNTER — Other Ambulatory Visit: Payer: Managed Care, Other (non HMO) | Admitting: *Deleted

## 2019-08-11 ENCOUNTER — Other Ambulatory Visit (HOSPITAL_COMMUNITY)
Admission: RE | Admit: 2019-08-11 | Discharge: 2019-08-11 | Disposition: A | Discharge status: Home / Self Care | Payer: Managed Care, Other (non HMO) | Source: Ambulatory Visit | Attending: Internal Medicine | Admitting: Internal Medicine

## 2019-08-11 DIAGNOSIS — Z01812 Encounter for preprocedural laboratory examination: Secondary | ICD-10-CM | POA: Insufficient documentation

## 2019-08-11 DIAGNOSIS — Z20828 Contact with and (suspected) exposure to other viral communicable diseases: Secondary | ICD-10-CM | POA: Insufficient documentation

## 2019-08-11 LAB — CBC WITH DIFFERENTIAL/PLATELET
Basophils Absolute: 0.1 10*3/uL (ref 0.0–0.2)
Basos: 1 %
EOS (ABSOLUTE): 0.1 10*3/uL (ref 0.0–0.4)
Eos: 2 %
Hematocrit: 48.6 % (ref 37.5–51.0)
Hemoglobin: 17 g/dL (ref 13.0–17.7)
Immature Grans (Abs): 0 10*3/uL (ref 0.0–0.1)
Immature Granulocytes: 1 %
Lymphocytes Absolute: 2.8 10*3/uL (ref 0.7–3.1)
Lymphs: 46 %
MCH: 31.2 pg (ref 26.6–33.0)
MCHC: 35 g/dL (ref 31.5–35.7)
MCV: 89 fL (ref 79–97)
Monocytes Absolute: 0.6 10*3/uL (ref 0.1–0.9)
Monocytes: 9 %
Neutrophils Absolute: 2.5 10*3/uL (ref 1.4–7.0)
Neutrophils: 41 %
Platelets: 290 10*3/uL (ref 150–450)
RBC: 5.45 x10E6/uL (ref 4.14–5.80)
RDW: 11.9 % (ref 11.6–15.4)
WBC: 6.1 10*3/uL (ref 3.4–10.8)

## 2019-08-11 LAB — BASIC METABOLIC PANEL
BUN/Creatinine Ratio: 11 (ref 9–20)
BUN: 13 mg/dL (ref 6–24)
CO2: 25 mmol/L (ref 20–29)
Calcium: 9.4 mg/dL (ref 8.7–10.2)
Chloride: 100 mmol/L (ref 96–106)
Creatinine, Ser: 1.15 mg/dL (ref 0.76–1.27)
GFR calc Af Amer: 82 mL/min/{1.73_m2} (ref >59)
GFR calc non Af Amer: 71 mL/min/{1.73_m2} (ref >59)
Glucose: 99 mg/dL (ref 65–99)
Potassium: 4.5 mmol/L (ref 3.5–5.2)
Sodium: 138 mmol/L (ref 134–144)

## 2019-08-12 LAB — NOVEL CORONAVIRUS, NAA (HOSP ORDER, SEND-OUT TO REF LAB; TAT 18-24 HRS): SARS-CoV-2, NAA: NOT DETECTED

## 2019-08-14 NOTE — Progress Notes (Signed)
Spoke with patient who states that he has remained in quarantine and is not experiencing any s/s of COVID 19 since his screening. All questions answered appropriately. Tyisha Cressy, RN 

## 2019-08-15 ENCOUNTER — Ambulatory Visit (HOSPITAL_BASED_OUTPATIENT_CLINIC_OR_DEPARTMENT_OTHER): Payer: Managed Care, Other (non HMO)

## 2019-08-15 ENCOUNTER — Ambulatory Visit (HOSPITAL_COMMUNITY): Payer: Managed Care, Other (non HMO) | Admitting: Anesthesiology

## 2019-08-15 ENCOUNTER — Encounter (HOSPITAL_COMMUNITY): Payer: Self-pay

## 2019-08-15 ENCOUNTER — Encounter (HOSPITAL_COMMUNITY)
Admission: RE | Disposition: A | Discharge status: Home / Self Care | Payer: Self-pay | Source: Home / Self Care | Attending: Cardiology

## 2019-08-15 ENCOUNTER — Ambulatory Visit (HOSPITAL_COMMUNITY)
Admission: RE | Admit: 2019-08-15 | Discharge: 2019-08-15 | Disposition: A | Discharge status: Home / Self Care | Payer: Managed Care, Other (non HMO) | Attending: Internal Medicine | Admitting: Internal Medicine

## 2019-08-15 ENCOUNTER — Other Ambulatory Visit: Payer: Self-pay

## 2019-08-15 DIAGNOSIS — E669 Obesity, unspecified: Secondary | ICD-10-CM | POA: Diagnosis not present

## 2019-08-15 DIAGNOSIS — Z79899 Other long term (current) drug therapy: Secondary | ICD-10-CM | POA: Diagnosis not present

## 2019-08-15 DIAGNOSIS — Z6831 Body mass index (BMI) 31.0-31.9, adult: Secondary | ICD-10-CM | POA: Diagnosis not present

## 2019-08-15 DIAGNOSIS — Z7901 Long term (current) use of anticoagulants: Secondary | ICD-10-CM | POA: Diagnosis not present

## 2019-08-15 DIAGNOSIS — Z86718 Personal history of other venous thrombosis and embolism: Secondary | ICD-10-CM | POA: Insufficient documentation

## 2019-08-15 DIAGNOSIS — I4891 Unspecified atrial fibrillation: Secondary | ICD-10-CM | POA: Diagnosis not present

## 2019-08-15 DIAGNOSIS — Z87891 Personal history of nicotine dependence: Secondary | ICD-10-CM | POA: Diagnosis not present

## 2019-08-15 DIAGNOSIS — K219 Gastro-esophageal reflux disease without esophagitis: Secondary | ICD-10-CM | POA: Insufficient documentation

## 2019-08-15 DIAGNOSIS — I483 Typical atrial flutter: Secondary | ICD-10-CM | POA: Diagnosis present

## 2019-08-15 DIAGNOSIS — I4892 Unspecified atrial flutter: Secondary | ICD-10-CM

## 2019-08-15 DIAGNOSIS — I340 Nonrheumatic mitral (valve) insufficiency: Secondary | ICD-10-CM | POA: Diagnosis not present

## 2019-08-15 DIAGNOSIS — Z86711 Personal history of pulmonary embolism: Secondary | ICD-10-CM | POA: Insufficient documentation

## 2019-08-15 DIAGNOSIS — I429 Cardiomyopathy, unspecified: Secondary | ICD-10-CM | POA: Insufficient documentation

## 2019-08-15 HISTORY — PX: TEE WITHOUT CARDIOVERSION: SHX5443

## 2019-08-15 HISTORY — PX: ATRIAL FLUTTER ABLATION: SHX5733

## 2019-08-15 HISTORY — PX: A-FLUTTER ABLATION: EP1230

## 2019-08-15 SURGERY — A-FLUTTER ABLATION
Anesthesia: General

## 2019-08-15 SURGERY — ECHOCARDIOGRAM, TRANSESOPHAGEAL
Anesthesia: Moderate Sedation

## 2019-08-15 MED ORDER — LIDOCAINE 2% (20 MG/ML) 5 ML SYRINGE
INTRAMUSCULAR | Status: DC | PRN
  Administered 2019-08-15: 80 mg via INTRAVENOUS

## 2019-08-15 MED ORDER — RIVAROXABAN 20 MG PO TABS
20.0000 mg | ORAL_TABLET | Freq: Every day | ORAL | Status: DC
  Administered 2019-08-15: 20 mg via ORAL
  Filled 2019-08-15: qty 1

## 2019-08-15 MED ORDER — SODIUM CHLORIDE 0.9 % IV SOLN: 250.0000 mL | INTRAVENOUS | Status: DC | PRN

## 2019-08-15 MED ORDER — HYDROCODONE-ACETAMINOPHEN 5-325 MG PO TABS: 1.0000 | ORAL_TABLET | ORAL | Status: DC | PRN

## 2019-08-15 MED ORDER — ONDANSETRON HCL 4 MG/2ML IJ SOLN
INTRAMUSCULAR | Status: DC | PRN
  Administered 2019-08-15: 4 mg via INTRAVENOUS

## 2019-08-15 MED ORDER — ROCURONIUM BROMIDE 10 MG/ML (PF) SYRINGE
PREFILLED_SYRINGE | INTRAVENOUS | Status: DC | PRN
  Administered 2019-08-15: 60 mg via INTRAVENOUS
  Administered 2019-08-15 (×2): 20 mg via INTRAVENOUS

## 2019-08-15 MED ORDER — SODIUM CHLORIDE 0.9 % IV SOLN: INTRAVENOUS | Status: DC

## 2019-08-15 MED ORDER — DEXAMETHASONE SODIUM PHOSPHATE 10 MG/ML IJ SOLN
INTRAMUSCULAR | Status: DC | PRN
  Administered 2019-08-15: 5 mg via INTRAVENOUS

## 2019-08-15 MED ORDER — BUTAMBEN-TETRACAINE-BENZOCAINE 2-2-14 % EX AERO
INHALATION_SPRAY | CUTANEOUS | Status: DC | PRN
  Administered 2019-08-15: 11:00:00 2 via TOPICAL

## 2019-08-15 MED ORDER — SODIUM CHLORIDE 0.9% FLUSH: 3.0000 mL | INTRAVENOUS | Status: DC | PRN

## 2019-08-15 MED ORDER — ONDANSETRON HCL 4 MG/2ML IJ SOLN: 4.0000 mg | Freq: Four times a day (QID) | INTRAMUSCULAR | Status: DC | PRN

## 2019-08-15 MED ORDER — SUGAMMADEX SODIUM 200 MG/2ML IV SOLN
INTRAVENOUS | Status: DC | PRN
  Administered 2019-08-15: 300 mg via INTRAVENOUS

## 2019-08-15 MED ORDER — PROPOFOL 10 MG/ML IV BOLUS
INTRAVENOUS | Status: DC | PRN
  Administered 2019-08-15: 150 mg via INTRAVENOUS

## 2019-08-15 MED ORDER — FENTANYL CITRATE (PF) 250 MCG/5ML IJ SOLN
INTRAMUSCULAR | Status: DC | PRN
  Administered 2019-08-15 (×2): 50 ug via INTRAVENOUS

## 2019-08-15 MED ORDER — HEPARIN SODIUM (PORCINE) 1000 UNIT/ML IJ SOLN
INTRAMUSCULAR | Status: AC
  Filled 2019-08-15: qty 1

## 2019-08-15 MED ORDER — MIDAZOLAM HCL (PF) 10 MG/2ML IJ SOLN
INTRAMUSCULAR | Status: DC | PRN
  Administered 2019-08-15 (×3): 2 mg via INTRAVENOUS

## 2019-08-15 MED ORDER — FENTANYL CITRATE (PF) 100 MCG/2ML IJ SOLN
INTRAMUSCULAR | Status: AC
  Filled 2019-08-15: qty 2

## 2019-08-15 MED ORDER — SODIUM CHLORIDE 0.9 % IV SOLN
INTRAVENOUS | Status: DC
  Administered 2019-08-15 (×2): via INTRAVENOUS

## 2019-08-15 MED ORDER — DIPHENHYDRAMINE HCL 50 MG/ML IJ SOLN
INTRAMUSCULAR | Status: AC
  Filled 2019-08-15: qty 1

## 2019-08-15 MED ORDER — HEPARIN SODIUM (PORCINE) 1000 UNIT/ML IJ SOLN
INTRAMUSCULAR | Status: DC | PRN
  Administered 2019-08-15: 1000 [IU] via INTRAVENOUS

## 2019-08-15 MED ORDER — MIDAZOLAM HCL 2 MG/2ML IJ SOLN
INTRAMUSCULAR | Status: AC
  Filled 2019-08-15: qty 2

## 2019-08-15 MED ORDER — FENTANYL CITRATE (PF) 100 MCG/2ML IJ SOLN
INTRAMUSCULAR | Status: DC | PRN
  Administered 2019-08-15 (×3): 25 ug via INTRAVENOUS

## 2019-08-15 MED ORDER — MIDAZOLAM HCL (PF) 5 MG/ML IJ SOLN
INTRAMUSCULAR | Status: AC
  Filled 2019-08-15: qty 2

## 2019-08-15 MED ORDER — ACETAMINOPHEN 325 MG PO TABS: 650.0000 mg | ORAL_TABLET | ORAL | Status: DC | PRN

## 2019-08-15 MED ORDER — EPHEDRINE SULFATE-NACL 50-0.9 MG/10ML-% IV SOSY
PREFILLED_SYRINGE | INTRAVENOUS | Status: DC | PRN
  Administered 2019-08-15: 5 mg via INTRAVENOUS

## 2019-08-15 MED ORDER — SODIUM CHLORIDE 0.9% FLUSH: 3.0000 mL | Freq: Two times a day (BID) | INTRAVENOUS | Status: DC

## 2019-08-15 MED ORDER — BUPIVACAINE HCL (PF) 0.25 % IJ SOLN
INTRAMUSCULAR | Status: DC | PRN
  Administered 2019-08-15: 20 mL

## 2019-08-15 MED ORDER — SODIUM CHLORIDE 0.9 % IV SOLN
INTRAVENOUS | Status: DC | PRN
  Administered 2019-08-15: 25 ug/min via INTRAVENOUS

## 2019-08-15 MED ORDER — MIDAZOLAM HCL 2 MG/2ML IJ SOLN
INTRAMUSCULAR | Status: DC | PRN
  Administered 2019-08-15: 2 mg via INTRAVENOUS

## 2019-08-15 MED ORDER — BUPIVACAINE HCL (PF) 0.25 % IJ SOLN
INTRAMUSCULAR | Status: AC
  Filled 2019-08-15: qty 30

## 2019-08-15 MED ORDER — PHENYLEPHRINE 40 MCG/ML (10ML) SYRINGE FOR IV PUSH (FOR BLOOD PRESSURE SUPPORT)
PREFILLED_SYRINGE | INTRAVENOUS | Status: DC | PRN
  Administered 2019-08-15: 80 ug via INTRAVENOUS
  Administered 2019-08-15: 40 ug via INTRAVENOUS
  Administered 2019-08-15: 120 ug via INTRAVENOUS

## 2019-08-15 SURGICAL SUPPLY — 12 items
BLANKET WARM UNDERBOD FULL ACC (MISCELLANEOUS) ×3 IMPLANT
CATH DUODECA HALO/ISMUS 7FR (CATHETERS) ×3 IMPLANT
CATH NAVISTAR SMARTTOUCH DF (ABLATOR) ×3 IMPLANT
CATH WEBSTER BI DIR CS D-F CRV (CATHETERS) ×3 IMPLANT
PACK EP LATEX FREE (CUSTOM PROCEDURE TRAY) ×2
PACK EP LF (CUSTOM PROCEDURE TRAY) ×1 IMPLANT
PAD PRO RADIOLUCENT 2001M-C (PAD) ×3 IMPLANT
PATCH CARTO3 (PAD) ×3 IMPLANT
SHEATH PINNACLE 7F 10CM (SHEATH) ×3 IMPLANT
SHEATH PINNACLE 8F 10CM (SHEATH) ×3 IMPLANT
SHEATH PROBE COVER 6X72 (BAG) ×3 IMPLANT
TUBING SMART ABLATE COOLFLOW (TUBING) ×3 IMPLANT

## 2019-08-15 NOTE — Progress Notes (Signed)
Figure 8 stitch removed. Site CDI.  Tegaderm and Gauze used at dressing.  Patient tol V Well

## 2019-08-15 NOTE — Anesthesia Procedure Notes (Signed)
Procedure Name: Intubation Date/Time: 08/15/2019 1:27 PM Performed by: Kathryne Hitch, CRNA Pre-anesthesia Checklist: Patient identified, Emergency Drugs available, Suction available and Patient being monitored Patient Re-evaluated:Patient Re-evaluated prior to induction Oxygen Delivery Method: Circle system utilized Preoxygenation: Pre-oxygenation with 100% oxygen Induction Type: IV induction Ventilation: Mask ventilation without difficulty Laryngoscope Size: Miller and 2 Grade View: Grade I Tube type: Oral Tube size: 8.0 mm Number of attempts: 1 Airway Equipment and Method: Stylet and Oral airway Placement Confirmation: ETT inserted through vocal cords under direct vision,  positive ETCO2 and breath sounds checked- equal and bilateral Secured at: 23 cm Tube secured with: Tape Dental Injury: Teeth and Oropharynx as per pre-operative assessment  Comments: Moser, MDA DVL x1.

## 2019-08-15 NOTE — Progress Notes (Signed)
Patient cleared for discharge. A&Ox4. No complaints of pain. VSS.

## 2019-08-15 NOTE — Progress Notes (Addendum)
Discharge instructions reviewed with the patient by primary RN.  Pt. Demonstrated understanding. Pt. Discharged to home.

## 2019-08-15 NOTE — Discharge Instructions (Signed)
Post ABLATION procedure care instructions No driving for 4 days. No lifting over 5 lbs for 1 week. No vigorous or sexual activity for 1 week. You may return to work on 9/29;2020. Keep procedure site clean & dry. If you notice increased pain, swelling, bleeding or pus, call/return!  You may shower, but no soaking baths/hot tubs/pools for 1 week.      TEE  YOU HAD AN CARDIAC PROCEDURE TODAY: Refer to the procedure report and other information in the discharge instructions given to you for any specific questions about what was found during the examination. If this information does not answer your questions, please call [facility] office at [phone] to clarify.   DIET: Your first meal following the procedure should be a light meal and then it is ok to progress to your normal diet. A half-sandwich or bowl of soup is an example of a good first meal. Heavy or fried foods are harder to digest and may make you feel nauseous or bloated. Drink plenty of fluids but you should avoid alcoholic beverages for 24 hours. If you had a esophageal dilation, please see attached instructions for diet.   ACTIVITY: Your care partner should take you home directly after the procedure. You should plan to take it easy, moving slowly for the rest of the day. You can resume normal activity the day after the procedure however YOU SHOULD NOT DRIVE, use power tools, machinery or perform tasks that involve climbing or major physical exertion for 24 hours (because of the sedation medicines used during the test).   SYMPTOMS TO REPORT IMMEDIATELY: A cardiologist can be reached at any hour. Please call [phone] for any of the following symptoms:  Vomiting of blood or coffee ground material  New, significant abdominal pain  New, significant chest pain or pain under the shoulder blades  Painful or persistently difficult swallowing  New shortness of breath  Black, tarry-looking or red, bloody stools  FOLLOW UP:  Please  also call with any specific questions about appointments or follow up tests.

## 2019-08-15 NOTE — Interval H&P Note (Signed)
History and Physical Interval Note:  08/15/2019 10:43 AM  Darin Garcia  has presented today for surgery, with the diagnosis of AFIB.  The various methods of treatment have been discussed with the patient and family. After consideration of risks, benefits and other options for treatment, the patient has consented to  Procedure(s): TRANSESOPHAGEAL ECHOCARDIOGRAM (TEE) (N/A) as a surgical intervention.  The patient's history has been reviewed, patient examined, no change in status, stable for surgery.  I have reviewed the patient's chart and labs.  Questions were answered to the patient's satisfaction.     [employer]

## 2019-08-15 NOTE — Interval H&P Note (Signed)
History and Physical Interval Note:  08/15/2019 9:53 AM  Renato Gails  has presented today for surgery, with the diagnosis of Atrial flutter.  The various methods of treatment have been discussed with the patient and family. After consideration of risks, benefits and other options for treatment, the patient has consented to  Procedure(s): A-FLUTTER ABLATION (N/A) as a surgical intervention.  The patient's history has been reviewed, patient examined, no change in status, stable for surgery.  I have reviewed the patient's chart and labs.  Questions were answered to the patient's satisfaction.     The patient has had recurrence of typical appearing atrial flutter.  He has not had clinical afib. He is now on xarelto 20mg  daily (previously on 10mg  daily).  He will undergo TEE by Dr Marlou Porch this am.  We will then plan to proceed with atrial flutter ablation.  Therapeutic strategies for atrial flutter were discussed in detail with the patient today. Risk, benefits, and alternatives to EP study and radiofrequency ablation were also discussed in detail today. These risks include but are not limited to stroke, bleeding, vascular damage, tamponade, perforation, damage to the heart and other structures, AV block requiring pacemaker, worsening renal function, and death. The patient understands these risk and wishes to proceed.     Thompson Grayer

## 2019-08-15 NOTE — Anesthesia Preprocedure Evaluation (Signed)
Anesthesia Evaluation  Patient identified by MRN, date of birth, ID band Patient awake    Reviewed: Allergy & Precautions, NPO status , Patient's Chart, lab work & pertinent test results, reviewed documented beta blocker date and time   History of Anesthesia Complications Negative for: history of anesthetic complications  Airway Mallampati: I  TM Distance: >3 FB Neck ROM: Full    Dental  (+) Dental Advisory Given   Pulmonary shortness of breath, former smoker, PE   breath sounds clear to auscultation       Cardiovascular hypertension, Pt. on medications and Pt. on home beta blockers (-) angina+ DVT  + dysrhythmias Atrial Fibrillation  Rhythm:Regular Rate:Bradycardia  11/2018 stress: EF 54%, no ischemia, no scar   Neuro/Psych negative neurological ROS     GI/Hepatic Neg liver ROS, GERD  ,  Endo/Other  negative endocrine ROS  Renal/GU negative Renal ROS     Musculoskeletal   Abdominal   Peds  Hematology Xarelto   Anesthesia Other Findings   Reproductive/Obstetrics                            Anesthesia Physical  Anesthesia Plan  ASA: III  Anesthesia Plan: General   Post-op Pain Management:    Induction: Intravenous  PONV Risk Score and Plan: 2 and Ondansetron, Dexamethasone and Treatment may vary due to age or medical condition  Airway Management Planned: Oral ETT  Additional Equipment: None  Intra-op Plan:   Post-operative Plan: Extubation in OR  Informed Consent: I have reviewed the patients History and Physical, chart, labs and discussed the procedure including the risks, benefits and alternatives for the proposed anesthesia with the patient or authorized representative who has indicated his/her understanding and acceptance.     Dental advisory given  Plan Discussed with: CRNA  Anesthesia Plan Comments:        Anesthesia Quick Evaluation

## 2019-08-15 NOTE — Progress Notes (Signed)
Echocardiogram Echocardiogram Transesophageal has been performed.  Oneal Deputy Blue Winther 08/15/2019, 11:33 AM

## 2019-08-15 NOTE — CV Procedure (Signed)
   Transesophageal Echocardiogram  Indications: AFIB pre ablation  Time out performed  During this procedure the patient is administered a total of Versed 6 mg and Fentanyl 75 mcg to achieve and maintain moderate conscious sedation.  The patient's heart rate, blood pressure, and oxygen saturation are monitored continuously during the procedure. The period of conscious sedation is 12 minutes, of which I was present face-to-face 100% of this time.  Findings:  Left Ventricle: EF 45-50%   Mitral Valve: Trace to mild MR  Aortic Valve: Normal  Tricuspid Valve: Mild TR  Left Atrium: Normal with no LAA thrombus   Candee Furbish, MD

## 2019-08-16 ENCOUNTER — Encounter (HOSPITAL_COMMUNITY): Payer: Self-pay | Admitting: Internal Medicine

## 2019-08-16 NOTE — Anesthesia Postprocedure Evaluation (Signed)
Anesthesia Post Note  Patient: Darin Garcia  Procedure(s) Performed: A-FLUTTER ABLATION (N/A )     Patient location during evaluation: PACU Anesthesia Type: General Level of consciousness: sedated and patient cooperative Pain management: pain level controlled Vital Signs Assessment: post-procedure vital signs reviewed and stable Respiratory status: spontaneous breathing Cardiovascular status: stable Anesthetic complications: no    Last Vitals:  Vitals:   08/15/19 1800 08/15/19 2010  BP: 120/78 129/73  Pulse: 80 85  Resp:  18  Temp:  36.5 C  SpO2: 98% 96%    Last Pain:  Vitals:   08/15/19 2010  TempSrc: Oral  PainSc:                  Nolon Nations

## 2019-08-16 NOTE — Transfer of Care (Addendum)
Immediate Anesthesia Transfer of Care Note  Patient: Darin Garcia  Procedure(s) Performed: A-FLUTTER ABLATION (N/A )  Patient Location: PACU  Anesthesia Type:General  Level of Consciousness: awake and alert   Airway & Oxygen Therapy: Patient Spontanous Breathing  Post-op Assessment: Post -op Vital signs reviewed and stable and Patient moving all extremities  Post vital signs: Reviewed  Last Vitals:  Vitals Value Taken Time  BP 129/73 08/15/19 2010  Temp 36.5 C 08/15/19 2010  Pulse 85 08/15/19 2010  Resp 18 08/15/19 2010  SpO2 96 % 08/15/19 2010    Last Pain:  Vitals:   08/15/19 2010  TempSrc: Oral  PainSc:          Complications: No apparent anesthesia complications

## 2019-09-18 ENCOUNTER — Telehealth: Payer: Self-pay

## 2019-09-18 ENCOUNTER — Telehealth (INDEPENDENT_AMBULATORY_CARE_PROVIDER_SITE_OTHER): Payer: Managed Care, Other (non HMO) | Admitting: Internal Medicine

## 2019-09-18 ENCOUNTER — Encounter: Payer: Self-pay | Admitting: Internal Medicine

## 2019-09-18 VITALS — HR 63 | Ht 75.0 in | Wt 240.0 lb

## 2019-09-18 DIAGNOSIS — I4892 Unspecified atrial flutter: Secondary | ICD-10-CM | POA: Diagnosis not present

## 2019-09-18 DIAGNOSIS — Z86711 Personal history of pulmonary embolism: Secondary | ICD-10-CM

## 2019-09-18 DIAGNOSIS — I429 Cardiomyopathy, unspecified: Secondary | ICD-10-CM

## 2019-09-18 NOTE — Telephone Encounter (Signed)
-----   Message from Thompson Grayer, MD sent at 09/18/2019  2:54 PM EDT ----- 3 months with Jonni Sanger Echo prior to the visit

## 2019-09-18 NOTE — Progress Notes (Signed)
Electrophysiology TeleHealth Note   Due to national recommendations of social distancing due to COVID 19, an audio/video telehealth visit is felt to be most appropriate for this patient at this time.  See MyChart message from today for the patient's consent to telehealth for [facility].  Date:  09/18/2019   ID:  Darin Garcia, DOB 1961/03/09, MRN [id number]  Location: patient's home  Provider location:  [place]   Evaluation Performed: Follow-up visit  PCP:  Cheron Schaumann., MD   Electrophysiologist:  Dr Johney Frame  Chief Complaint:  palpitations  History of Present Illness:    Darin Garcia is a 58 y.o. male who presents via telehealth conferencing today.  Since his ablation, the patient reports doing very well.  Today, he denies symptoms of palpitations, chest pain, shortness of breath,  lower extremity edema, dizziness, presyncope, or syncope.  The patient is otherwise without complaint today.  The patient denies symptoms of fevers, chills, cough, or new SOB worrisome for COVID 19.  Past Medical History:  Diagnosis Date  . Atrial flutter (HCC)    a. s/p TEE DCCV 11/2018.  [name] Cardiomyopathy (HCC)   . Dyspnea   . GERD (gastroesophageal reflux disease)   . History of DVT (deep vein thrombosis)   . Pulmonary embolism [facility])     Past Surgical History:  Procedure Laterality Date  . A-FLUTTER ABLATION N/A 04/04/2019   Procedure: A-FLUTTER ABLATION;  Surgeon: Hillis Range, MD;  Location: [facility] INVASIVE CV LAB;  Service: Cardiovascular;  Laterality: N/A;  . A-FLUTTER ABLATION N/A 08/15/2019   Procedure: A-FLUTTER ABLATION;  Surgeon: Hillis Range, MD;  Location: [facility] INVASIVE CV LAB;  Service: Cardiovascular;  Laterality: N/A;  . ATRIAL FLUTTER ABLATION  08/15/2019  . CARDIOVERSION N/A 11/25/2018   Procedure: CARDIOVERSION;  Surgeon: Lewayne Bunting, MD;  Location: [facility] ENDOSCOPY;  Service: Cardiovascular;  Laterality: N/A;  . TEE WITHOUT CARDIOVERSION N/A 11/25/2018   Procedure: TRANSESOPHAGEAL ECHOCARDIOGRAM (TEE);  Surgeon: Lewayne Bunting, MD;  Location[facility] ENDOSCOPY;  Service: Cardiovascular;  Laterality: N/A;  . TEE WITHOUT CARDIOVERSION  08/15/2019  . TEE WITHOUT CARDIOVERSION N/A 08/15/2019   Procedure: TRANSESOPHAGEAL ECHOCARDIOGRAM (TEE);  Surgeon: Jake Bathe, MD;  Location: [facility] ENDOSCOPY;  Service: Cardiovascular;  Laterality: N/A;    Current Outpatient Medications  Medication Sig Dispense Refill  . rivaroxaban (XARELTO) 20 MG TABS tablet Take 1 tablet (20 mg total) by mouth daily. 30 tablet 3   No current facility-administered medications for this visit.     Allergies:   Patient has no known allergies.   Social History:  The patient  reports that he quit smoking about 25 years ago. His smoking use included cigarettes. He has a 4.00 pack-year smoking history. He has never used smokeless tobacco. He reports current alcohol use. He reports that he does not use drugs.   Family History:  The patient's family history includes CAD in his sister; Pulmonary embolism in his father.   ROS:  Please see the history of present illness.   All other systems are personally reviewed and negative.    Exam:    Vital Signs:  Pulse 63   Ht 6\' 3"  (1.905 m)   Wt 240 lb (108.9 kg)   BMI 30.00 kg/m   Well sounding and appearing, alert and conversant, regular work of breathing,  good skin color Eyes- anicteric, neuro- grossly intact, skin- no apparent rash or lesions or cyanosis, mouth- oral mucosa is pink  Labs/Other Tests and Data Reviewed:  Recent Labs: 11/23/2018: ALT 14; TSH 1.657 08/11/2019: BUN 13; Creatinine, Ser 1.15; Hemoglobin 17.0; Platelets 290; Potassium 4.5; Sodium 138   Wt Readings from Last 3 Encounters:  09/18/19 240 lb (108.9 kg)  08/15/19 248 lb 1.6 oz (112.5 kg)  08/08/19 254 lb 12.8 oz (115.6 kg)    ASSESSMENT & PLAN:    1.  Atrial flutter Doing great post ablation Denies procedure related complications  2. PTE/DVT  Reduce xarelto to 10mg  daily  3. Cardiomyopathy EF 40-45% Likely tachy mediated Return to see EP APP with an echo in 3 months If EF is still depressed further evaluation/ management by general cardiology is advised  Follow-up:  3 months with EP APP with an echo   Patient Risk:  after full review of this patients clinical status, I feel that they are at moderate risk at this time.  Today, I have spent 15 minutes with the patient with telehealth technology discussing arrhythmia management .    Army Fossa, MD  09/18/2019 2:46 PM     [place] [address] [phone] (office) [phone] (fax)

## 2019-09-19 ENCOUNTER — Other Ambulatory Visit: Payer: Self-pay

## 2019-09-19 ENCOUNTER — Ambulatory Visit (INDEPENDENT_AMBULATORY_CARE_PROVIDER_SITE_OTHER): Payer: Managed Care, Other (non HMO) | Admitting: Sports Medicine

## 2019-09-19 ENCOUNTER — Ambulatory Visit (INDEPENDENT_AMBULATORY_CARE_PROVIDER_SITE_OTHER): Payer: Managed Care, Other (non HMO)

## 2019-09-19 ENCOUNTER — Other Ambulatory Visit: Payer: Self-pay | Admitting: Sports Medicine

## 2019-09-19 ENCOUNTER — Encounter: Payer: Self-pay | Admitting: Sports Medicine

## 2019-09-19 VITALS — BP 128/84 | HR 54 | Resp 16

## 2019-09-19 DIAGNOSIS — M79672 Pain in left foot: Secondary | ICD-10-CM

## 2019-09-19 DIAGNOSIS — M21621 Bunionette of right foot: Secondary | ICD-10-CM

## 2019-09-19 DIAGNOSIS — M722 Plantar fascial fibromatosis: Secondary | ICD-10-CM | POA: Diagnosis not present

## 2019-09-19 DIAGNOSIS — L84 Corns and callosities: Secondary | ICD-10-CM | POA: Diagnosis not present

## 2019-09-19 MED ORDER — TRIAMCINOLONE ACETONIDE 10 MG/ML IJ SUSP
10.0000 mg | Freq: Once | INTRAMUSCULAR | Status: AC
  Administered 2019-09-19: 10 mg

## 2019-09-19 NOTE — Progress Notes (Addendum)
Subjective: Darin Garcia is a 58 y.o. male patient presents to office with complaint of moderate heel pain on the left.  Patient admits to post static dyskinesia for 2 months in duration. Patient has treated this problem with rest ice over-the-counter orthotics with no relief.  Patient also complains of callus skin over the right fifth toe joint that he has been self trimming reports that sometimes the callus gets so painful and he feels like it rubs in his shoes.  Denies any other pedal complaints.   Review of Systems  All other systems reviewed and are negative.    Patient Active Problem List   Diagnosis Date Noted  . Typical atrial flutter (HCC) 08/15/2019  . Cardiomyopathy (HCC) 11/25/2018  . Elevated troponin 11/25/2018  . Atrial flutter (HCC) 11/23/2018  . Dyspnea on exertion 05/15/2017  . Upper airway cough syndrome vs cough variant asthma 05/15/2017  . DVT, recurrent, lower extremity, chronic, left (HCC) with PE 2014 05/14/2017    Current Outpatient Medications on File Prior to Visit  Medication Sig Dispense Refill  . rivaroxaban (XARELTO) 20 MG TABS tablet Take 1 tablet (20 mg total) by mouth daily. 30 tablet 3   No current facility-administered medications on file prior to visit.     No Known Allergies  Objective: Physical Exam General: The patient is alert and oriented x3 in no acute distress.  Dermatology: Skin is warm, dry and supple bilateral lower extremities. Nails 1-10 are normal. There is no erythema, edema, no eccymosis, callus dorsal fifth metatarsophalangeal joint on the right. Integument is otherwise unremarkable.  Vascular: Dorsalis Pedis pulse and Posterior Tibial pulse are 2/4 bilateral. Capillary fill time is immediate to all digits.  Neurological: Grossly intact to light touch with an achilles reflex of +2/5 and a  negative Tinel's sign bilateral.  Musculoskeletal: Tenderness to palpation at the medial calcaneal tubercale and through the insertion of  the plantar fascia on the left foot. No pain with compression of calcaneus bilateral. No pain with tuning fork to calcaneus bilateral.  Tailor's bunion deformity right greater than left.  No pain with calf compression bilateral. There is decreased Ankle joint range of motion bilateral. All other joints range of motion within normal limits bilateral.  Pes cavus foot type.  Strength 5/5 in all groups bilateral.   Gait: Unassisted, Antalgic avoid weight on left/right heel  Xray, Left foot:  Normal osseous mineralization. Joint spaces preserved.  Pes cavus foot type.  No fracture/dislocation/boney destruction. Calcaneal spur present with mild thickening of plantar fascia. No other soft tissue abnormalities or radiopaque foreign bodies.   Assessment and Plan: Problem List Items Addressed This Visit    None    Visit Diagnoses    Left foot pain    -  Primary   Relevant Orders   DG Foot Complete Left   Plantar fasciitis of left foot       Relevant Medications   triamcinolone acetonide (KENALOG) 10 MG/ML injection 10 mg (Completed) (Start on 09/19/2019  9:15 AM)   Tailor's bunion of right foot       Callus         -Complete examination performed.  -Xrays reviewed -Discussed with patient in detail the condition of plantar fasciitis and tailors bunion, how this occurs and general treatment options. Explained both conservative and surgical treatments.  -After oral consent and aseptic prep, injected a mixture containing 1 ml of 2%  plain lidocaine, 1 ml 0.5% plain marcaine, 0.5 ml of kenalog 10 and 0.5  ml of dexamethasone phosphate into left heel. Post-injection care discussed with patient.  -Recommended good supportive shoes and advised use of OTC insert. Explained to patient that if these orthoses work well, we will continue with these. If these do not improve his condition and  pain, we will consider custom molded orthoses. - Explained in detail the use of the fascial brace for the left which was  dispensed at today's visit. -Explained and dispensed to patient daily stretching exercises. -Recommend patient to ice affected area 1-2x daily. -And no additional charge mechanically debrided callus over the right fifth metatarsophalangeal joint using a sterile chisel blade without incident and dispensed tailor's bunion sleeve -Patient to return to office in 3-4 weeks for follow up or sooner if problems or questions arise.  Landis Martins, DPM

## 2019-09-19 NOTE — Patient Instructions (Signed)

## 2019-10-10 ENCOUNTER — Other Ambulatory Visit: Payer: Self-pay

## 2019-10-10 ENCOUNTER — Ambulatory Visit: Payer: Managed Care, Other (non HMO) | Admitting: Sports Medicine

## 2019-10-10 DIAGNOSIS — Z20822 Contact with and (suspected) exposure to covid-19: Secondary | ICD-10-CM

## 2019-10-11 ENCOUNTER — Encounter: Payer: Self-pay | Admitting: Primary Care

## 2019-10-11 ENCOUNTER — Telehealth (INDEPENDENT_AMBULATORY_CARE_PROVIDER_SITE_OTHER): Payer: Managed Care, Other (non HMO) | Admitting: Primary Care

## 2019-10-11 ENCOUNTER — Telehealth: Payer: Self-pay | Admitting: Internal Medicine

## 2019-10-11 DIAGNOSIS — U071 COVID-19: Secondary | ICD-10-CM

## 2019-10-11 LAB — NOVEL CORONAVIRUS, NAA: SARS-CoV-2, NAA: DETECTED — AB

## 2019-10-11 MED ORDER — ALBUTEROL SULFATE HFA 108 (90 BASE) MCG/ACT IN AERS: 2.0000 | INHALATION_SPRAY | Freq: Four times a day (QID) | RESPIRATORY_TRACT | 0 refills | Status: DC | PRN

## 2019-10-11 NOTE — Patient Instructions (Signed)
Rx: Albuterol rescue inhaler- take two puffs every 4-6 hours for shortness of breath/wheezing   Follow-up: Notify office if your symptoms worsen or you develop shortness of breath   COVID-19 COVID-19 is a respiratory infection that is caused by a virus called severe acute respiratory syndrome coronavirus 2 (SARS-CoV-2). The disease is also known as coronavirus disease or novel coronavirus. In some people, the virus may not cause any symptoms. In others, it may cause a serious infection. The infection can get worse quickly and can lead to complications, such as:  Pneumonia, or infection of the lungs.  Acute respiratory distress syndrome or ARDS. This is fluid build-up in the lungs.  Acute respiratory failure. This is a condition in which there is not enough oxygen passing from the lungs to the body.  Sepsis or septic shock. This is a serious bodily reaction to an infection.  Blood clotting problems.  Secondary infections due to bacteria or fungus. The virus that causes COVID-19 is contagious. This means that it can spread from person to person through droplets from coughs and sneezes (respiratory secretions). What are the causes? This illness is caused by a virus. You may catch the virus by:  Breathing in droplets from an infected person's cough or sneeze.  Touching something, like a table or a doorknob, that was exposed to the virus (contaminated) and then touching your mouth, nose, or eyes. What increases the risk? Risk for infection You are more likely to be infected with this virus if you:  Live in or travel to an area with a COVID-19 outbreak.  Come in contact with a sick person who recently traveled to an area with a COVID-19 outbreak.  Provide care for or live with a person who is infected with COVID-19. Risk for serious illness You are more likely to become seriously ill from the virus if you:  Are 28 years of age or older.  Have a long-term disease that lowers your  body's ability to fight infection (immunocompromised).  Live in a nursing home or long-term care facility.  Have a long-term (chronic) disease such as: ? Chronic lung disease, including chronic obstructive pulmonary disease or asthma ? Heart disease. ? Diabetes. ? Chronic kidney disease. ? Liver disease.  Are obese. What are the signs or symptoms? Symptoms of this condition can range from mild to severe. Symptoms may appear any time from 2 to 14 days after being exposed to the virus. They include:  A fever.  A cough.  Difficulty breathing.  Chills.  Muscle pains.  A sore throat.  Loss of taste or smell. Some people may also have stomach problems, such as nausea, vomiting, or diarrhea. Other people may not have any symptoms of COVID-19. How is this diagnosed? This condition may be diagnosed based on:  Your signs and symptoms, especially if: ? You live in an area with a COVID-19 outbreak. ? You recently traveled to or from an area where the virus is common. ? You provide care for or live with a person who was diagnosed with COVID-19.  A physical exam.  Lab tests, which may include: ? A nasal swab to take a sample of fluid from your nose. ? A throat swab to take a sample of fluid from your throat. ? A sample of mucus from your lungs (sputum). ? Blood tests.  Imaging tests, which may include, X-rays, CT scan, or ultrasound. How is this treated? At present, there is no medicine to treat COVID-19. Medicines that treat other diseases  are being used on a trial basis to see if they are effective against COVID-19. Your health care provider will talk with you about ways to treat your symptoms. For most people, the infection is mild and can be managed at home with rest, fluids, and over-the-counter medicines. Treatment for a serious infection usually takes places in a hospital intensive care unit (ICU). It may include one or more of the following treatments. These treatments are  given until your symptoms improve.  Receiving fluids and medicines through an IV.  Supplemental oxygen. Extra oxygen is given through a tube in the nose, a face mask, or a hood.  Positioning you to lie on your stomach (prone position). This makes it easier for oxygen to get into the lungs.  Continuous positive airway pressure (CPAP) or bi-level positive airway pressure (BPAP) machine. This treatment uses mild air pressure to keep the airways open. A tube that is connected to a motor delivers oxygen to the body.  Ventilator. This treatment moves air into and out of the lungs by using a tube that is placed in your windpipe.  Tracheostomy. This is a procedure to create a hole in the neck so that a breathing tube can be inserted.  Extracorporeal membrane oxygenation (ECMO). This procedure gives the lungs a chance to recover by taking over the functions of the heart and lungs. It supplies oxygen to the body and removes carbon dioxide. Follow these instructions at home: Lifestyle  If you are sick, stay home except to get medical care. Your health care provider will tell you how long to stay home. Call your health care provider before you go for medical care.  Rest at home as told by your health care provider.  Do not use any products that contain nicotine or tobacco, such as cigarettes, e-cigarettes, and chewing tobacco. If you need help quitting, ask your health care provider.  Return to your normal activities as told by your health care provider. Ask your health care provider what activities are safe for you. General instructions  Take over-the-counter and prescription medicines only as told by your health care provider.  Drink enough fluid to keep your urine pale yellow.  Keep all follow-up visits as told by your health care provider. This is important. How is this prevented?  There is no vaccine to help prevent COVID-19 infection. However, there are steps you can take to protect  yourself and others from this virus. To protect yourself:   Do not travel to areas where COVID-19 is a risk. The areas where COVID-19 is reported change often. To identify high-risk areas and travel restrictions, check the CDC travel website: FatFares.com.br  If you live in, or must travel to, an area where COVID-19 is a risk, take precautions to avoid infection. ? Stay away from people who are sick. ? Wash your hands often with soap and water for 20 seconds. If soap and water are not available, use an alcohol-based hand sanitizer. ? Avoid touching your mouth, face, eyes, or nose. ? Avoid going out in public, follow guidance from your state and local health authorities. ? If you must go out in public, wear a cloth face covering or face mask. ? Disinfect objects and surfaces that are frequently touched every day. This may include:  Counters and tables.  Doorknobs and light switches.  Sinks and faucets.  Electronics, such as phones, remote controls, keyboards, computers, and tablets. To protect others: If you have symptoms of COVID-19, take steps to prevent the  virus from spreading to others.  If you think you have a COVID-19 infection, contact your health care provider right away. Tell your health care team that you think you may have a COVID-19 infection.  Stay home. Leave your house only to seek medical care. Do not use public transport.  Do not travel while you are sick.  Wash your hands often with soap and water for 20 seconds. If soap and water are not available, use alcohol-based hand sanitizer.  Stay away from other members of your household. Let healthy household members care for children and pets, if possible. If you have to care for children or pets, wash your hands often and wear a mask. If possible, stay in your own room, separate from others. Use a different bathroom.  Make sure that all people in your household wash their hands well and often.  Cough or  sneeze into a tissue or your sleeve or elbow. Do not cough or sneeze into your hand or into the air.  Wear a cloth face covering or face mask. Where to find more information  Centers for Disease Control and Prevention: PurpleGadgets.be  World Health Organization: https://www.castaneda.info/ Contact a health care provider if:  You live in or have traveled to an area where COVID-19 is a risk and you have symptoms of the infection.  You have had contact with someone who has COVID-19 and you have symptoms of the infection. Get help right away if:  You have trouble breathing.  You have pain or pressure in your chest.  You have confusion.  You have bluish lips and fingernails.  You have difficulty waking from sleep.  You have symptoms that get worse. These symptoms may represent a serious problem that is an emergency. Do not wait to see if the symptoms will go away. Get medical help right away. Call your local emergency services (911 in the [place] Do not drive yourself to the hospital. Let the emergency medical personnel know if you think you have COVID-19. Summary  COVID-19 is a respiratory infection that is caused by a virus. It is also known as coronavirus disease or novel coronavirus. It can cause serious infections, such as pneumonia, acute respiratory distress syndrome, acute respiratory failure, or sepsis.  The virus that causes COVID-19 is contagious. This means that it can spread from person to person through droplets from coughs and sneezes.  You are more likely to develop a serious illness if you are 71 years of age or older, have a weak immunity, live in a nursing home, or have chronic disease.  There is no medicine to treat COVID-19. Your health care provider will talk with you about ways to treat your symptoms.  Take steps to protect yourself and others from infection. Wash your hands often and disinfect objects and surfaces that are  frequently touched every day. Stay away from people who are sick and wear a mask if you are sick. This information is not intended to replace advice given to you by your health care provider. Make sure you discuss any questions you have with your health care provider. Document Released: 12/15/2018 Document Revised: 04/06/2019 Document Reviewed: 12/15/2018 Elsevier Patient Education  2020 [employer].

## 2019-10-11 NOTE — Telephone Encounter (Signed)
Patient set with 10:00 video visit with Derl Barrow, NP. Nothing further needed.

## 2019-10-11 NOTE — Telephone Encounter (Signed)
Called the patient and advised we would like a video or mychart visit since he has not been seen in clinic since 04/04/18 by Dr. Melvyn Novas. I told him we did not have an inhaler listed for him, he stated it must have been issued by his PCP.   The patient stated he started having a burning sensation in his chest. And went to get tested. Confirmed he is on Xarelto 10 mg not the 20 mg.

## 2019-10-11 NOTE — Progress Notes (Signed)
Virtual Visit via Video Note  I connected with Darin Garcia on 10/11/19 at 10:00 AM EST by a video enabled telemedicine application and verified that I am speaking with the correct person using two identifiers.  Location: Patient: Home Provider: Office   I discussed the limitations of evaluation and management by telemedicine and the availability of in person appointments. The patient expressed understanding and agreed to proceed.  History of Present Illness: 58 year old male, former smoker. PMH significant for atrial flutter, cardiomyopathy, recurrent DVT, upper airway cough syndrome vs cough variant asthma. Patient of Dr. Melvyn Novas, last seen on 04/04/18.   10/11/2019 Patient contacted today for acute video visit. He tested positive for COVID on Monday 10/09/19. Reports chills, body aches, productive cough and burning in chest. He belives he contracted illness from work. He is currently isolating in hotel room. He denies shortness of breath. Requesting refill of Albuterol rescue inhaler to have on hand.       Observations/Objective:  - Appears well, able to speak in full sentences - No observed shortness of breath, wheezing or cough  Assessment and Plan:  COVID-19 - Tested positive on 10/09/19 - Reports flu like symptoms. No shortness of breath.  - RX albuterol hfa two puffs every 4-6 hours prn shortness of breath/wheezing  - Recommend mucinex 600mg  twice daily for chest congestion - Encouraged rest, oral hydration, tylenol for fever/chills, strict hand hygiene and masking  Follow Up Instructions:   - Notify office if your symptoms worsen or you develop shortness of breath   I discussed the assessment and treatment plan with the patient. The patient was provided an opportunity to ask questions and all were answered. The patient agreed with the plan and demonstrated an understanding of the instructions.   The patient was advised to call back or seek an in-person evaluation if the  symptoms worsen or if the condition fails to improve as anticipated.  I provided 15 minutes of non-face-to-face time during this encounter.   Martyn Ehrich, NP

## 2019-10-12 ENCOUNTER — Encounter (HOSPITAL_COMMUNITY): Payer: Self-pay | Admitting: Internal Medicine

## 2019-10-17 NOTE — Progress Notes (Signed)
Chart and office note reviewed in detail  > agree with a/p as outlined    

## 2019-10-31 ENCOUNTER — Other Ambulatory Visit: Payer: Self-pay | Admitting: Primary Care

## 2019-12-28 ENCOUNTER — Other Ambulatory Visit: Payer: Self-pay

## 2019-12-28 ENCOUNTER — Ambulatory Visit (HOSPITAL_COMMUNITY): Payer: Managed Care, Other (non HMO) | Attending: Cardiology

## 2019-12-28 DIAGNOSIS — I429 Cardiomyopathy, unspecified: Secondary | ICD-10-CM

## 2020-01-04 NOTE — Progress Notes (Deleted)
PCP:  Loraine Leriche., MD Primary Cardiologist: Candee Furbish, MD Electrophysiologist: Dr. Michail Sermon Blaker is a 59 y.o. male with past medical history of atrial flutter s/p ablation, PTE/DVT, and cardiomyopathy who presents today for routine electrophysiology followup. They are seen for Dr. Rayann Heman.   Since last being seen in our clinic, the patient reports doing very well.    Echo 12/28/2019 shows EF improved from 45% range to normal at 60-65%.   The patient feels that he is tolerating medications without difficulties and is otherwise without complaint today.   Past Medical History:  Diagnosis Date  . Atrial flutter ([place])    a. s/p TEE DCCV 11/2018.  [name] Cardiomyopathy ([place])   . Dyspnea   . GERD (gastroesophageal reflux disease)   . History of DVT (deep vein thrombosis)   . Pulmonary embolism [facility])    Past Surgical History:  Procedure Laterality Date  . A-FLUTTER ABLATION N/A 04/04/2019   Procedure: A-FLUTTER ABLATION;  Surgeon: Thompson Grayer, MD;  Location: [facility] CV LAB;  Service: Cardiovascular;  Laterality: N/A;  . A-FLUTTER ABLATION N/A 08/15/2019   Procedure: A-FLUTTER ABLATION;  Surgeon: Thompson Grayer, MD;  Location: [place] CV LAB;  Service: Cardiovascular;  Laterality: N/A;  . ATRIAL FLUTTER ABLATION  08/15/2019  . CARDIOVERSION N/A 11/25/2018   Procedure: CARDIOVERSION;  Surgeon: Lelon Perla, MD;  Location: [facility] ENDOSCOPY;  Service: Cardiovascular;  Laterality: N/A;  . TEE WITHOUT CARDIOVERSION N/A 11/25/2018   Procedure: TRANSESOPHAGEAL ECHOCARDIOGRAM (TEE);  Surgeon: Lelon Perla, MD;  Location: [facility] ENDOSCOPY;  Service: Cardiovascular;  Laterality: N/A;  . TEE WITHOUT CARDIOVERSION  08/15/2019  . TEE WITHOUT CARDIOVERSION N/A 08/15/2019   Procedure: TRANSESOPHAGEAL ECHOCARDIOGRAM (TEE);  Surgeon: Jerline Pain, MD;  Location: [facility] ENDOSCOPY;  Service: Cardiovascular;  Laterality: N/A;    Current Outpatient Medications  Medication Sig Dispense Refill   . albuterol (VENTOLIN HFA) 108 (90 Base) MCG/ACT inhaler Inhale 2 puffs into the lungs every 6 (six) hours as needed for wheezing or shortness of breath. 8 g 0  . rivaroxaban (XARELTO) 10 MG TABS tablet Take 10 mg by mouth daily.     No current facility-administered medications for this visit.    No Known Allergies  Social History   Socioeconomic History  . Marital status: Married    Spouse name: michelle  . Number of children: Not on file  . Years of education: Not on file  . Highest education level: Not on file  Occupational History    Employer: [employer]  Tobacco Use  . Smoking status: Former Smoker    Packs/day: 1.00    Years: 4.00    Pack years: 4.00    Types: Cigarettes    Quit date: 11/23/1993    Years since quitting: 26.1  . Smokeless tobacco: Never Used  Substance and Sexual Activity  . Alcohol use: Yes    Comment: occ  . Drug use: Never  . Sexual activity: Not on file  Other Topics Concern  . Not on file  Social History Narrative   He is originally from [place].  He moved to [place] 3 years ago.  He is married.  He has 2 children (23 and 18).  He works in Press photographer for [employer].   Social Determinants of Health   Financial Resource Strain:   . Difficulty of Paying Living Expenses: Not on file  Food Insecurity:   . Worried About Charity fundraiser in the Last Year: Not on file  .  Ran Out of Food in the Last Year: Not on file  Transportation Needs:   . Lack of Transportation (Medical): Not on file  . Lack of Transportation (Non-Medical): Not on file  Physical Activity:   . Days of Exercise per Week: Not on file  . Minutes of Exercise per Session: Not on file  Stress:   . Feeling of Stress : Not on file  Social Connections:   . Frequency of Communication with Friends and Family: Not on file  . Frequency of Social Gatherings with Friends and Family: Not on file  . Attends Religious Services: Not on file  . Active Member of Clubs or Organizations: Not  on file  . Attends Banker Meetings: Not on file  . Marital Status: Not on file  Intimate Partner Violence:   . Fear of Current or Ex-Partner: Not on file  . Emotionally Abused: Not on file  . Physically Abused: Not on file  . Sexually Abused: Not on file     Review of Systems: General: No chills, fever, night sweats or weight changes  Cardiovascular:  No chest pain, dyspnea on exertion, edema, orthopnea, palpitations, paroxysmal nocturnal dyspnea Dermatological: No rash, lesions or masses Respiratory: No cough, dyspnea Urologic: No hematuria, dysuria Abdominal: No nausea, vomiting, diarrhea, bright red blood per rectum, melena, or hematemesis Neurologic: No visual changes, weakness, changes in mental status All other systems reviewed and are otherwise negative except as noted above.  Physical Exam: There were no vitals filed for this visit.  GEN- The patient is well appearing, alert and oriented x 3 today.   HEENT: normocephalic, atraumatic; sclera clear, conjunctiva pink; hearing intact; oropharynx clear; neck supple, no JVP Lymph- no cervical lymphadenopathy Lungs- Clear to ausculation bilaterally, normal work of breathing.  No wheezes, rales, rhonchi Heart- Regular rate and rhythm, no murmurs, rubs or gallops, PMI not laterally displaced GI- soft, non-tender, non-distended, bowel sounds present, no hepatosplenomegaly Extremities- no clubbing, cyanosis, or edema; DP/PT/radial pulses 2+ bilaterally MS- no significant deformity or atrophy Skin- warm and dry, no rash or lesion Psych- euthymic mood, full affect Neuro- strength and sensation are intact  EKG {ACTION; IS/IS [id number]} ordered. Personal review of EKG from {Blank single:19197::"today","***"} shows ***  Assessment and Plan:  1. Atrial flutter Stable s/p ablation  2. PTE/DVT Continue Xarelto 10 mg daily  3. Cardiomyopathy EF has improved from 40-45% to 60%. Suspect was tachy  mediated    Graciella Freer, PA-C  01/04/20 10:30 AM

## 2020-01-08 ENCOUNTER — Ambulatory Visit: Payer: Managed Care, Other (non HMO) | Admitting: Student

## 2020-01-24 ENCOUNTER — Ambulatory Visit: Payer: Managed Care, Other (non HMO) | Admitting: Student

## 2020-02-02 NOTE — Progress Notes (Signed)
PCP:  Cheron Schaumann., MD Primary Cardiologist: Donato Schultz, MD Electrophysiologist: Hillis Range, MD  Darin Garcia is a 59 y.o. male with past medical history of AFL s/p ablation, PTE/DVT, and CMP who presents today for routine electrophysiology followup. They are seen for Dr. Johney Frame.   Since last being seen in our clinic, the patient reports doing very well.  He had an echo last month that showed normalization of his EF from 40-45% to 60-65%, suspect tachy-mediated.    He is feeling great overall. He denies symptoms of palpitations, chest pain, shortness of breath, orthopnea, PND, claudication,  presyncope, syncope, bleeding, or neurologic sequela. He would get mild dizziness when going up stairs, especially prior to his ablation, but hasn't noticed this lately. The patient is tolerating medications without difficulties.  His HR tends to stay in the 60s at rest per his [employer] at home. He gets mild left ankle edema at times, which he relates to an old injury.    Past Medical History:  Diagnosis Date  . Atrial flutter ([facility])    a. s/p TEE DCCV 11/2018.  [name] Cardiomyopathy ([facility])   . Dyspnea   . GERD (gastroesophageal reflux disease)   . History of DVT (deep vein thrombosis)   . Pulmonary embolism [facility])    Past Surgical History:  Procedure Laterality Date  . A-FLUTTER ABLATION N/A 04/04/2019   Procedure: A-FLUTTER ABLATION;  Surgeon: Hillis Range, MD;  [facility] INVASIVE CV LAB;  Service: Cardiovascular;  Laterality: N/A;  . A-FLUTTER ABLATION N/A 08/15/2019   Procedure: A-FLUTTER ABLATION;  Surgeon: Hillis Range, MD;  Location: [facility] INVASIVE CV LAB;  Service: Cardiovascular;  Laterality: N/A;  . ATRIAL FLUTTER ABLATION  08/15/2019  . CARDIOVERSION N/A 11/25/2018   Procedure: CARDIOVERSION;  Surgeon: Lewayne Bunting, MD;  Location: [facility] ENDOSCOPY;  Service: Cardiovascular;  Laterality: N/A;  . TEE WITHOUT CARDIOVERSION N/A 11/25/2018   Procedure: TRANSESOPHAGEAL ECHOCARDIOGRAM (TEE);   Surgeon: Lewayne Bunting, MD;  Location: [facility] ENDOSCOPY;  Service: Cardiovascular;  Laterality: N/A;  . TEE WITHOUT CARDIOVERSION  08/15/2019  . TEE WITHOUT CARDIOVERSION N/A 08/15/2019   Procedure: TRANSESOPHAGEAL ECHOCARDIOGRAM (TEE);  Surgeon: Jake Bathe, MD;  Location: [facility] ENDOSCOPY;  Service: Cardiovascular;  Laterality: N/A;    Current Outpatient Medications  Medication Sig Dispense Refill  . albuterol (VENTOLIN HFA) 108 (90 Base) MCG/ACT inhaler Inhale 2 puffs into the lungs every 6 (six) hours as needed for wheezing or shortness of breath. 8 g 0  . rivaroxaban (XARELTO) 10 MG TABS tablet Take 10 mg by mouth daily.     No current facility-administered medications for this visit.    No Known Allergies  Social History   Socioeconomic History  . Marital status: Married    Spouse name: michelle  . Number of children: Not on file  . Years of education: Not on file  . Highest education level: Not on file  Occupational History    Employer: [employer]  Tobacco Use  . Smoking status: Former Smoker    Packs/day: 1.00    Years: 4.00    Pack years: 4.00    Types: Cigarettes    Quit date: 11/23/1993    Years since quitting: 26.2  . Smokeless tobacco: Never Used  Substance and Sexual Activity  . Alcohol use: Yes    Comment: occ  . Drug use: Never  . Sexual activity: Not on file  Other Topics Concern  . Not on file  Social History Narrative   He is originally  from [place].  He moved to [place] 3 years ago.  He is married.  He has 2 children (23 and 18).  He works in Press photographer for [employer].   Social Determinants of Health   Financial Resource Strain:   . Difficulty of Paying Living Expenses:   Food Insecurity:   . Worried About Charity fundraiser in the Last Year:   . Arboriculturist in the Last Year:   Transportation Needs:   . Film/video editor (Medical):   [name] Lack of Transportation (Non-Medical):   Physical Activity:   . Days of Exercise per Week:   .  Minutes of Exercise per Session:   Stress:   . Feeling of Stress :   Social Connections:   . Frequency of Communication with Friends and Family:   . Frequency of Social Gatherings with Friends and Family:   . Attends Religious Services:   . Active Member of Clubs or Organizations:   . Attends Archivist Meetings:   [name] Marital Status:   Intimate Partner Violence:   . Fear of Current or Ex-Partner:   . Emotionally Abused:   [name] Physically Abused:   . Sexually Abused:      Review of Systems: General: No chills, fever, night sweats or weight changes  Cardiovascular:  No chest pain, dyspnea on exertion, edema, orthopnea, palpitations, paroxysmal nocturnal dyspnea Dermatological: No rash, lesions or masses Respiratory: No cough, dyspnea Urologic: No hematuria, dysuria Abdominal: No nausea, vomiting, diarrhea, bright red blood per rectum, melena, or hematemesis Neurologic: No visual changes, weakness, changes in mental status All other systems reviewed and are otherwise negative except as noted above.  Physical Exam: Vitals:   02/05/20 0751  SpO2: 99%  Weight: 261 lb 6.4 oz (118.6 kg)  Height: 6\' 3"  (1.905 m)    GEN- The patient is well appearing, alert and oriented x 3 today.   HEENT: normocephalic, atraumatic; sclera clear, conjunctiva pink; hearing intact; oropharynx clear; neck supple, no JVP Lymph- no cervical lymphadenopathy Lungs- Clear to ausculation bilaterally, normal work of breathing.  No wheezes, rales, rhonchi Heart- Slightly brady rate and rhythm, no murmurs, rubs or gallops, PMI not laterally displaced GI- soft, non-tender, non-distended, bowel sounds present, no hepatosplenomegaly Extremities- no clubbing, cyanosis, or edema; DP/PT/radial pulses 2+ bilaterally MS- no significant deformity or atrophy Skin- warm and dry, no rash or lesion Psych- euthymic mood, full affect Neuro- strength and sensation are intact  EKG is ordered. Personal review of EKG  from today shows Sinus bradycardia at 49 bpm with normal intervals  Assessment and Plan:  1. Atrial flutter He is doing well post ablation 07/2019 He is on Xarelto 10 mg daily for DVT as below BMET today  2. PTE/DVT Continue Xarelto 10 mg daily. Refill CBC today.   3. 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   RTC 6 months with Dr. Thomasene Mohair, PA-C  02/05/20 8:06 AM

## 2020-02-05 ENCOUNTER — Other Ambulatory Visit: Payer: Self-pay

## 2020-02-05 ENCOUNTER — Encounter: Payer: Self-pay | Admitting: Student

## 2020-02-05 ENCOUNTER — Ambulatory Visit (INDEPENDENT_AMBULATORY_CARE_PROVIDER_SITE_OTHER): Payer: Managed Care, Other (non HMO) | Admitting: Student

## 2020-02-05 VITALS — BP 122/60 | HR 49 | Ht 75.0 in | Wt 261.4 lb

## 2020-02-05 DIAGNOSIS — Z79899 Other long term (current) drug therapy: Secondary | ICD-10-CM | POA: Diagnosis not present

## 2020-02-05 DIAGNOSIS — I4892 Unspecified atrial flutter: Secondary | ICD-10-CM

## 2020-02-05 DIAGNOSIS — I429 Cardiomyopathy, unspecified: Secondary | ICD-10-CM

## 2020-02-05 LAB — BASIC METABOLIC PANEL
BUN/Creatinine Ratio: 7 — ABNORMAL LOW (ref 9–20)
BUN: 7 mg/dL (ref 6–24)
CO2: 21 mmol/L (ref 20–29)
Calcium: 8.7 mg/dL (ref 8.7–10.2)
Chloride: 103 mmol/L (ref 96–106)
Creatinine, Ser: 1.03 mg/dL (ref 0.76–1.27)
GFR calc Af Amer: 93 mL/min/{1.73_m2} (ref >59)
GFR calc non Af Amer: 80 mL/min/{1.73_m2} (ref >59)
Glucose: 87 mg/dL (ref 65–99)
Potassium: 4.3 mmol/L (ref 3.5–5.2)
Sodium: 139 mmol/L (ref 134–144)

## 2020-02-05 LAB — CBC
Hematocrit: 47 % (ref 37.5–51.0)
Hemoglobin: 16 g/dL (ref 13.0–17.7)
MCH: 30.5 pg (ref 26.6–33.0)
MCHC: 34 g/dL (ref 31.5–35.7)
MCV: 90 fL (ref 79–97)
Platelets: 279 10*3/uL (ref 150–450)
RBC: 5.25 x10E6/uL (ref 4.14–5.80)
RDW: 12.4 % (ref 11.6–15.4)
WBC: 4.7 10*3/uL (ref 3.4–10.8)

## 2020-02-05 MED ORDER — RIVAROXABAN 10 MG PO TABS: 10.0000 mg | ORAL_TABLET | Freq: Every day | ORAL | 3 refills | Status: DC

## 2020-02-05 NOTE — Patient Instructions (Addendum)
Medication Instructions:  none *If you need a refill on your cardiac medications before your next appointment, please call your pharmacy*   Lab Work: TODAY BMET CBC If you have labs (blood work) drawn today and your tests are completely normal, you will receive your results only by: [name] MyChart Message (if you have MyChart) OR . A paper copy in the mail If you have any lab test that is abnormal or we need to change your treatment, we will call you to review the results.   Testing/Procedures: none   Follow-Up: At [facility], you and your health needs are our priority.  As part of our continuing mission to provide you with exceptional heart care, we have created designated Provider Care Teams.  These Care Teams include your primary Cardiologist (physician) and Advanced Practice Providers (APPs -  Physician Assistants and Nurse Practitioners) who all work together to provide you with the care you need, when you need it.  We recommend signing up for the patient portal called "MyChart".  Sign up information is provided on this After Visit Summary.  MyChart is used to connect with patients for Virtual Visits (Telemedicine).  Patients are able to view lab/test results, encounter notes, upcoming appointments, etc.  Non-urgent messages can be sent to your provider as well.   To learn more about what you can do with MyChart, go to [email or web address].    Your next appointment:   6 months  The format for your next appointment:   Either In Person or Virtual  Provider:   Dr Johney Frame   Other Instructions

## 2020-03-19 ENCOUNTER — Encounter: Payer: Self-pay | Admitting: Sports Medicine

## 2020-03-19 ENCOUNTER — Ambulatory Visit (INDEPENDENT_AMBULATORY_CARE_PROVIDER_SITE_OTHER): Payer: Managed Care, Other (non HMO) | Admitting: Sports Medicine

## 2020-03-19 ENCOUNTER — Other Ambulatory Visit: Payer: Self-pay

## 2020-03-19 DIAGNOSIS — M722 Plantar fascial fibromatosis: Secondary | ICD-10-CM

## 2020-03-19 DIAGNOSIS — M79672 Pain in left foot: Secondary | ICD-10-CM

## 2020-03-19 MED ORDER — TRIAMCINOLONE ACETONIDE 10 MG/ML IJ SUSP
10.0000 mg | Freq: Once | INTRAMUSCULAR | Status: AC
  Administered 2020-03-19: 10 mg

## 2020-03-19 NOTE — Progress Notes (Signed)
Subjective: Darin Garcia is a 59 y.o. male patient presents to office with complaint of moderate heel pain on the left that has flared back up. Previous shot helped a lot but did get COVID in Nov and could not come to office + travelling for work to [place], reports that after work and a lot of walking for 2 days has pain. Patient has treated this problem with stretcing with no relief. Denies any other pedal complaints.   Patient Active Problem List   Diagnosis Date Noted  . Typical atrial flutter (H[facility]) 08/15/2019  . Cardiomyopathy ([facility]) 11/25/2018  . Elevated troponin 11/25/2018  . Atrial flutter ([facility]) 11/23/2018  . Dyspnea on exertion 05/15/2017  . Upper airway cough syndrome vs cough variant asthma 05/15/2017  . DVT, recurrent, lower extremity, chronic, left (HCC) with PE 2014 05/14/2017    Current Outpatient Medications on File Prior to Visit  Medication Sig Dispense Refill  . albuterol (VENTOLIN HFA) 108 (90 Base) MCG/ACT inhaler Inhale 2 puffs into the lungs every 6 (six) hours as needed for wheezing or shortness of breath. 8 g 0  . rivaroxaban (XARELTO) 10 MG TABS tablet Take 1 tablet (10 mg total) by mouth daily. 90 tablet 3   No current facility-administered medications on file prior to visit.    No Known Allergies  Objective: Physical Exam General: The patient is alert and oriented x3 in no acute distress.  Dermatology: Skin is warm, dry and supple bilateral lower extremities. Nails 1-10 are normal. There is no erythema, edema, no eccymosis, no open lesions present. Integument is otherwise unremarkable.  Vascular: Dorsalis Pedis pulse and Posterior Tibial pulse are 2/4 bilateral. Capillary fill time is immediate to all digits.  Neurological: Grossly intact to light touch with an achilles reflex of +2/5 and a  negative Tinel's sign bilateral.  Musculoskeletal: Tenderness to palpation at the medial calcaneal tubercale and through the insertion of the plantar fascia on the left  foot. No pain with compression of calcaneus bilateral. No pain with tuning fork to calcaneus bilateral. No pain with calf compression bilateral. There is decreased Ankle joint range of motion bilateral. All other joints range of motion within normal limits bilateral. Strength 5/5 in all groups bilateral.   Assessment and Plan: Problem List Items Addressed This Visit    None    Visit Diagnoses    Plantar fasciitis of left foot    -  Primary   Relevant Medications   triamcinolone acetonide (KENALOG) 10 MG/ML injection 10 mg (Completed) (Start on 03/19/2020 10:15 PM)   Left foot pain          -Complete examination performed.  -Discussed with patient in detail the condition of plantar fasciitis, flare, how this occurs and general treatment options. Explained both conservative and surgical treatments.  -After oral consent and aseptic prep, injected a mixture containing 1 ml of 2%  plain lidocaine, 1 ml 0.5% plain marcaine, 0.5 ml of kenalog 10 and 0.5 ml of dexamethasone phosphate into left heel. Post-injection care discussed with patient.  -Recommended good supportive shoes and heel lift and advised use of and to consider custom molded orthoses of which patient wants to get later once he relocates to [place]. - Explained in detail the use of the fascial brace for left which was dispensed at today's visit. -Explained and dispensed to patient daily stretching exercises. -Recommend patient to ice affected area 1-2x daily. -Patient to return to office as needed or sooner if problems or questions arise.  Asencion Islam,  DPM

## 2020-08-30 ENCOUNTER — Encounter: Payer: Self-pay | Admitting: Internal Medicine

## 2020-08-30 ENCOUNTER — Other Ambulatory Visit: Payer: Self-pay

## 2020-08-30 ENCOUNTER — Ambulatory Visit (INDEPENDENT_AMBULATORY_CARE_PROVIDER_SITE_OTHER): Payer: Managed Care, Other (non HMO) | Admitting: Internal Medicine

## 2020-08-30 VITALS — BP 132/74 | HR 57 | Ht 75.0 in | Wt 264.0 lb

## 2020-08-30 DIAGNOSIS — I4892 Unspecified atrial flutter: Secondary | ICD-10-CM | POA: Diagnosis not present

## 2020-08-30 DIAGNOSIS — I429 Cardiomyopathy, unspecified: Secondary | ICD-10-CM | POA: Diagnosis not present

## 2020-08-30 DIAGNOSIS — Z86711 Personal history of pulmonary embolism: Secondary | ICD-10-CM | POA: Diagnosis not present

## 2020-08-30 MED ORDER — RIVAROXABAN 10 MG PO TABS: 10.0000 mg | ORAL_TABLET | Freq: Every day | ORAL | 3 refills | Status: DC

## 2020-08-30 NOTE — Patient Instructions (Addendum)
Medication Instructions:  Your physician recommends that you continue on your current medications as directed. Please refer to the Current Medication list given to you today.  *If you need a refill on your cardiac medications before your next appointment, please call your pharmacy*  Lab Work: None ordered.  If you have labs (blood work) drawn today and your tests are completely normal, you will receive your results only by: . MyChart Message (if you have MyChart) OR . A paper copy in the mail If you have any lab test that is abnormal or we need to change your treatment, we will call you to review the results.  Testing/Procedures: None ordered.  Follow-Up: At [facility], you and your health needs are our priority.  As part of our continuing mission to provide you with exceptional heart care, we have created designated Provider Care Teams.  These Care Teams include your primary Cardiologist (physician) and Advanced Practice Providers (APPs -  Physician Assistants and Nurse Practitioners) who all work together to provide you with the care you need, when you need it.  We recommend signing up for the patient portal called "MyChart".  Sign up information is provided on this After Visit Summary.  MyChart is used to connect with patients for Virtual Visits (Telemedicine).  Patients are able to view lab/test results, encounter notes, upcoming appointments, etc.  Non-urgent messages can be sent to your provider as well.   To learn more about what you can do with MyChart, go to [email or web address].    Your next appointment:   Your physician wants you to follow-up in: 6 months with Andy Tillery.  You will receive a reminder letter in the mail two months in advance. If you don't receive a letter, please call our office to schedule the follow-up appointment.    Other Instructions:  

## 2020-08-30 NOTE — Progress Notes (Signed)
   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 male who presents today for routine electrophysiology followup.  Since last being seen in our clinic, the patient reports doing very well.  Today, he denies symptoms of palpitations, chest pain, shortness of breath,  lower extremity edema, dizziness, presyncope, or syncope.  The patient is otherwise without complaint today.   Past Medical History:  Diagnosis Date  . Atrial flutter ([facility])    a. s/p TEE DCCV 11/2018.  [name] Cardiomyopathy ([facility])   . Dyspnea   . GERD (gastroesophageal reflux disease)   . History of DVT (deep vein thrombosis)   . Pulmonary embolism [facility])    Past Surgical History:  Procedure Laterality Date  . A-FLUTTER ABLATION N/A 04/04/2019   Procedure: A-FLUTTER ABLATION;  Surgeon: Hillis Range, MD;  Location: [facility] INVASIVE CV LAB;  Service: Cardiovascular;  Laterality: N/A;  . A-FLUTTER ABLATION N/A 08/15/2019   Procedure: A-FLUTTER ABLATION;  Surgeon: Hillis Range, MD;  Location: [facility] INVASIVE CV LAB;  Service: Cardiovascular;  Laterality: N/A;  . ATRIAL FLUTTER ABLATION  08/15/2019  . CARDIOVERSION N/A 11/25/2018   Procedure: CARDIOVERSION;  Surgeon: Lewayne Bunting, MD;  Location[facility] ENDOSCOPY;  Service: Cardiovascular;  Laterality: N/A;  . TEE WITHOUT CARDIOVERSION N/A 11/25/2018   Procedure: TRANSESOPHAGEAL ECHOCARDIOGRAM (TEE);  Surgeon: Lewayne Bunting, MD;  Location: [facility] ENDOSCOPY;  Service: Cardiovascular;  Laterality: N/A;  . TEE WITHOUT CARDIOVERSION  08/15/2019  . TEE WITHOUT CARDIOVERSION N/A 08/15/2019   Procedure: TRANSESOPHAGEAL ECHOCARDIOGRAM (TEE);  Surgeon: Jake Bathe, MD;  Location: [facility] ENDOSCOPY;  Service: Cardiovascular;  Laterality: N/A;    ROS- all systems are reviewed and negatives except as per HPI above  Current Outpatient Medications  Medication Sig Dispense Refill  . albuterol (VENTOLIN HFA) 108 (90 Base) MCG/ACT inhaler Inhale 2 puffs into the lungs every 6  (six) hours as needed for wheezing or shortness of breath. 8 g 0  . rivaroxaban (XARELTO) 10 MG TABS tablet Take 1 tablet (10 mg total) by mouth daily. 90 tablet 3   No current facility-administered medications for this visit.    Physical Exam: Vitals:   08/30/20 0843  BP: 132/74  Pulse: (!) 57  SpO2: 96%  Weight: 264 lb (119.7 kg)  Height: 6\' 3"  (1.905 m)    GEN- The patient is well appearing, alert and oriented x 3 today.   Head- normocephalic, atraumatic Eyes-  Sclera clear, conjunctiva pink Ears- hearing intact Oropharynx- clear Lungs- Clear to ausculation bilaterally, normal work of breathing Heart- Regular rate and rhythm, no murmurs, rubs or gallops, PMI not laterally displaced GI- soft, NT, ND, + BS Extremities- no clubbing, cyanosis, or edema  Wt Readings from Last 3 Encounters:  08/30/20 264 lb (119.7 kg)  02/05/20 261 lb 6.4 oz (118.6 kg)  09/18/19 240 lb (108.9 kg)    EKG tracing ordered today is personally reviewed and shows sinus with PVCs  Assessment and Plan:  1. Atrial flutter Resolved post ablation  2. Nonischemic CM (tachycardia mediated EF is now normal by echo 12/28/19  3. Prior PTE/DVT Continue xarelto 10mg  daily long term  Risks, benefits and potential toxicities for medications prescribed and/or refilled reviewed with patient today.   Return to see EP PA in 6 months I will see as needed going forward  02/25/20 MD, [facility] 08/30/2020 9:12 AM

## 2021-04-27 ENCOUNTER — Emergency Department (HOSPITAL_COMMUNITY): Payer: Managed Care, Other (non HMO)

## 2021-04-27 ENCOUNTER — Emergency Department (HOSPITAL_COMMUNITY)
Admission: EM | Admit: 2021-04-27 | Discharge: 2021-04-27 | Disposition: A | Discharge status: Home / Self Care | Payer: Managed Care, Other (non HMO) | Attending: Emergency Medicine | Admitting: Emergency Medicine

## 2021-04-27 ENCOUNTER — Other Ambulatory Visit: Payer: Self-pay

## 2021-04-27 ENCOUNTER — Encounter (HOSPITAL_COMMUNITY): Payer: Self-pay | Admitting: Emergency Medicine

## 2021-04-27 ENCOUNTER — Telehealth: Payer: Self-pay | Admitting: Student

## 2021-04-27 DIAGNOSIS — I4891 Unspecified atrial fibrillation: Secondary | ICD-10-CM | POA: Insufficient documentation

## 2021-04-27 DIAGNOSIS — Z7901 Long term (current) use of anticoagulants: Secondary | ICD-10-CM | POA: Diagnosis not present

## 2021-04-27 DIAGNOSIS — Z87891 Personal history of nicotine dependence: Secondary | ICD-10-CM | POA: Insufficient documentation

## 2021-04-27 DIAGNOSIS — R002 Palpitations: Secondary | ICD-10-CM | POA: Diagnosis present

## 2021-04-27 LAB — CBC
HCT: 47.3 % (ref 39.0–52.0)
Hemoglobin: 16.2 g/dL (ref 13.0–17.0)
MCH: 31 pg (ref 26.0–34.0)
MCHC: 34.2 g/dL (ref 30.0–36.0)
MCV: 90.4 fL (ref 80.0–100.0)
Platelets: 295 10*3/uL (ref 150–400)
RBC: 5.23 MIL/uL (ref 4.22–5.81)
RDW: 11.9 % (ref 11.5–15.5)
WBC: 9.5 10*3/uL (ref 4.0–10.5)
nRBC: 0 % (ref 0.0–0.2)

## 2021-04-27 LAB — BASIC METABOLIC PANEL
Anion gap: 9 (ref 5–15)
BUN: 11 mg/dL (ref 6–20)
CO2: 25 mmol/L (ref 22–32)
Calcium: 9.4 mg/dL (ref 8.9–10.3)
Chloride: 103 mmol/L (ref 98–111)
Creatinine, Ser: 1.24 mg/dL (ref 0.61–1.24)
GFR, Estimated: >60 mL/min (ref >60)
Glucose, Bld: 90 mg/dL (ref 70–99)
Potassium: 4.3 mmol/L (ref 3.5–5.1)
Sodium: 137 mmol/L (ref 135–145)

## 2021-04-27 NOTE — Telephone Encounter (Signed)
    The patient called the after-hours line reporting new-onset dizziness, dyspnea and chest pressure which started this morning. He does have a Kardia device and says his HR has been elevated in the 130's to 150's and his monitor is reporting atrial flutter. Feels like his prior atrial flutter and he is s/p prior ablations in 03/2019 and 07/2019. Given his symptoms and history of tachycardia-mediated cardiomyopathy, I did recommend ED evaluation. He plans to have family bring him to [facility] ED. Given his onset of symptoms this morning, he might be a candidate for DCCV while in the ED and follow-up with the Atrial Fibrillation [facility] afterwards. He will need dose adjustment of Xarelto as he is currently on 10mg  daily due to prior DVT and this would need to be titrated to 20mg  daily.   I did call ED triage to make them aware of his upcoming arrival.   , PA-C 04/27/2021, 12:08 PM Pager: [phone]

## 2021-04-27 NOTE — ED Provider Notes (Signed)
[facility] EMERGENCY DEPARTMENT Provider Note   CSN: [id number] Arrival date & time: 04/27/21  1213     History No chief complaint on file.   Darin Garcia is a 60 y.o. male.  HPI    60 year old male comes in a chief complaint of A. fib.  Patient has history of paroxysmal A. fib, PE on anticoagulation.  Normally is not in A. fib.  This morning he woke up, feeling some palpitations.  He also had some shortness of breath.  Patient went on to ambulate, and noted that he was getting tired more easily.  At that time he knew something was off, and used one of his home cardiac device to check his pulse and noted that he was in A. fib.  Patient was advised to come to the ER.  Review of system is negative for any fevers, chills, cough, chest pain, recent infection.  Patient has been taking his medications as prescribed.  Past Medical History:  Diagnosis Date  . Atrial flutter (HCC)    a. s/p TEE DCCV 11/2018.  [name] Cardiomyopathy (HCC)   . Dyspnea   . GERD (gastroesophageal reflux disease)   . History of DVT (deep vein thrombosis)   . Pulmonary embolism [facility])     Patient Active Problem List   Diagnosis Date Noted  . Typical atrial flutter (HCC) 08/15/2019  . Cardiomyopathy (HCC) 11/25/2018  . Elevated troponin 11/25/2018  . Atrial flutter (HCC) 11/23/2018  . Dyspnea on exertion 05/15/2017  . Upper airway cough syndrome vs cough variant asthma 05/15/2017  . 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 A-FLUTTER ABLATION N/A 04/04/2019   Procedure: A-FLUTTER ABLATION;  Surgeon: Hillis Range, MD;  [facility]: [facility] INVASIVE CV LAB;  Service: Cardiovascular;  Laterality: N/A;  . A-FLUTTER ABLATION N/A 08/15/2019   Procedure: A-FLUTTER ABLATION;  Surgeon: Hillis Range, MD;  Location: [facility] INVASIVE CV LAB;  Service: Cardiovascular;  Laterality: N/A;  . ATRIAL FLUTTER ABLATION  08/15/2019   . CARDIOVERSION N/A 11/25/2018   Procedure: CARDIOVERSION;  Surgeon: Lewayne Bunting, MD;  Location: [facility] ENDOSCOPY;  Service: Cardiovascular;  Laterality: N/A;  . TEE WITHOUT CARDIOVERSION N/A 11/25/2018   Procedure: TRANSESOPHAGEAL ECHOCARDIOGRAM (TEE);  Surgeon: Lewayne Bunting, MD;  Location: [facility] ENDOSCOPY;  Service: Cardiovascular;  Laterality: N/A;  . TEE WITHOUT CARDIOVERSION  08/15/2019  . TEE WITHOUT CARDIOVERSION N/A 08/15/2019   Procedure: TRANSESOPHAGEAL ECHOCARDIOGRAM (TEE);  Surgeon: Jake Bathe, MD;  Location: [facility] ENDOSCOPY;  Service: Cardiovascular;  Laterality: N/A;       Family History  Problem Relation Age of Onset  . Pulmonary embolism Father   . CAD Sister     Social History   Tobacco Use  . Smoking status: Former Smoker    Packs/day: 1.00    Years: 4.00    Pack years: 4.00    Types: Cigarettes    Quit date: 11/23/1993    Years since quitting: 27.4  . Smokeless tobacco: Never Used  Vaping Use  . Vaping Use: Never used  Substance Use Topics  . Alcohol use: Yes    Comment: occ  . Drug use: Never    Home Medications Prior to Admission medications   Medication Sig Start Date End Date Taking? Authorizing Provider  rivaroxaban (XARELTO) 10 MG TABS tablet Take 1 tablet (10 mg total) by mouth daily. 08/30/20  Yes Allred, Fayrene Fearing, MD  albuterol (VENTOLIN HFA) 108 (  90 Base) MCG/ACT inhaler Inhale 2 puffs into the lungs every 6 (six) hours as needed for wheezing or shortness of breath. Patient not taking: Reported on 04/27/2021 10/11/19   Glenford Bayley, NP    Allergies    Patient has no known allergies.  Review of Systems   Review of Systems  Constitutional: Positive for activity change.  Cardiovascular: Positive for palpitations.  Neurological: Negative for dizziness.  Hematological: Bruises/bleeds easily.  All other systems reviewed and are negative.   Physical Exam Updated Vital Signs BP 115/75   Pulse (!) 50   Temp 97.6 F (36.4 C) (Oral)    Resp 20   SpO2 99%   Physical Exam Vitals and nursing note reviewed.  Constitutional:      Appearance: He is well-developed.  HENT:     Head: Atraumatic.  Cardiovascular:     Rate and Rhythm: Normal rate. Rhythm irregular.  Pulmonary:     Effort: Pulmonary effort is normal.  Musculoskeletal:     Cervical back: Neck supple.  Skin:    General: Skin is warm.  Neurological:     Mental Status: He is alert and oriented to person, place, and time.     ED Results / Procedures / Treatments   Labs (all labs ordered are listed, but only abnormal results are displayed) Labs Reviewed  BASIC METABOLIC PANEL  CBC    EKG EKG Interpretation  Date/Time:  Sunday April 27 2021 12:22:49 EDT Ventricular Rate:  140 PR Interval:    QRS Duration: 78 QT Interval:  266 QTC Calculation: 406 R Axis:   132 Text Interpretation: Atrial fibrillation with rapid ventricular response Right axis deviation Abnormal ECG No significant change since last tracing Confirmed by Tennyson Wacha ([id number]) on 04/27/2021 1:20:57 PM   Radiology DG Chest 2 View  Result Date: 04/27/2021 CLINICAL DATA:  Atrial fibrillation, dizziness, symptoms for 1 day. History of atrial flutter, cardiomyopathy, GERD, pulmonary embolism, DVT, former smoker EXAM: CHEST - 2 VIEW COMPARISON:  11/23/2018 FINDINGS: Normal heart size, mediastinal contours, and pulmonary vascularity. Lungs clear. No acute infiltrate, pleural effusion, or pneumothorax. Scattered endplate spur formation thoracic spine. IMPRESSION: No acute abnormalities. Electronically Signed   By: Mark  Boles M.D.   On: 04/27/2021 13:17    Procedures Procedures   Medications Ordered in ED Medications - No data to display  ED Course  I have reviewed the triage vital signs and the nursing notes.  Pertinent labs & imaging results that were available during my care of the patient were reviewed by me and considered in my medical decision making (see chart for details).     MDM Rules/Calculators/A&P                          59  year old comes in a chief complaint of palpitations.  He was in A. fib at arrival, in RVR.  However, when I went into see the patient, patient was back in normal sinus rhythm with a heart rate in the 50s.  He reports that his resting heart rate is normally in the 50s. Patient monitored for more than an hour, he remained in sinus rhythm, stable for discharge.  Labs are reassuring.  Advised that he follows up with A. fib clinic.   Final Clinical Impression(s) / ED Diagnoses Final diagnoses:  Atrial fibrillation, unspecified type (HCC)    Rx / DC Orders ED Discharge Orders    None       , MD  04/27/21 1516  

## 2021-04-27 NOTE — Discharge Instructions (Signed)
Follow-up with the A. fib clinic as requested.

## 2021-04-27 NOTE — ED Triage Notes (Signed)
Reports new onset dizziness, dyspnea, and chest pressure since this morning.  HR 130-150s and his Kardia device is reporting possible atrial fib.  Takes Xarelto.

## 2021-05-02 ENCOUNTER — Ambulatory Visit (HOSPITAL_COMMUNITY)
Admission: RE | Admit: 2021-05-02 | Discharge: 2021-05-02 | Disposition: A | Discharge status: Home / Self Care | Payer: Managed Care, Other (non HMO) | Source: Ambulatory Visit | Attending: Physician Assistant | Admitting: Physician Assistant

## 2021-05-02 ENCOUNTER — Other Ambulatory Visit: Payer: Self-pay

## 2021-05-02 VITALS — BP 118/74 | HR 56 | Ht 75.0 in | Wt 262.2 lb

## 2021-05-02 DIAGNOSIS — Z87891 Personal history of nicotine dependence: Secondary | ICD-10-CM | POA: Diagnosis not present

## 2021-05-02 DIAGNOSIS — Z6832 Body mass index (BMI) 32.0-32.9, adult: Secondary | ICD-10-CM | POA: Diagnosis not present

## 2021-05-02 DIAGNOSIS — I429 Cardiomyopathy, unspecified: Secondary | ICD-10-CM | POA: Diagnosis not present

## 2021-05-02 DIAGNOSIS — Z7901 Long term (current) use of anticoagulants: Secondary | ICD-10-CM | POA: Insufficient documentation

## 2021-05-02 DIAGNOSIS — Z8249 Family history of ischemic heart disease and other diseases of the circulatory system: Secondary | ICD-10-CM | POA: Insufficient documentation

## 2021-05-02 DIAGNOSIS — I4892 Unspecified atrial flutter: Secondary | ICD-10-CM | POA: Insufficient documentation

## 2021-05-02 DIAGNOSIS — I071 Rheumatic tricuspid insufficiency: Secondary | ICD-10-CM | POA: Insufficient documentation

## 2021-05-02 DIAGNOSIS — I480 Paroxysmal atrial fibrillation: Secondary | ICD-10-CM

## 2021-05-02 DIAGNOSIS — E669 Obesity, unspecified: Secondary | ICD-10-CM | POA: Insufficient documentation

## 2021-05-02 DIAGNOSIS — I493 Ventricular premature depolarization: Secondary | ICD-10-CM | POA: Diagnosis not present

## 2021-05-02 DIAGNOSIS — Z86718 Personal history of other venous thrombosis and embolism: Secondary | ICD-10-CM | POA: Insufficient documentation

## 2021-05-02 DIAGNOSIS — J984 Other disorders of lung: Secondary | ICD-10-CM | POA: Diagnosis not present

## 2021-05-02 DIAGNOSIS — Z86711 Personal history of pulmonary embolism: Secondary | ICD-10-CM | POA: Diagnosis not present

## 2021-05-02 MED ORDER — RIVAROXABAN 20 MG PO TABS: 20.0000 mg | ORAL_TABLET | Freq: Every day | ORAL | 3 refills | Status: DC

## 2021-05-02 MED ORDER — METOPROLOL TARTRATE 25 MG PO TABS: ORAL_TABLET | ORAL | 1 refills | Status: DC

## 2021-05-02 NOTE — Patient Instructions (Signed)
Increase Xarelto to 20mg  once a day with supper  Metoprolol 25mg  every 6 hours as needed for breakthrough afib  Will request appointment with Dr. to discuss ablation

## 2021-05-02 NOTE — Progress Notes (Signed)
Primary Care Physician: Cheron Schaumann., MD Primary Cardiologist: Dr Anne Fu Primary Electrophysiologist: Dr Johney Frame Referring Physician: Dr Margaretha Seeds is a 60 y.o. male with a history of typical atrial flutter s/p ablation, recurrent DVT and pulmonary embolism, cardiomyopathy (EF is 45-50% in the setting of rapidly conducting atrial flutter) who presents for follow up in the [facility]. Patient has a CHADS2VASC score of 1. He is s/p flutter ablation x 2 in 2020.   On follow up today, patient was seen at the ED 04/27/21 with tachypalpitations and dizziness. ECG showed rapid afib on arrival but he converted to SR spontaneously. He had another episode 04/30/21 which lasted for several hours. There were no specific triggers that he could identify.   Today, he denies symptoms of chest pain, orthopnea, PND, lower extremity edema, presyncope, syncope, daytime somnolence, bleeding, or neurologic sequela. The patient is tolerating medications without difficulties and is otherwise without complaint today.    Atrial Fibrillation Risk Factors:  he does not have symptoms or diagnosis of sleep apnea. Negative sleep study. he does not have a history of rheumatic fever. he does not have a history of alcohol use.   he has a BMI of Body mass index is 32.77 kg/m.[name] Filed Weights   05/02/21 0830  Weight: 118.9 kg     Family History  Problem Relation Age of Onset   Pulmonary embolism Father    CAD Sister      Atrial Fibrillation Management history:  Previous antiarrhythmic drugs: none Previous cardioversions: 11/25/18 Previous ablations: 04/04/19 flutter, 08/15/19 flutter CHADS2VASC score: 1 Anticoagulation history: Xarelto (has been on 10 mg for h/o DVT and PE)   Past Medical History:  Diagnosis Date   Atrial flutter (HCC)    a. s/p TEE DCCV 11/2018.   Cardiomyopathy (HCC)    Dyspnea    GERD (gastroesophageal reflux disease)    History of DVT  (deep vein thrombosis)    Pulmonary embolism (HCC)    Past Surgical History:  Procedure Laterality Date   A-FLUTTER ABLATION N/A 04/04/2019   Procedure: A-FLUTTER ABLATION;  Surgeon: Hillis Range, MD;  Location: [facility] INVASIVE CV LAB;  Service: Cardiovascular;  Laterality: N/A;   A-FLUTTER ABLATION N/A 08/15/2019   Procedure: A-FLUTTER ABLATION;  Surgeon: Hillis Range, MD;  Location: [facility] INVASIVE CV LAB;  Service: Cardiovascular;  Laterality: N/A;   ATRIAL FLUTTER ABLATION  08/15/2019   CARDIOVERSION N/A 11/25/2018   Procedure: CARDIOVERSION;  Surgeon: Lewayne Bunting, MD;  Location[facility] ENDOSCOPY;  Service: Cardiovascular;  Laterality: N/A;   TEE WITHOUT CARDIOVERSION N/A 11/25/2018   Procedure: TRANSESOPHAGEAL ECHOCARDIOGRAM (TEE);  Surgeon: Lewayne Bunting, MD;  Location: [facility] ENDOSCOPY;  Service: Cardiovascular;  Laterality: N/A;   TEE WITHOUT CARDIOVERSION  08/15/2019   TEE WITHOUT CARDIOVERSION N/A 08/15/2019   Procedure: TRANSESOPHAGEAL ECHOCARDIOGRAM (TEE);  Surgeon: Jake Bathe, MD;  Location: [facility] ENDOSCOPY;  Service: Cardiovascular;  Laterality: N/A;    Current Outpatient Medications  Medication Sig Dispense Refill   rivaroxaban (XARELTO) 10 MG TABS tablet Take 1 tablet (10 mg total) by mouth daily. 90 tablet 3   No current facility-administered medications for this encounter.    No Known Allergies  Social History   Socioeconomic History   Marital status: Married    Spouse name: michelle   Number of children: Not on file   Years of education: Not on file   Highest education level: Not on file  Occupational History    Employer:  [employer]  Tobacco Use   Smoking status: Former    Packs/day: 1.00    Years: 4.00    Pack years: 4.00    Types: Cigarettes    Quit date: 11/23/1993    Years since quitting: 27.4   Smokeless tobacco: Never  Vaping Use   Vaping Use: Never used  Substance and Sexual Activity   Alcohol use: Yes    Comment: occ   Drug use: Never   Sexual  activity: Not on file  Other Topics Concern   Not on file  Social History Narrative   He is originally from [place].  He moved to [place] 3 years ago.  He is married.  He has 2 children (23 and 18).  He works in Airline pilot for [employer].   Social Determinants of Health   Financial Resource Strain: Not on file  Food Insecurity: Not on file  Transportation Needs: Not on file  Physical Activity: Not on file  Stress: Not on file  Social Connections: Not on file  Intimate Partner Violence: Not on file     ROS- All systems are reviewed and negative except as per the HPI above.  Physical Exam: Vitals:   05/02/21 0830  BP: 118/74  Pulse: (!) 56  Weight: 118.9 kg  Height: 6\' 3"  (1.905 m)    GEN- The patient is a well appearing obese male, alert and oriented x 3 today.   HEENT-head normocephalic, atraumatic, sclera clear, conjunctiva pink, hearing intact, trachea midline. Lungs- Clear to ausculation bilaterally, normal work of breathing Heart- Regular rate and rhythm, no murmurs, rubs or gallops  GI- soft, NT, ND, + BS Extremities- no clubbing, cyanosis, or edema MS- no significant deformity or atrophy Skin- no rash or lesion Psych- euthymic mood, full affect Neuro- strength and sensation are intact   Wt Readings from Last 3 Encounters:  05/02/21 118.9 kg  08/30/20 119.7 kg  02/05/20 118.6 kg    EKG today demonstrates  SB, PVC  Echo 12/28/19 demonstrated   1. Left ventricular ejection fraction, by visual estimation, is 60 to  65%. The left ventricle has normal function. There is mildly increased  left ventricular hypertrophy.   2. Left ventricular diastolic parameters are consistent with Grade I  diastolic dysfunction (impaired relaxation).   3. The left ventricle has no regional wall motion abnormalities.   4. Global right ventricle has normal systolic function.The right  ventricular size is normal. No increase in right ventricular wall  thickness.   5. Left  atrial size was normal.   6. Right atrial size was normal.   7. The mitral valve is normal in structure. No evidence of mitral valve  regurgitation. No evidence of mitral stenosis.   8. The tricuspid valve is normal in structure.   9. The tricuspid valve is normal in structure. Tricuspid valve  regurgitation is mild.  10. The aortic valve is normal in structure. Aortic valve regurgitation is  not visualized. No evidence of aortic valve sclerosis or stenosis.  11. The pulmonic valve was normal in structure. Pulmonic valve  regurgitation is not visualized.  12. Normal pulmonary artery systolic pressure.  13. The inferior vena cava is normal in size with greater than 50%  respiratory variability, suggesting right atrial pressure of 3 mmHg.   Epic records are reviewed at length today  Assessment and Plan:  1. Paroxysmal atrial fibrillation/Atrial flutter S/p typical flutter ablation 04/04/19 with repeat ablation 08/15/19. Now with a diagnosis of afib.  We discussed therapeutic options  today including AAD vs ablation. He would prefer to not take additional medication if possible. Will refer back to Dr Johney Frame to consider afib ablation.  Start Lopressor 25 mg q6 hours PRN for heart racing. lopressor?  Will increase Xarelto to 20 mg daily in anticipation of ablation.  (On Xarelto 10 mg daily for prior PE/DVT)  This patients CHA2DS2-VASc Score and unadjusted Ischemic Stroke Rate (% per year) is equal to 0.6 % stroke rate/year from a score of 1  Above score calculated as 1 point each if present [CHF, HTN, DM, Vascular=MI/PAD/Aortic Plaque, Age if 65-74, or Male] Above score calculated as 2 points each if present [Age > 75, or Stroke/TIA/TE]  2. Obesity Body mass index is 32.77 kg/m. Lifestyle modification was discussed and encouraged including regular physical activity and weight reduction.  3. NICM Suspected tachycardia mediated.  EF normalized with SR. No signs or symptoms of fluid  overload.   Follow up with Dr Johney Frame.    Jorja Loa PA-C [facility] [address] [place], [place] [address] [phone] 05/02/2021 8:50 AM

## 2021-05-06 ENCOUNTER — Ambulatory Visit (HOSPITAL_COMMUNITY): Payer: Managed Care, Other (non HMO) | Admitting: Physician Assistant

## 2021-05-28 ENCOUNTER — Encounter (HOSPITAL_BASED_OUTPATIENT_CLINIC_OR_DEPARTMENT_OTHER): Payer: Self-pay | Admitting: Internal Medicine

## 2021-05-28 ENCOUNTER — Encounter: Payer: Self-pay | Admitting: *Deleted

## 2021-05-28 ENCOUNTER — Ambulatory Visit (INDEPENDENT_AMBULATORY_CARE_PROVIDER_SITE_OTHER): Payer: Managed Care, Other (non HMO) | Admitting: Internal Medicine

## 2021-05-28 ENCOUNTER — Other Ambulatory Visit: Payer: Self-pay

## 2021-05-28 VITALS — BP 112/66 | HR 57 | Ht 75.0 in | Wt 256.0 lb

## 2021-05-28 DIAGNOSIS — R000 Tachycardia, unspecified: Secondary | ICD-10-CM | POA: Diagnosis not present

## 2021-05-28 DIAGNOSIS — Z86711 Personal history of pulmonary embolism: Secondary | ICD-10-CM | POA: Diagnosis not present

## 2021-05-28 DIAGNOSIS — I480 Paroxysmal atrial fibrillation: Secondary | ICD-10-CM

## 2021-05-28 DIAGNOSIS — R079 Chest pain, unspecified: Secondary | ICD-10-CM

## 2021-05-28 DIAGNOSIS — R0602 Shortness of breath: Secondary | ICD-10-CM

## 2021-05-28 DIAGNOSIS — I43 Cardiomyopathy in diseases classified elsewhere: Secondary | ICD-10-CM

## 2021-05-28 NOTE — Patient Instructions (Addendum)
Medication Instructions:  Your physician recommends that you continue on your current medications as directed. Please refer to the Current Medication list given to you today.  Labwork: None ordered.  Testing/Procedures: Your physician has requested that you have an echocardiogram. Echocardiography is a painless test that uses sound waves to create images of your heart. It provides your doctor with information about the size and shape of your heart and how well your heart's chambers and valves are working. This procedure takes approximately one hour. There are no restrictions for this procedure.   Your physician has requested that you have cardiac CT. Cardiac computed tomography (CT) is a painless test that uses an x-ray machine to take clear, detailed pictures of your heart. For further information please visit www.cardiosmart.org. Please follow instruction sheet as given.  Your physician has recommended that you have an ablation. Catheter ablation is a medical procedure used to treat some cardiac arrhythmias (irregular heartbeats). During catheter ablation, a long, thin, flexible tube is put into a blood vessel in your groin (upper thigh), or neck. This tube is called an ablation catheter. It is then guided to your heart through the blood vessel. Radio frequency waves destroy small areas of heart tissue where abnormal heartbeats may cause an arrhythmia to start. Please see the instruction sheet given to you today.   Any Other Special Instructions Will Be Listed Below (If Applicable).  If you need a refill on your cardiac medications before your next appointment, please call your pharmacy.   Cardiac Ablation Cardiac ablation is a procedure to destroy (ablate) some heart tissue that is sending bad signals. These bad signals cause problems in heart rhythm. The heart has many areas that make these signals. If there are problems in these areas, they can make the heart beat in a way that is not  normal. Destroying some tissues can help make the heart rhythm normal. Tell your doctor about: Any allergies you have. All medicines you are taking. These include vitamins, herbs, eye drops, creams, and over-the-counter medicines. Any problems you or family members have had with medicines that make you fall asleep (anesthetics). Any blood disorders you have. Any surgeries you have had. Any medical conditions you have, such as kidney failure. Whether you are pregnant or may be pregnant. What are the risks? This is a safe procedure. But problems may occur, including: Infection. Bruising and bleeding. Bleeding into the chest. Stroke or blood clots. Damage to nearby areas of your body. Allergies to medicines or dyes. The need for a pacemaker if the normal system is damaged. Failure of the procedure to treat the problem. What happens before the procedure? Medicines Ask your doctor about: Changing or stopping your normal medicines. This is important. Taking aspirin and ibuprofen. Do not take these medicines unless your doctor tells you to take them. Taking other medicines, vitamins, herbs, and supplements. General instructions Follow instructions from your doctor about what you cannot eat or drink. Plan to have someone take you home from the hospital or clinic. If you will be going home right after the procedure, plan to have someone with you for 24 hours. Ask your doctor what steps will be taken to prevent infection. What happens during the procedure?  An IV tube will be put into one of your veins. You will be given a medicine to help you relax. The skin on your neck or groin will be numbed. A cut (incision) will be made in your neck or groin. A needle will be put   through your cut and into a large vein. A tube (catheter) will be put into the needle. The tube will be moved to your heart. Dye may be put through the tube. This helps your doctor see your heart. Small devices (electrodes)  on the tube will send out signals. A type of energy will be used to destroy some heart tissue. The tube will be taken out. Pressure will be held on your cut. This helps stop bleeding. A bandage will be put over your cut. The exact procedure may vary among doctors and hospitals. What happens after the procedure? You will be watched until you leave the hospital or clinic. This includes checking your heart rate, breathing rate, oxygen, and blood pressure. Your cut will be watched for bleeding. You will need to lie still for a few hours. Do not drive for 24 hours or as long as your doctor tells you. Summary Cardiac ablation is a procedure to destroy some heart tissue. This is done to treat heart rhythm problems. Tell your doctor about any medical conditions you may have. Tell him or her about all medicines you are taking to treat them. This is a safe procedure. But problems may occur. These include infection, bruising, bleeding, and damage to nearby areas of your body. Follow what your doctor tells you about food and drink. You may also be told to change or stop some of your medicines. After the procedure, do not drive for 24 hours or as long as your doctor tells you. This information is not intended to replace advice given to you by your health care provider. Make sure you discuss any questions you have with your health care provider. Document Revised: 10/12/2019 Document Reviewed: 10/12/2019 Elsevier Patient Education  2022 [employer] Inc.      

## 2021-05-28 NOTE — Progress Notes (Signed)
PCP: Cheron Schaumann., MD Primary Cardiologist: Dr Anne Fu Primary EP: Dr Johney Frame  Darin Garcia is a 60 y.o. male who presents today for routine electrophysiology followup.  Since last being seen in our clinic, the patient reports doing reasonably well.  He presented 04/27/21 with tachypalpitations and was found to have afib. He has had ongoing episodes of afib since that time. He has also had episodes of intermittent SOB with chest discomfort, radiating into his back.  These occur when in sinus rhythm. Today, he denies symptoms of lower extremity edema, dizziness, presyncope, or syncope.  The patient is otherwise without complaint today.   Past Medical History:  Diagnosis Date   Atrial flutter (HCC)    a. s/p TEE DCCV 11/2018.   Cardiomyopathy (HCC)    Dyspnea    GERD (gastroesophageal reflux disease)    History of DVT (deep vein thrombosis)    Pulmonary embolism (HCC)    Past Surgical History:  Procedure Laterality Date   A-FLUTTER ABLATION N/A 04/04/2019   Procedure: A-FLUTTER ABLATION;  Surgeon: Hillis Range, MD;  Location: [facility]IVE CV LAB;  Service: Cardiovascular;  Laterality: N/A;   A-FLUTTER ABLATION N/A 08/15/2019   Procedure: A-FLUTTER ABLATION;  Surgeon: Hillis Range, MD;  Location: [facility] INVASIVE CV LAB;  Service: Cardiovascular;  Laterality: N/A;   ATRIAL FLUTTER ABLATION  08/15/2019   CARDIOVERSION N/A 11/25/2018   Procedure: CARDIOVERSION;  Surgeon: Lewayne Bunting, MD;  Location: [facility] ENDOSCOPY;  Service: Cardiovascular;  Laterality: N/A;   TEE WITHOUT CARDIOVERSION N/A 11/25/2018   Procedure: TRANSESOPHAGEAL ECHOCARDIOGRAM (TEE);  Surgeon: Lewayne Bunting, MD;  Location: [facility] ENDOSCOPY;  Service: Cardiovascular;  Laterality: N/A;   TEE WITHOUT CARDIOVERSION  08/15/2019   TEE WITHOUT CARDIOVERSION N/A 08/15/2019   Procedure: TRANSESOPHAGEAL ECHOCARDIOGRAM (TEE);  Surgeon: Jake Bathe, MD;  Location: [facility] ENDOSCOPY;  Service: Cardiovascular;  Laterality: N/A;     ROS- all systems are reviewed and negatives except as per HPI above  Current Outpatient Medications  Medication Sig Dispense Refill   metoprolol tartrate (LOPRESSOR) 25 MG tablet Take 1 tablet by mouth every 6 hours as needed for breakthrough afib 30 tablet 1   rivaroxaban (XARELTO) 20 MG TABS tablet Take 1 tablet (20 mg total) by mouth daily. 30 tablet 3   No current facility-administered medications for this visit.    Physical Exam: Vitals:   05/28/21 1431  BP: 112/66  Pulse: (!) 57  SpO2: 95%  Weight: 256 lb (116.1 kg)  Height: 6\' 3"  (1.905 m)    GEN- The patient is well appearing, alert and oriented x 3 today.   Head- normocephalic, atraumatic Eyes-  Sclera clear, conjunctiva pink Ears- hearing intact Oropharynx- clear Lungs- Clear to ausculation bilaterally, normal work of breathing Heart- Regular rate and rhythm, no murmurs, rubs or gallops, PMI not laterally displaced GI- soft, NT, ND, + BS Extremities- no clubbing, cyanosis, or edema  Wt Readings from Last 3 Encounters:  05/28/21 256 lb (116.1 kg)  05/02/21 262 lb 3.2 oz (118.9 kg)  08/30/20 264 lb (119.7 kg)    EKG tracing ordered today is personally reviewed and shows sinus rhythm 57 bpm,  Echo 12/28/19- EF 60%  Assessment and Plan:  Paroxysmal atrial fibrillation New diagnosis He is s/p prior CTI ablation x 2 Chads2vasc score is 1.  He is on xarelto The patient has symptomatic, recurrent  atrial fibrillation. Therapeutic strategies for afib including medicine and ablation were discussed in detail with the patient today. Risk, benefits, and alternatives  to EP study and radiofrequency ablation for afib were also discussed in detail today. These risks include but are not limited to stroke, bleeding, vascular damage, tamponade, perforation, damage to the esophagus, lungs, and other structures, pulmonary vein stenosis, worsening renal function, and death. The patient understands these risk and wishes to  proceed.  We will therefore proceed with catheter ablation at the next available time.  Carto, ICE, anesthesia are requested for the procedure.  Will also obtain cardiac CT prior to the procedure to exclude LAA thrombus and further evaluate atrial anatomy.  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 Prior DVT Requires long term xarelto (previously on 10mg  daily)  4. SOB/ chest discomfort Echo Cardiac CT with FFR to evaluate for CAD  Risks, benefits and potential toxicities for medications prescribed and/or refilled reviewed with patient today.   MD, [facility] 05/28/2021 3:03 PM

## 2021-05-29 ENCOUNTER — Encounter (HOSPITAL_BASED_OUTPATIENT_CLINIC_OR_DEPARTMENT_OTHER): Payer: Self-pay

## 2021-06-18 ENCOUNTER — Other Ambulatory Visit: Payer: Self-pay

## 2021-06-18 ENCOUNTER — Ambulatory Visit (HOSPITAL_COMMUNITY): Payer: Managed Care, Other (non HMO) | Attending: Internal Medicine

## 2021-06-18 DIAGNOSIS — Z86711 Personal history of pulmonary embolism: Secondary | ICD-10-CM

## 2021-06-18 DIAGNOSIS — R0602 Shortness of breath: Secondary | ICD-10-CM

## 2021-06-18 DIAGNOSIS — I480 Paroxysmal atrial fibrillation: Secondary | ICD-10-CM | POA: Diagnosis not present

## 2021-06-18 DIAGNOSIS — I43 Cardiomyopathy in diseases classified elsewhere: Secondary | ICD-10-CM

## 2021-06-18 DIAGNOSIS — R000 Tachycardia, unspecified: Secondary | ICD-10-CM

## 2021-06-18 LAB — ECHOCARDIOGRAM COMPLETE
Area-P 1/2: 3.31 cm2
S' Lateral: 3.9 cm

## 2021-07-01 ENCOUNTER — Other Ambulatory Visit: Payer: Managed Care, Other (non HMO)

## 2021-07-01 ENCOUNTER — Other Ambulatory Visit: Payer: Self-pay

## 2021-07-01 DIAGNOSIS — I43 Cardiomyopathy in diseases classified elsewhere: Secondary | ICD-10-CM

## 2021-07-01 DIAGNOSIS — I480 Paroxysmal atrial fibrillation: Secondary | ICD-10-CM

## 2021-07-01 DIAGNOSIS — Z86711 Personal history of pulmonary embolism: Secondary | ICD-10-CM

## 2021-07-01 DIAGNOSIS — R0602 Shortness of breath: Secondary | ICD-10-CM

## 2021-07-01 LAB — BASIC METABOLIC PANEL
BUN/Creatinine Ratio: 13 (ref 9–20)
BUN: 12 mg/dL (ref 6–24)
CO2: 28 mmol/L (ref 20–29)
Calcium: 9 mg/dL (ref 8.7–10.2)
Chloride: 105 mmol/L (ref 96–106)
Creatinine, Ser: 0.94 mg/dL (ref 0.76–1.27)
Glucose: 103 mg/dL — ABNORMAL HIGH (ref 65–99)
Potassium: 4.7 mmol/L (ref 3.5–5.2)
Sodium: 139 mmol/L (ref 134–144)
eGFR: 94 mL/min/{1.73_m2} (ref >59)

## 2021-07-01 LAB — CBC WITH DIFFERENTIAL/PLATELET
Basophils Absolute: 0 10*3/uL (ref 0.0–0.2)
Basos: 0 %
EOS (ABSOLUTE): 0.1 10*3/uL (ref 0.0–0.4)
Eos: 1 %
Hematocrit: 43.7 % (ref 37.5–51.0)
Hemoglobin: 14.9 g/dL (ref 13.0–17.7)
Lymphocytes Absolute: 1.8 10*3/uL (ref 0.7–3.1)
Lymphs: 36 %
MCH: 30.8 pg (ref 26.6–33.0)
MCHC: 34.1 g/dL (ref 31.5–35.7)
MCV: 91 fL (ref 79–97)
Monocytes Absolute: 0.6 10*3/uL (ref 0.1–0.9)
Monocytes: 12 %
Neutrophils Absolute: 2.6 10*3/uL (ref 1.4–7.0)
Neutrophils: 51 %
Platelets: 237 10*3/uL (ref 150–450)
RBC: 4.83 x10E6/uL (ref 4.14–5.80)
RDW: 13.2 % (ref 11.6–15.4)
WBC: 5 10*3/uL (ref 3.4–10.8)

## 2021-07-07 NOTE — Addendum Note (Signed)
Addended by: Sampson Goon on: 07/07/2021 08:44 AM   Modules accepted: Orders

## 2021-07-08 ENCOUNTER — Telehealth (HOSPITAL_COMMUNITY): Payer: Self-pay | Admitting: Emergency Medicine

## 2021-07-08 NOTE — Telephone Encounter (Signed)
Reaching out to patient to offer assistance regarding upcoming cardiac imaging study; pt verbalizes understanding of appt date/time, parking situation and where to check in, pre-test NPO status and medications ordered, and verified current allergies; name and call back number provided for further questions should they arise Rockwell Alexandria RN Navigator Cardiac Imaging [facility] [phone] office [phone] cell   Denies iv issues Denies claustro 25mg  metoprolol tart  Coronary + delay for LAA

## 2021-07-10 ENCOUNTER — Other Ambulatory Visit: Payer: Self-pay

## 2021-07-10 ENCOUNTER — Ambulatory Visit (HOSPITAL_COMMUNITY)
Admission: RE | Admit: 2021-07-10 | Discharge: 2021-07-10 | Disposition: A | Discharge status: Home / Self Care | Payer: Managed Care, Other (non HMO) | Source: Ambulatory Visit | Attending: Internal Medicine | Admitting: Internal Medicine

## 2021-07-10 DIAGNOSIS — R079 Chest pain, unspecified: Secondary | ICD-10-CM | POA: Insufficient documentation

## 2021-07-10 MED ORDER — NITROGLYCERIN 0.4 MG SL SUBL
0.8000 mg | SUBLINGUAL_TABLET | Freq: Once | SUBLINGUAL | Status: AC
  Administered 2021-07-10: 0.8 mg via SUBLINGUAL

## 2021-07-10 MED ORDER — NITROGLYCERIN 0.4 MG SL SUBL
SUBLINGUAL_TABLET | SUBLINGUAL | Status: AC
  Filled 2021-07-10: qty 2

## 2021-07-10 MED ORDER — IOHEXOL 350 MG/ML SOLN
95.0000 mL | Freq: Once | INTRAVENOUS | Status: AC | PRN
  Administered 2021-07-10: 95 mL via INTRAVENOUS

## 2021-07-11 DIAGNOSIS — R079 Chest pain, unspecified: Secondary | ICD-10-CM | POA: Diagnosis not present

## 2021-07-16 NOTE — Pre-Procedure Instructions (Signed)
Instructed patient on the following items: Arrival time 0830 Nothing to eat or drink after midnight No meds AM of procedure Responsible person to drive you home and stay with you for 24 hrs  Have you missed any doses of anti-coagulant Xarelto-hasn't missed any doses   

## 2021-07-17 ENCOUNTER — Other Ambulatory Visit (HOSPITAL_COMMUNITY): Payer: Self-pay | Admitting: Emergency Medicine

## 2021-07-17 ENCOUNTER — Ambulatory Visit (HOSPITAL_COMMUNITY)
Admission: RE | Admit: 2021-07-17 | Discharge: 2021-07-17 | Disposition: A | Discharge status: Home / Self Care | Payer: Managed Care, Other (non HMO) | Source: Home / Self Care | Attending: Cardiology | Admitting: Cardiology

## 2021-07-17 ENCOUNTER — Encounter (HOSPITAL_COMMUNITY)
Admission: RE | Disposition: A | Discharge status: Home / Self Care | Payer: Managed Care, Other (non HMO) | Source: Home / Self Care | Attending: Internal Medicine

## 2021-07-17 ENCOUNTER — Ambulatory Visit (HOSPITAL_COMMUNITY): Payer: Managed Care, Other (non HMO) | Admitting: Certified Registered"

## 2021-07-17 ENCOUNTER — Ambulatory Visit (HOSPITAL_COMMUNITY)
Admission: RE | Admit: 2021-07-17 | Discharge: 2021-07-17 | Disposition: A | Discharge status: Home / Self Care | Payer: Managed Care, Other (non HMO) | Attending: Internal Medicine | Admitting: Internal Medicine

## 2021-07-17 DIAGNOSIS — Z79899 Other long term (current) drug therapy: Secondary | ICD-10-CM | POA: Diagnosis not present

## 2021-07-17 DIAGNOSIS — I480 Paroxysmal atrial fibrillation: Secondary | ICD-10-CM | POA: Insufficient documentation

## 2021-07-17 DIAGNOSIS — Z7901 Long term (current) use of anticoagulants: Secondary | ICD-10-CM | POA: Diagnosis not present

## 2021-07-17 DIAGNOSIS — R931 Abnormal findings on diagnostic imaging of heart and coronary circulation: Secondary | ICD-10-CM

## 2021-07-17 DIAGNOSIS — R079 Chest pain, unspecified: Secondary | ICD-10-CM

## 2021-07-17 HISTORY — PX: ATRIAL FIBRILLATION ABLATION: EP1191

## 2021-07-17 LAB — POCT ACTIVATED CLOTTING TIME
Activated Clotting Time: 196 seconds
Activated Clotting Time: 260 seconds
Activated Clotting Time: 271 seconds

## 2021-07-17 SURGERY — ATRIAL FIBRILLATION ABLATION
Anesthesia: General

## 2021-07-17 MED ORDER — HEPARIN SODIUM (PORCINE) 1000 UNIT/ML IJ SOLN
INTRAMUSCULAR | Status: DC | PRN
  Administered 2021-07-17: 15000 [IU] via INTRAVENOUS
  Administered 2021-07-17: 1000 [IU] via INTRAVENOUS

## 2021-07-17 MED ORDER — LIDOCAINE 2% (20 MG/ML) 5 ML SYRINGE
INTRAMUSCULAR | Status: DC | PRN
Start: 2021-07-17 — End: 2021-07-17
  Administered 2021-07-17: 100 mg via INTRAVENOUS

## 2021-07-17 MED ORDER — FENTANYL CITRATE (PF) 100 MCG/2ML IJ SOLN: INTRAMUSCULAR | Status: DC | PRN

## 2021-07-17 MED ORDER — MIDAZOLAM HCL 5 MG/5ML IJ SOLN
INTRAMUSCULAR | Status: DC | PRN
Start: 2021-07-17 — End: 2021-07-17
  Administered 2021-07-17: 2 mg via INTRAVENOUS

## 2021-07-17 MED ORDER — SODIUM CHLORIDE 0.9 % IV SOLN: INTRAVENOUS | Status: DC

## 2021-07-17 MED ORDER — HEPARIN SODIUM (PORCINE) 1000 UNIT/ML IJ SOLN
INTRAMUSCULAR | Status: AC
  Filled 2021-07-17: qty 1

## 2021-07-17 MED ORDER — HEPARIN (PORCINE) IN NACL 1000-0.9 UT/500ML-% IV SOLN
INTRAVENOUS | Status: AC
  Filled 2021-07-17: qty 500

## 2021-07-17 MED ORDER — ISOPROTERENOL HCL 0.2 MG/ML IJ SOLN
INTRAVENOUS | Status: DC | PRN
  Administered 2021-07-17: 10 ug/min via INTRAVENOUS

## 2021-07-17 MED ORDER — ONDANSETRON HCL 4 MG/2ML IJ SOLN: 4.0000 mg | Freq: Four times a day (QID) | INTRAMUSCULAR | Status: DC | PRN

## 2021-07-17 MED ORDER — ACETAMINOPHEN 325 MG PO TABS
650.0000 mg | ORAL_TABLET | ORAL | Status: DC | PRN
  Filled 2021-07-17: qty 2

## 2021-07-17 MED ORDER — PROTAMINE SULFATE 10 MG/ML IV SOLN
INTRAVENOUS | Status: DC | PRN
Start: 2021-07-17 — End: 2021-07-17
  Administered 2021-07-17: 30 mg via INTRAVENOUS

## 2021-07-17 MED ORDER — SUGAMMADEX SODIUM 200 MG/2ML IV SOLN
INTRAVENOUS | Status: DC | PRN
  Administered 2021-07-17: 400 mg via INTRAVENOUS

## 2021-07-17 MED ORDER — HYDROCODONE-ACETAMINOPHEN 5-325 MG PO TABS
1.0000 | ORAL_TABLET | ORAL | Status: DC | PRN
  Filled 2021-07-17: qty 2

## 2021-07-17 MED ORDER — DEXAMETHASONE SODIUM PHOSPHATE 10 MG/ML IJ SOLN
INTRAMUSCULAR | Status: DC | PRN
  Administered 2021-07-17: 10 mg via INTRAVENOUS

## 2021-07-17 MED ORDER — ISOPROTERENOL HCL 0.2 MG/ML IJ SOLN
INTRAMUSCULAR | Status: AC
  Filled 2021-07-17: qty 5

## 2021-07-17 MED ORDER — ROCURONIUM BROMIDE 10 MG/ML (PF) SYRINGE
PREFILLED_SYRINGE | INTRAVENOUS | Status: DC | PRN
  Administered 2021-07-17: 40 mg via INTRAVENOUS
  Administered 2021-07-17: 60 mg via INTRAVENOUS

## 2021-07-17 MED ORDER — PANTOPRAZOLE SODIUM 40 MG PO TBEC: 40.0000 mg | DELAYED_RELEASE_TABLET | Freq: Every day | ORAL | 0 refills | Status: DC

## 2021-07-17 MED ORDER — HEPARIN SODIUM (PORCINE) 1000 UNIT/ML IJ SOLN
INTRAMUSCULAR | Status: DC | PRN
  Administered 2021-07-17: 6000 [IU] via INTRAVENOUS
  Administered 2021-07-17: 3000 [IU] via INTRAVENOUS

## 2021-07-17 MED ORDER — FENTANYL CITRATE (PF) 100 MCG/2ML IJ SOLN
INTRAMUSCULAR | Status: DC | PRN
  Administered 2021-07-17: 100 ug via INTRAVENOUS

## 2021-07-17 MED ORDER — PHENYLEPHRINE HCL-NACL 20-0.9 MG/250ML-% IV SOLN
INTRAVENOUS | Status: DC | PRN
  Administered 2021-07-17: 25 ug/min via INTRAVENOUS

## 2021-07-17 MED ORDER — SODIUM CHLORIDE 0.9% FLUSH: 3.0000 mL | INTRAVENOUS | Status: DC | PRN

## 2021-07-17 MED ORDER — PROPOFOL 10 MG/ML IV BOLUS
INTRAVENOUS | Status: DC | PRN
  Administered 2021-07-17: 200 mg via INTRAVENOUS

## 2021-07-17 MED ORDER — SODIUM CHLORIDE 0.9% FLUSH: 3.0000 mL | Freq: Two times a day (BID) | INTRAVENOUS | Status: DC

## 2021-07-17 MED ORDER — HEPARIN (PORCINE) IN NACL 1000-0.9 UT/500ML-% IV SOLN
INTRAVENOUS | Status: DC | PRN
  Administered 2021-07-17 (×3): 500 mL

## 2021-07-17 MED ORDER — SODIUM CHLORIDE 0.9 % IV SOLN: 250.0000 mL | INTRAVENOUS | Status: DC | PRN

## 2021-07-17 SURGICAL SUPPLY — 17 items
BLANKET WARM UNDERBOD FULL ACC (MISCELLANEOUS) ×2 IMPLANT
CATH OCTARAY 1.5 F (CATHETERS) ×2 IMPLANT
CATH SMTCH THERMOCOOL SF DF (CATHETERS) ×2 IMPLANT
CATH SOUNDSTAR ECO 8FR (CATHETERS) ×2 IMPLANT
CATH WEBSTER BI DIR CS D-F CRV (CATHETERS) ×2 IMPLANT
CLOSURE PERCLOSE PROSTYLE (VASCULAR PRODUCTS) ×6 IMPLANT
COVER SWIFTLINK CONNECTOR (BAG) ×2 IMPLANT
NEEDLE BAYLIS TRANSSEPTAL 71CM (NEEDLE) ×2 IMPLANT
PACK EP LATEX FREE (CUSTOM PROCEDURE TRAY) ×1
PACK EP LF (CUSTOM PROCEDURE TRAY) ×1 IMPLANT
PAD PRO RADIOLUCENT 2001M-C (PAD) ×2 IMPLANT
PATCH CARTO3 (PAD) ×2 IMPLANT
SHEATH PINNACLE 7F 10CM (SHEATH) ×4 IMPLANT
SHEATH PINNACLE 9F 10CM (SHEATH) ×2 IMPLANT
SHEATH PROBE COVER 6X72 (BAG) ×2 IMPLANT
SHEATH SWARTZ TS SL2 63CM 8.5F (SHEATH) ×2 IMPLANT
TUBING SMART ABLATE COOLFLOW (TUBING) ×2 IMPLANT

## 2021-07-17 NOTE — Transfer of Care (Signed)
Immediate Anesthesia Transfer of Care Note  Patient: Darin Garcia  Procedure(s) Performed: ATRIAL FIBRILLATION ABLATION  Patient Location: Cath Lab  Anesthesia Type:General  Level of Consciousness: awake, alert  and oriented  Airway & Oxygen Therapy: Patient connected to nasal cannula oxygen  Post-op Assessment: Post -op Vital signs reviewed and stable  Post vital signs: stable  Last Vitals:  Vitals Value Taken Time  BP    Temp    Pulse    Resp    SpO2      Last Pain:  Vitals:   07/17/21 0835  TempSrc:   PainSc: 0-No pain         Complications: No notable events documented.

## 2021-07-17 NOTE — H&P (Signed)
Darin Garcia is a 60 y.o. male who presents today for electrophysiology study and ablation for afib.  Since last being seen in our clinic, the patient reports doing reasonably well.  He presented 04/27/21 with tachypalpitations and was found to have afib. He has had ongoing episodes of afib since that time. He has also had episodes of intermittent SOB with chest discomfort, radiating into his back.  These occur when in sinus rhythm. Today, he denies symptoms of lower extremity edema, dizziness, presyncope, or syncope.  The patient is otherwise without complaint today.        Past Medical History:  Diagnosis Date   Atrial flutter (HCC)      a. s/p TEE DCCV 11/2018.   Cardiomyopathy (HCC)     Dyspnea     GERD (gastroesophageal reflux disease)     History of DVT (deep vein thrombosis)     Pulmonary embolism (HCC)           Past Surgical History:  Procedure Laterality Date   A-FLUTTER ABLATION N/A 04/04/2019    Procedure: A-FLUTTER ABLATION;  Surgeon: Hillis Range, MD;  Location: [facility] INVASIVE CV LAB;  Service: Cardiovascular;  Laterality: N/A;   A-FLUTTER ABLATION N/A 08/15/2019    Procedure: A-FLUTTER ABLATION;  Surgeon: Hillis Range, MD;  Location: [facility]VASIVE CV LAB;  Service: Cardiovascular;  Laterality: N/A;   ATRIAL FLUTTER ABLATION   08/15/2019   CARDIOVERSION N/A 11/25/2018    Procedure: CARDIOVERSION;  Surgeon: Lewayne Bunting, MD;  Location: [facility] ENDOSCOPY;  Service: Cardiovascular;  Laterality: N/A;   TEE WITHOUT CARDIOVERSION N/A 11/25/2018    Procedure: TRANSESOPHAGEAL ECHOCARDIOGRAM (TEE);  Surgeon: Lewayne Bunting, MD;  Location: [facility] ENDOSCOPY;  Service: Cardiovascular;  Laterality: N/A;   TEE WITHOUT CARDIOVERSION   08/15/2019   TEE WITHOUT CARDIOVERSION N/A 08/15/2019    Procedure: TRANSESOPHAGEAL ECHOCARDIOGRAM (TEE);  Surgeon: Jake Bathe, MD;  Location: [facility] ENDOSCOPY;  Service: Cardiovascular;  Laterality: N/A;      ROS- all systems are reviewed and negatives except as  per HPI above         Current Outpatient Medications  Medication Sig Dispense Refill   metoprolol tartrate (LOPRESSOR) 25 MG tablet Take 1 tablet by mouth every 6 hours as needed for breakthrough afib 30 tablet 1   rivaroxaban (XARELTO) 20 MG TABS tablet Take 1 tablet (20 mg total) by mouth daily. 30 tablet 3    No current facility-administered medications for this visit.      Physical Exam: Vitals:   07/17/21 0828  BP: 133/76  Pulse: (!) 52  Resp: 18  Temp: 97.6 F (36.4 C)  SpO2: 100%      GEN- The patient is well appearing, alert and oriented x 3 today.   Head- normocephalic, atraumatic Eyes-  Sclera clear, conjunctiva pink Ears- hearing intact Oropharynx- clear Lungs- Clear to ausculation bilaterally, normal work of breathing Heart- Regular rate and rhythm, no murmurs, rubs or gallops, PMI not laterally displaced GI- soft, NT, ND, + BS Extremities- no clubbing, cyanosis, or edema      Wt Readings from Last 3 Encounters:  05/28/21 256 lb (116.1 kg)  05/02/21 262 lb 3.2 oz (118.9 kg)  08/30/20 264 lb (119.7 kg)         Assessment and Plan:   Paroxysmal atrial fibrillation New diagnosis He is s/p prior CTI ablation x 2 Chads2vasc score is 1.  He is on xarelto  Risk, benefits, and alternatives to EP study and radiofrequency ablation for afib  were again discussed in detail today. These risks include but are not limited to stroke, bleeding, vascular damage, tamponade, perforation, damage to the esophagus, lungs, and other structures, pulmonary vein stenosis, worsening renal function, and death. The patient understands these risk and wishes to proceed.    Cardiac CT reviewed at length with the patient today.  Awaiting FFR.  No LA thrombus.  he reports compliance with xarelto 20mg  daily without interruption.  MD, [facility] 07/17/2021 10:06 AM

## 2021-07-17 NOTE — Discharge Instructions (Addendum)
Post procedure care instructions No driving for 4 days. No lifting over 5 lbs for 1 week. No vigorous or sexual activity for 1 week. You may return to work/your usual activities on 07/25/21. Keep procedure site clean & dry. If you notice increased pain, swelling, bleeding or pus, call/return!  You may shower after 24 hours, but no soaking in baths/hot tubs/pools for 1 week.     You have an appointment set up with the Atrial Fibrillation Clinic.  Multiple studies have shown that being followed by a dedicated atrial fibrillation clinic in addition to the standard care you receive from your other physicians improves health. We believe that enrollment in the atrial fibrillation clinic will allow [place] to better care for you.   The phone number to the Atrial Fibrillation Clinic is [phone]. The clinic is staffed Monday through Friday from 8:30am to 5pm.  Parking Directions: The clinic is located in the Heart and Vascular Building connected to [facility]. 1)From [address] turn on to [address] and go to the 3rd entrance  (Heart and Vascular entrance) on the right. 2)Look to the right for Heart &Vascular Parking Garage. 3)A code for the entrance is required, for Sept is 4455.   4)Take the elevators to the 1st floor. Registration is in the room with the glass walls at the end of the hallway.  If you have any trouble parking or locating the clinic, please don't hesitate to call [phone]. Cardiac Ablation, Care After  This sheet gives you information about how to care for yourself after your procedure. Your health care provider may also give you more specific instructions. If you have problems or questions, contact your health care provider. What can I expect after the procedure? After the procedure, it is common to have: Bruising around your puncture site. Tenderness around your puncture site. Skipped heartbeats. Tiredness (fatigue).  Follow these instructions at home: Puncture  site care  Follow instructions from your health care provider about how to take care of your puncture site. Make sure you: If present, leave stitches (sutures), skin glue, or adhesive strips in place. These skin closures may need to stay in place for up to 2 weeks. If adhesive strip edges start to loosen and curl up, you may trim the loose edges. Do not remove adhesive strips completely unless your health care provider tells you to do that. If a large square bandage is present, this may be removed 24 hours after surgery.  Check your puncture site every day for signs of infection. Check for: Redness, swelling, or pain. Fluid or blood. If your puncture site starts to bleed, lie down on your back, apply firm pressure to the area, and contact your health care provider. Warmth. Pus or a bad smell. Driving Do not drive for at least 4 days after your procedure or however long your health care provider recommends. (Do not resume driving if you have previously been instructed not to drive for other health reasons.) Do not drive or use heavy machinery while taking prescription pain medicine. Activity Avoid activities that take a lot of effort for at least 7 days after your procedure. Do not lift anything that is heavier than 5 lb (4.5 kg) for one week.  No sexual activity for 1 week.  Return to your normal activities as told by your health care provider. Ask your health care provider what activities are safe for you. General instructions Take over-the-counter and prescription medicines only as told by your health care provider.  Do not use any products that contain nicotine or tobacco, such as cigarettes and e-cigarettes. If you need help quitting, ask your health care provider. You may shower after 24 hours, but Do not take baths, swim, or use a hot tub for 1 week.  Do not drink alcohol for 24 hours after your procedure. Keep all follow-up visits as told by your health care provider. This is  important. Contact a health care provider if: You have redness, mild swelling, or pain around your puncture site. You have fluid or blood coming from your puncture site that stops after applying firm pressure to the area. Your puncture site feels warm to the touch. You have pus or a bad smell coming from your puncture site. You have a fever. You have chest pain or discomfort that spreads to your neck, jaw, or arm. You are sweating a lot. You feel nauseous. You have a fast or irregular heartbeat. You have shortness of breath. You are dizzy or light-headed and feel the need to lie down. You have pain or numbness in the arm or leg closest to your puncture site. Get help right away if: Your puncture site suddenly swells. Your puncture site is bleeding and the bleeding does not stop after applying firm pressure to the area. These symptoms may represent a serious problem that is an emergency. Do not wait to see if the symptoms will go away. Get medical help right away. Call your local emergency services (911 in the [place] Do not drive yourself to the hospital. Summary After the procedure, it is normal to have bruising and tenderness at the puncture site in your groin, neck, or forearm. Check your puncture site every day for signs of infection. Get help right away if your puncture site is bleeding and the bleeding does not stop after applying firm pressure to the area. This is a medical emergency. This information is not intended to replace advice given to you by your health care provider. Make sure you discuss any questions you have with your health care provider.

## 2021-07-17 NOTE — Progress Notes (Signed)
Pt ambulated without difficulty or bleeding.   Discharged home with his wife who will drive and stay with pt x 24 hrs. 

## 2021-07-17 NOTE — Anesthesia Procedure Notes (Addendum)
Procedure Name: Intubation Date/Time: 07/17/2021 10:15 AM Performed by: Lavell Luster, CRNA Pre-anesthesia Checklist: Patient identified, Emergency Drugs available, Suction available, Patient being monitored and Timeout performed Patient Re-evaluated:Patient Re-evaluated prior to induction Oxygen Delivery Method: Circle system utilized Preoxygenation: Pre-oxygenation with 100% oxygen Induction Type: IV induction Ventilation: Mask ventilation without difficulty Laryngoscope Size: Mac and 4 Grade View: Grade I Tube type: Oral Tube size: 7.5 mm Number of attempts: 1 Airway Equipment and Method: Stylet Placement Confirmation: ETT inserted through vocal cords under direct vision, positive ETCO2 and breath sounds checked- equal and bilateral Secured at: 22 cm Tube secured with: Tape Dental Injury: Teeth and Oropharynx as per pre-operative assessment

## 2021-07-17 NOTE — Anesthesia Preprocedure Evaluation (Signed)
Anesthesia Evaluation  Patient identified by MRN, date of birth, ID band Patient awake    Reviewed: NPO status , Patient's Chart, lab work & pertinent test results  Airway Mallampati: II  TM Distance: >3 FB Neck ROM: Full    Dental  (+) Teeth Intact   Pulmonary former smoker, PE   Pulmonary exam normal        Cardiovascular Pt. on home beta blockers +CHF and + DVT  + dysrhythmias Atrial Fibrillation  Rhythm:Irregular Rate:Normal     Neuro/Psych negative neurological ROS  negative psych ROS   GI/Hepatic Neg liver ROS, GERD  ,  Endo/Other  negative endocrine ROS  Renal/GU negative Renal ROS  negative genitourinary   Musculoskeletal negative musculoskeletal ROS (+)   Abdominal (+)  Abdomen: soft. Bowel sounds: normal.  Peds  Hematology negative hematology ROS (+)   Anesthesia Other Findings   Reproductive/Obstetrics                             Anesthesia Physical Anesthesia Plan  ASA: 3  Anesthesia Plan: General   Post-op Pain Management:    Induction: Intravenous  PONV Risk Score and Plan: 2 and Propofol infusion, Treatment may vary due to age or medical condition, Ondansetron and Dexamethasone  Airway Management Planned: Mask and Oral ETT  Additional Equipment: None  Intra-op Plan:   Post-operative Plan: Extubation in OR  Informed Consent: I have reviewed the patients History and Physical, chart, labs and discussed the procedure including the risks, benefits and alternatives for the proposed anesthesia with the patient or authorized representative who has indicated his/her understanding and acceptance.     Dental advisory given  Plan Discussed with: CRNA  Anesthesia Plan Comments: (Lab Results      Component                Value               Date                      WBC                      5.0                 07/01/2021                HGB                       14.9                07/01/2021                HCT                      43.7                07/01/2021                MCV                      91                  07/01/2021                PLT  237                 07/01/2021           Lab Results      Component                Value               Date                      NA                       139                 07/01/2021                K                        4.7                 07/01/2021                CO2                      28                  07/01/2021                GLUCOSE                  103 (H)             07/01/2021                BUN                      12                  07/01/2021                CREATININE               0.94                07/01/2021                CALCIUM                  9.0                 07/01/2021                GFRNONAA                 >60                 04/27/2021                GFRAA                    93                  02/05/2020          )        Anesthesia Quick Evaluation

## 2021-07-18 ENCOUNTER — Encounter (HOSPITAL_COMMUNITY): Payer: Self-pay | Admitting: Internal Medicine

## 2021-07-18 ENCOUNTER — Other Ambulatory Visit: Payer: Self-pay | Admitting: Internal Medicine

## 2021-07-18 NOTE — Anesthesia Postprocedure Evaluation (Signed)
Anesthesia Post Note  Patient: Darin Garcia  Procedure(s) Performed: ATRIAL FIBRILLATION ABLATION     Patient location during evaluation: PACU Anesthesia Type: General Level of consciousness: awake and alert Pain management: pain level controlled Vital Signs Assessment: post-procedure vital signs reviewed and stable Respiratory status: spontaneous breathing, nonlabored ventilation, respiratory function stable and patient connected to nasal cannula oxygen Cardiovascular status: blood pressure returned to baseline and stable Postop Assessment: no apparent nausea or vomiting Anesthetic complications: no   No notable events documented.  Last Vitals:  Vitals:   07/17/21 1520 07/17/21 1545  BP: 130/67 135/75  Pulse: 62 64  Resp: 14 14  Temp:    SpO2: 98% 97%    Last Pain:  Vitals:   07/17/21 1329  TempSrc:   PainSc: 0-No pain                 Earl Lites P Erhardt Dada

## 2021-08-08 ENCOUNTER — Other Ambulatory Visit (HOSPITAL_COMMUNITY): Payer: Self-pay | Admitting: Physician Assistant

## 2021-08-13 NOTE — Progress Notes (Signed)
Primary Care Physician: Cheron Schaumann., MD Primary Cardiologist: Dr Anne Fu Primary Electrophysiologist: Dr Johney Frame Referring Physician: Dr Margaretha Seeds is a 60 y.o. male with a history of typical atrial flutter s/p ablation, recurrent DVT and pulmonary embolism, cardiomyopathy (EF is 45-50% in the setting of rapidly conducting atrial flutter) who presents for follow up in the [facility]. Patient has a CHADS2VASC score of 1. He is s/p flutter ablation x 2 in 2020. Patient was seen at the ED 04/27/21 with tachypalpitations and dizziness. ECG showed rapid afib on arrival but he converted to SR spontaneously. He had another episode 04/30/21 which lasted for several hours. There were no specific triggers that he could identify.   On follow up today, patient is s/p afib ablation 07/17/21. He reports that he has done well since the procedure. He denies any heart racing or palpitations. He denies CP, swallowing pain, or groin issues. No bleeding issues on anticoagulation.   Today, he denies symptoms of palpitations, chest pain, orthopnea, PND, lower extremity edema, presyncope, syncope, daytime somnolence, bleeding, or neurologic sequela. The patient is tolerating medications without difficulties and is otherwise without complaint today.    Atrial Fibrillation Risk Factors:  he does not have symptoms or diagnosis of sleep apnea. Negative sleep study. he does not have a history of rheumatic fever. he does not have a history of alcohol use.   he has a BMI of Body mass index is 30.82 kg/m.[name] Filed Weights   08/14/21 0834  Weight: 111.9 kg      Family History  Problem Relation Age of Onset   Pulmonary embolism Father    CAD Sister      Atrial Fibrillation Management history:  Previous antiarrhythmic drugs: none Previous cardioversions: 11/25/18 Previous ablations: 04/04/19 flutter, 08/15/19 flutter, 07/17/21 CHADS2VASC score: 1 Anticoagulation  history: Xarelto (has been on 10 mg for h/o DVT and PE)   Past Medical History:  Diagnosis Date   Atrial flutter (HCC)    a. s/p TEE DCCV 11/2018.   Cardiomyopathy (HCC)    Dyspnea    GERD (gastroesophageal reflux disease)    History of DVT (deep vein thrombosis)    Pulmonary embolism (HCC)    Past Surgical History:  Procedure Laterality Date   A-FLUTTER ABLATION N/A 04/04/2019   Procedure: A-FLUTTER ABLATION;  Surgeon: Hillis Range, MD;  Location: [facility];  Service: Cardiovascular;  Laterality: N/A;   A-FLUTTER ABLATION N/A 08/15/2019   Procedure: A-FLUTTER ABLATION;  Surgeon: Hillis Range, MD;  Location: [facility];  Service: Cardiovascular;  Laterality: N/A;   ATRIAL FIBRILLATION ABLATION N/A 07/17/2021   Procedure: ATRIAL FIBRILLATION ABLATION;  Surgeon: Hillis Range, MD;  Location: [facility];  Service: Cardiovascular;  Laterality: N/A;   ATRIAL FLUTTER ABLATION  08/15/2019   CARDIOVERSION N/A 11/25/2018   Procedure: CARDIOVERSION;  Surgeon: Lewayne Bunting, MD;  Location: [facility] ENDOSCOPY;  Service: Cardiovascular;  Laterality: N/A;   TEE WITHOUT CARDIOVERSION N/A 11/25/2018   Procedure: TRANSESOPHAGEAL ECHOCARDIOGRAM (TEE);  Surgeon: Lewayne Bunting, MD;  Location: [facility] ENDOSCOPY;  Service: Cardiovascular;  Laterality: N/A;   TEE WITHOUT CARDIOVERSION  08/15/2019   TEE WITHOUT CARDIOVERSION N/A 08/15/2019   Procedure: TRANSESOPHAGEAL ECHOCARDIOGRAM (TEE);  Surgeon: Jake Bathe, MD;  Location: [facility] ENDOSCOPY;  Service: Cardiovascular;  Laterality: N/A;    Current Outpatient Medications  Medication Sig Dispense Refill   metoprolol tartrate (LOPRESSOR) 25 MG tablet Take 1 tablet by mouth every 6  hours as needed for breakthrough afib 30 tablet 1   XARELTO 20 MG TABS tablet TAKE 1 TABLET(20 MG) BY MOUTH DAILY 30 tablet 3   No current facility-administered medications for this encounter.    No Known Allergies  Social History   Socioeconomic History    Marital status: Married    Spouse name: michelle   Number of children: Not on file   Years of education: Not on file   Highest education level: Not on file  Occupational History    Employer: [employer]  Tobacco Use   Smoking status: Former    Packs/day: 1.00    Years: 4.00    Pack years: 4.00    Types: Cigarettes    Quit date: 11/23/1993    Years since quitting: 27.7   Smokeless tobacco: Never   Tobacco comments:    Former smoker (08/14/2021)  Vaping Use   Vaping Use: Never used  Substance and Sexual Activity   Alcohol use: Yes    Comment: occ   Drug use: Never   Sexual activity: Not on file  Other Topics Concern   Not on file  Social History Narrative   He is originally from [place].  He moved to [place] 3 years ago.  He is married.  He has 2 children (23 and 18).  He works in Airline pilot for [employer].   Social Determinants of Health   Financial Resource Strain: Not on file  Food Insecurity: Not on file  Transportation Needs: Not on file  Physical Activity: Not on file  Stress: Not on file  Social Connections: Not on file  Intimate Partner Violence: Not on file     ROS- All systems are reviewed and negative except as per the HPI above.  Physical Exam: Vitals:   08/14/21 0834  BP: 112/70  Pulse: (!) 47  Weight: 111.9 kg  Height: 6\' 3"  (1.905 m)    GEN- The patient is a well appearing male, alert and oriented x 3 today.   HEENT-head normocephalic, atraumatic, sclera clear, conjunctiva pink, hearing intact, trachea midline. Lungs- Clear to ausculation bilaterally, normal work of breathing Heart- Regular rate and rhythm, bradycardia, no murmurs, rubs or gallops  GI- soft, NT, ND, + BS Extremities- no clubbing, cyanosis, or edema MS- no significant deformity or atrophy Skin- no rash or lesion Psych- euthymic mood, full affect Neuro- strength and sensation are intact   Wt Readings from Last 3 Encounters:  08/14/21 111.9 kg  07/17/21 108.9 kg  05/28/21  116.1 kg    EKG today demonstrates  SB Vent. rate 47 BPM PR interval 178 ms QRS duration 80 ms QT/QTcB 442/391 ms  Echo 12/28/19 demonstrated   1. Left ventricular ejection fraction, by visual estimation, is 60 to  65%. The left ventricle has normal function. There is mildly increased  left ventricular hypertrophy.   2. Left ventricular diastolic parameters are consistent with Grade I  diastolic dysfunction (impaired relaxation).   3. The left ventricle has no regional wall motion abnormalities.   4. Global right ventricle has normal systolic function.The right  ventricular size is normal. No increase in right ventricular wall  thickness.   5. Left atrial size was normal.   6. Right atrial size was normal.   7. The mitral valve is normal in structure. No evidence of mitral valve  regurgitation. No evidence of mitral stenosis.   8. The tricuspid valve is normal in structure.   9. The tricuspid valve is normal in structure. Tricuspid valve  regurgitation is mild.  10. The aortic valve is normal in structure. Aortic valve regurgitation is  not visualized. No evidence of aortic valve sclerosis or stenosis.  11. The pulmonic valve was normal in structure. Pulmonic valve  regurgitation is not visualized.  12. Normal pulmonary artery systolic pressure.  13. The inferior vena cava is normal in size with greater than 50%  respiratory variability, suggesting right atrial pressure of 3 mmHg.   Epic records are reviewed at length today  Assessment and Plan:  1. Paroxysmal atrial fibrillation/Atrial flutter S/p typical flutter ablation 04/04/19 with repeat ablation 08/15/19. Now s/p afib ablation 07/17/21 Patient appears to be maintaining SR. Continue Lopressor 25 mg q6 hours PRN for heart racing.  Continue Xarelto to 20 mg daily with no missed doses for 3 months post ablation.  (Previously on Xarelto 10 mg daily for prior PE/DVT)  This patients CHA2DS2-VASc Score and unadjusted Ischemic  Stroke Rate (% per year) is equal to 0.6 % stroke rate/year from a score of 1  Above score calculated as 1 point each if present [CHF, HTN, DM, Vascular=MI/PAD/Aortic Plaque, Age if 65-74, or Male] Above score calculated as 2 points each if present [Age > 75, or Stroke/TIA/TE]  2. Obesity Body mass index is 30.82 kg/m. Lifestyle modification was discussed and encouraged including regular physical activity and weight reduction.  3. NICM Suspected tachycardia mediated.  EF normalized with SR. No signs or symptoms of fluid overload.  4. Coronary calcification Noted on CT with CAC 240, 85th percentile  FFR negative. CV score would now be 2, may need to stay on higher dose of Xarelto.   Follow up with Dr Johney Frame as scheduled.    Jorja Loa PA-C [facility] [address] [place], [place] [address] [phone] 08/14/2021 8:41 AM

## 2021-08-14 ENCOUNTER — Encounter (HOSPITAL_COMMUNITY): Payer: Self-pay | Admitting: Physician Assistant

## 2021-08-14 ENCOUNTER — Ambulatory Visit (HOSPITAL_COMMUNITY)
Admission: RE | Admit: 2021-08-14 | Discharge: 2021-08-14 | Disposition: A | Discharge status: Home / Self Care | Payer: Managed Care, Other (non HMO) | Source: Ambulatory Visit | Attending: Physician Assistant | Admitting: Physician Assistant

## 2021-08-14 ENCOUNTER — Other Ambulatory Visit: Payer: Self-pay

## 2021-08-14 VITALS — BP 112/70 | HR 47 | Ht 75.0 in | Wt 246.6 lb

## 2021-08-14 DIAGNOSIS — Z8249 Family history of ischemic heart disease and other diseases of the circulatory system: Secondary | ICD-10-CM | POA: Insufficient documentation

## 2021-08-14 DIAGNOSIS — Z6830 Body mass index (BMI) 30.0-30.9, adult: Secondary | ICD-10-CM | POA: Diagnosis not present

## 2021-08-14 DIAGNOSIS — Z7901 Long term (current) use of anticoagulants: Secondary | ICD-10-CM | POA: Diagnosis not present

## 2021-08-14 DIAGNOSIS — Z86711 Personal history of pulmonary embolism: Secondary | ICD-10-CM | POA: Diagnosis not present

## 2021-08-14 DIAGNOSIS — Z86718 Personal history of other venous thrombosis and embolism: Secondary | ICD-10-CM | POA: Diagnosis not present

## 2021-08-14 DIAGNOSIS — I429 Cardiomyopathy, unspecified: Secondary | ICD-10-CM | POA: Diagnosis not present

## 2021-08-14 DIAGNOSIS — E669 Obesity, unspecified: Secondary | ICD-10-CM | POA: Diagnosis not present

## 2021-08-14 DIAGNOSIS — Z09 Encounter for follow-up examination after completed treatment for conditions other than malignant neoplasm: Secondary | ICD-10-CM | POA: Insufficient documentation

## 2021-08-14 DIAGNOSIS — Z79899 Other long term (current) drug therapy: Secondary | ICD-10-CM | POA: Insufficient documentation

## 2021-08-14 DIAGNOSIS — I480 Paroxysmal atrial fibrillation: Secondary | ICD-10-CM | POA: Diagnosis not present

## 2021-08-14 DIAGNOSIS — Z87891 Personal history of nicotine dependence: Secondary | ICD-10-CM | POA: Insufficient documentation

## 2021-08-14 DIAGNOSIS — I4892 Unspecified atrial flutter: Secondary | ICD-10-CM | POA: Insufficient documentation

## 2021-08-14 MED ORDER — RIVAROXABAN 20 MG PO TABS: ORAL_TABLET | ORAL | 6 refills | Status: DC

## 2021-10-15 ENCOUNTER — Other Ambulatory Visit: Payer: Self-pay

## 2021-10-15 ENCOUNTER — Ambulatory Visit (INDEPENDENT_AMBULATORY_CARE_PROVIDER_SITE_OTHER): Payer: Managed Care, Other (non HMO) | Admitting: Internal Medicine

## 2021-10-15 VITALS — BP 128/80 | HR 58 | Ht 75.0 in | Wt 252.0 lb

## 2021-10-15 DIAGNOSIS — Z86718 Personal history of other venous thrombosis and embolism: Secondary | ICD-10-CM | POA: Diagnosis not present

## 2021-10-15 DIAGNOSIS — I43 Cardiomyopathy in diseases classified elsewhere: Secondary | ICD-10-CM

## 2021-10-15 DIAGNOSIS — R000 Tachycardia, unspecified: Secondary | ICD-10-CM

## 2021-10-15 DIAGNOSIS — I480 Paroxysmal atrial fibrillation: Secondary | ICD-10-CM | POA: Diagnosis not present

## 2021-10-15 MED ORDER — RIVAROXABAN 10 MG PO TABS: 10.0000 mg | ORAL_TABLET | Freq: Every day | ORAL | 3 refills | Status: DC

## 2021-10-15 NOTE — Patient Instructions (Addendum)
Medication Instructions:  Reduce Xarelto to 10 mg daily Your physician recommends that you continue on your current medications as directed. Please refer to the Current Medication list given to you today. *If you need a refill on your cardiac medications before your next appointment, please call your pharmacy*  Lab Work: None. If you have labs (blood work) drawn today and your tests are completely normal, you will receive your results only by: MyChart Message (if you have MyChart) OR A paper copy in the mail If you have any lab test that is abnormal or we need to change your treatment, we will call you to review the results.  Testing/Procedures: None.  Follow-Up: At [facility], you and your health needs are our priority.  As part of our continuing mission to provide you with exceptional heart care, we have created designated Provider Care Teams.  These Care Teams include your primary Cardiologist (physician) and Advanced Practice Providers (APPs -  Physician Assistants and Nurse Practitioners) who all work together to provide you with the care you need, when you need it.  Your physician wants you to follow-up in: 4 months with the Afib Clinic. They will contact you to schedule.     You will receive a reminder letter in the mail two months in advance. If you don't receive a letter, please call our office to schedule the follow-up appointment.  We recommend signing up for the patient portal called "MyChart".  Sign up information is provided on this After Visit Summary.  MyChart is used to connect with patients for Virtual Visits (Telemedicine).  Patients are able to view lab/test results, encounter notes, upcoming appointments, etc.  Non-urgent messages can be sent to your provider as well.   To learn more about what you can do with MyChart, go to [email or web address].    Any Other Special Instructions Will Be Listed Below (If Applicable).

## 2021-10-15 NOTE — Progress Notes (Signed)
   PCP: Cheron Schaumann., MD Primary Cardiologist: Dr Miki Kins is a 60 y.o. male who presents today for routine electrophysiology followup.  Since his recent afib ablation, the patient reports doing very well.  he denies procedure related complications and is pleased with the results of the procedure.  Today, he denies symptoms of palpitations, chest pain, shortness of breath,  lower extremity edema, dizziness, presyncope, or syncope.  The patient is otherwise without complaint today.   Past Medical History:  Diagnosis Date   Atrial flutter (HCC)    a. s/p TEE DCCV 11/2018.   Cardiomyopathy (HCC)    Dyspnea    GERD (gastroesophageal reflux disease)    History of DVT (deep vein thrombosis)    Pulmonary embolism (HCC)    Past Surgical History:  Procedure Laterality Date   A-FLUTTER ABLATION N/A 04/04/2019   Procedure: A-FLUTTER ABLATION;  Surgeon: Hillis Range, MD;  Location: [facility];  Service: Cardiovascular;  Laterality: N/A;   A-FLUTTER ABLATION N/A 08/15/2019   Procedure: A-FLUTTER ABLATION;  Surgeon: Hillis Range, MD;  Location: [facility];  Service: Cardiovascular;  Laterality: N/A;   ATRIAL FIBRILLATION ABLATION N/A 07/17/2021   Procedure: ATRIAL FIBRILLATION ABLATION;  Surgeon: Hillis Range, MD;  Location: [facility];  Service: Cardiovascular;  Laterality: N/A;   ATRIAL FLUTTER ABLATION  08/15/2019   CARDIOVERSION N/A 11/25/2018   Procedure: CARDIOVERSION;  Surgeon: Lewayne Bunting, MD;  Location: [facility] ENDOSCOPY;  Service: Cardiovascular;  Laterality: N/A;   TEE WITHOUT CARDIOVERSION N/A 11/25/2018   Procedure: TRANSESOPHAGEAL ECHOCARDIOGRAM (TEE);  Surgeon: Lewayne Bunting, MD;  Location: [facility] ENDOSCOPY;  Service: Cardiovascular;  Laterality: N/A;   TEE WITHOUT CARDIOVERSION  08/15/2019   TEE WITHOUT CARDIOVERSION N/A 08/15/2019   Procedure: TRANSESOPHAGEAL ECHOCARDIOGRAM (TEE);  Surgeon: Jake Bathe, MD;  Location: [facility] ENDOSCOPY;   Service: Cardiovascular;  Laterality: N/A;    ROS- all systems are personally reviewed and negatives except as per HPI above  Current Outpatient Medications  Medication Sig Dispense Refill   metoprolol tartrate (LOPRESSOR) 25 MG tablet Take 1 tablet by mouth every 6 hours as needed for breakthrough afib 30 tablet 1   rivaroxaban (XARELTO) 20 MG TABS tablet TAKE 1 TABLET(20 MG) BY MOUTH DAILY 30 tablet 6   No current facility-administered medications for this visit.    Physical Exam: Vitals:   10/15/21 0831  BP: 128/80  Pulse: (!) 58  SpO2: 98%  Weight: 252 lb (114.3 kg)  Height: 6\' 3"  (1.905 m)    GEN- The patient is well appearing, alert and oriented x 3 today.   Head- normocephalic, atraumatic Eyes-  Sclera clear, conjunctiva pink Ears- hearing intact Oropharynx- clear Lungs- Clear to ausculation bilaterally, normal work of breathing Heart- Regular rate and rhythm, no murmurs, rubs or gallops, PMI not laterally displaced GI- soft, NT, ND, + BS Extremities- no clubbing, cyanosis, or edema  EKG tracing ordered today is personally reviewed and shows low atrial rhythm  Assessment and Plan:  1. Paroxysmal atrial fibrillation Doing well s/p ablation chads2vasc score is 1 Continue xarelto long term given prior DVT.  2. Prior DVT Continue xarelto 10mg  daily  3. Tachycardia mediated CM Resolved with sinus  4. SOB/ CP Resolved CT with FFR reviewed  Follow-up with Dr  Return to AF clinic in 4 months  MD, [facility] 10/15/2021 8:41 AM

## 2021-10-17 IMAGING — CT CT HEART MORP W/ CTA COR W/ SCORE W/ CA W/CM &/OR W/O CM
3 of 8 series · 6 of 20 positions shown, 7 images · IV contrast (APPLIED)
Comparison: 04/27/2021 chest radiograph.  CTA 12/03/2018.
COMPARISON: 04/27/2021 chest radiograph.  CTA 12/03/2018.

Addendum:
EXAM:
OVER-READ INTERPRETATION  CT CHEST

The following report is an over-read performed by radiologist Dr.
Bhurut Stmart [REDACTED] on 07/10/2021. This over-read
does not include interpretation of cardiac or coronary anatomy or
pathology. The coronary CTA interpretation by the cardiologist is
attached.
CLINICAL DATA: Chest pain
Cardiac CTA
MEDICATIONS:
Sub lingual nitro. 4mg x 2
TECHNIQUE: The patient was scanned on a Siemens [REDACTED]ice scanner. Gantry
rotation speed was 250 msecs. Collimation was 0.6 mm. A 100 kV
prospective scan was triggered in the ascending thoracic aorta at
35-75% of the R-R interval. Average HR during the scan was 60 bpm.
The 3D data set was interpreted on a dedicated work station using
MPR, MIP and VRT modes. A total of 80cc of contrast was used.

[Series 7: best diast 76 % · axial · 0.39mm/px · z∈[+1210,+1261]mm · 2 of 378 slices shown, 3 images]
[im 126/378  vessel]
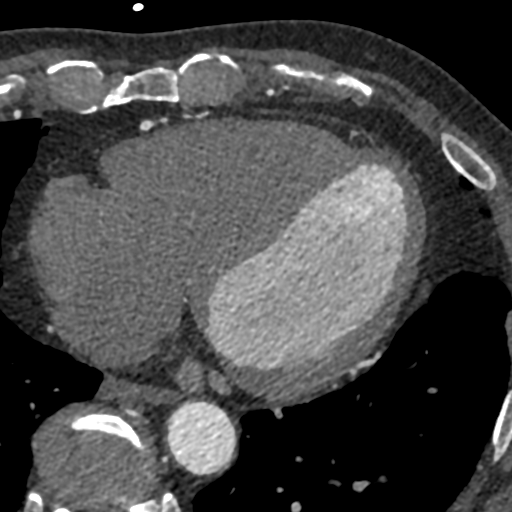
[im 126/378  lung]
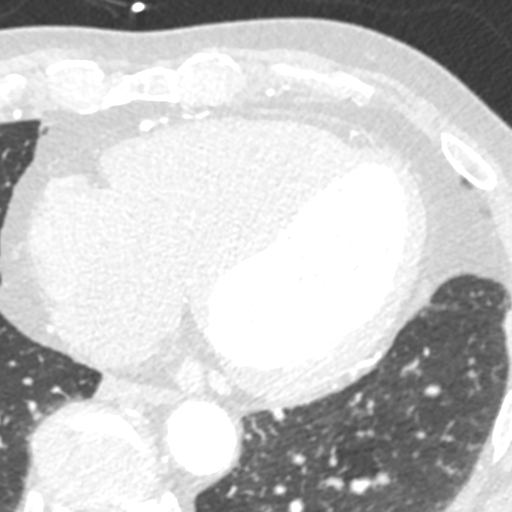
[im 252/378  vessel]
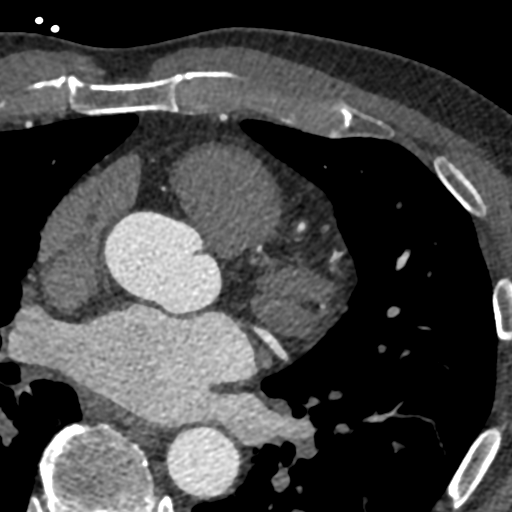

[Series 8: best syst 37 % · axial · 0.39mm/px · z∈[+1210,+1261]mm · 2 of 378 slices shown]
[im 126/378  vessel]
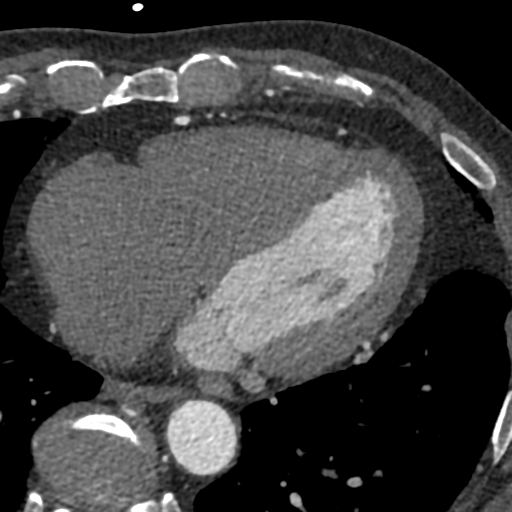
[im 252/378  vessel]
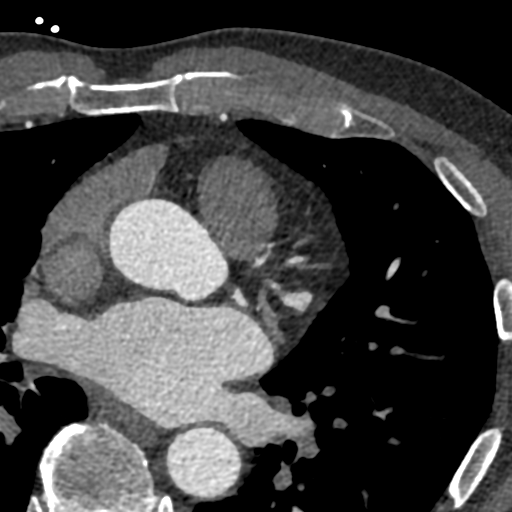

[Series 9: ts syst sharp 37 % · axial · 0.39mm/px · z∈[+1210,+1261]mm · 2 of 378 slices shown]
[im 126/378  lung]
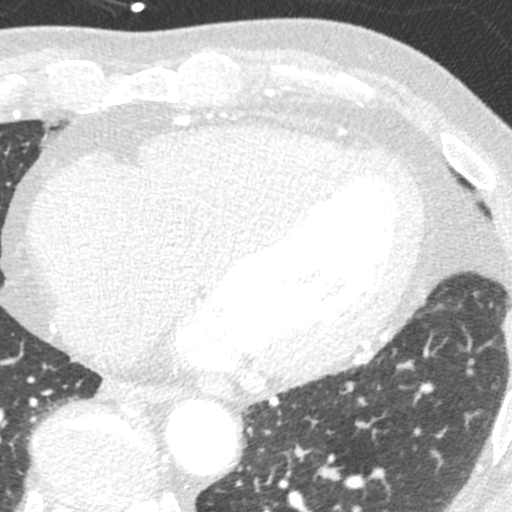
[im 252/378  lung]
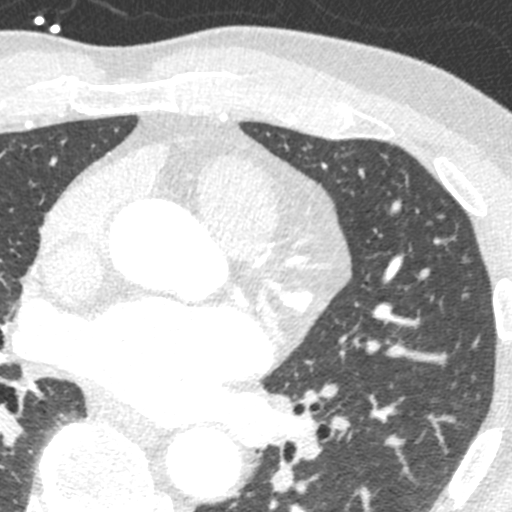

[6 of 20 positions shown; findings below may reference images not displayed]

FINDINGS: Vascular: Aortic atherosclerosis.

Mediastinum/Nodes: No imaged thoracic adenopathy.

Lungs/Pleura: No pleural fluid.  Lingular scarring.

Upper Abdomen: Normal imaged portions of the liver, spleen, stomach.

Musculoskeletal: No acute osseous abnormality.
IMPRESSION: No acute findings in the imaged extracardiac chest.

Aortic Atherosclerosis (E4A9H-4TR.R).
FINDINGS: Non-cardiac: See separate report from [REDACTED].

No left atrial appendage thrombus. Pulmonary veins drained normally
to the left atrium.

Calcium Score: 240 Agatston units.

Coronary Arteries: Right dominant with no anomalies

LM: Mixed plaque, <50% stenosis.

LAD system: Extensive mixed plaque in the proximal LAD, possible
moderate (50-69%) stenosis.

Circumflex system: Mixed plaque proximal LCx, minimal stenosis.

RCA system: No plaque or stenosis.
IMPRESSION: 1. Coronary artery calcium score 240 Agatston units. This places the
patient in the 85th percentile for age and gender, suggesting high
risk for future cardiac events.

2. Possible moderate proximal LAD stenosis. Will send for FFR to
assess hemodynamic significance.

Maksim Wimbush

*** End of Addendum ***
EXAM:
OVER-READ INTERPRETATION  CT CHEST

The following report is an over-read performed by radiologist Dr.
Bhurut Stmart [REDACTED] on 07/10/2021. This over-read
does not include interpretation of cardiac or coronary anatomy or
pathology. The coronary CTA interpretation by the cardiologist is
attached.
FINDINGS: Vascular: Aortic atherosclerosis.

Mediastinum/Nodes: No imaged thoracic adenopathy.

Lungs/Pleura: No pleural fluid.  Lingular scarring.

Upper Abdomen: Normal imaged portions of the liver, spleen, stomach.

Musculoskeletal: No acute osseous abnormality.
IMPRESSION: No acute findings in the imaged extracardiac chest.

Aortic Atherosclerosis (E4A9H-4TR.R).

## 2022-01-26 ENCOUNTER — Institutional Professional Consult (permissible substitution): Payer: Managed Care, Other (non HMO) | Admitting: Internal Medicine

## 2022-01-26 NOTE — Progress Notes (Deleted)
Subjective:  ?  ? Patient ID: Darin Garcia, male   DOB: 1961/02/24,     MRN: [id number] ? ? ? ? 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 > w/u  Pos for sup phlebitis L calf 03/30/16 > xarelto ? ? ?History of Present Illness  ?05/14/2017 1st [facility] office visit/ Darin Garcia  On no anticoagulants ?Chief Complaint  ?Patient presents with  ? Pulmonary Consult  ?  Self referral. Pt has hx of PE. Pt states he has spells and chest congestion and SOB for the past 4 yrs. He states his symptoms come and go, and today is a good day for him.  He last had symptoms approx 6 wks ago. He has occ noct cough- non prod at this time.   ?reproducible doe x steps ?Walks 3 miles 3 times a week in 30 min some hills  ?Every night coughs x 2 years immediately on supine position  ?Worse cough with voice use or beer drinking  ?Note father and brother may also have dvt/pe but his w/u for hypercoagulability neg in [place] per report  ?rec ?Xarelto 10 mg daily  ?Pantoprazole (protonix) 40 mg   Take  30-60 min before first meal of the day and Pepcid (famotidine)  20 mg one @  bedtime until return to office - this is the best way to tell whether stomach acid is contributing to your problem.   ?GERD diet  ? ? ? ?06/28/2017  f/u ov/Darin Garcia re:   PE/dvt/ uacs better while on gerd rx  ?Chief Complaint  ?Patient presents with  ? Follow-up  ?  Pt had a PFT and is here to find out the results of that. Denies any complaints or concerns.  ? has not started any ex/ leg swelling less on xarelto, not  always taking with food  ?rec ?Restart Pantoprazole (protonix) 40 mg   Take  30-60 min before first meal of the day and Pepcid (famotidine)  20 mg one @  bedtime until return to office - this is the best way to tell whether stomach acid is contributing to your problem.   ?GERD  ?For drainage / throat tickle try take CHLORPHENIRAMINE  4 mg - take one every 4 hours as needed - available over the counter- may cause drowsiness so start with just a bedtime dose or two and see how you tolerate it before trying in daytime   ?To get the most out of exercise, you need to be continuously aware that you are short of breath, but never out of breath, for 30 minutes daily. As you improve, it will actually be easier for you to do the same amount of exercise  in  30 minutes so always push to the level where you are short of breath.  ?Please schedule a follow up visit in 3 months but call sooner if needed  ? ? ? ? ? ?10/05/2017  f/u ov/Darin Garcia re: chronic cough /  Lsup phlebitis s/p PE maint on prophylactic xarelto  ?Chief Complaint  ?Patient presents with  ? Follow-up  ?  Breathing is back to his normal baseline.   ?no more cough/ no sob or significant leg swelling using elastic hose  ?rec ?No change x 6 m ? ? ? ? ?01/14/2018 acute extended ov/Darin Garcia re: cough/ L cp ?Chief Complaint  ?Patient presents with  ? Acute Visit  ?  Called in on 01/12/18 with c/o cough and sinus pain- rx omnicef x 10 days. He is now wheezing and feeling SOB- had to use rescue inhaler.  He also has had night sweats. No fever today.  ? acute onset 01/11/18 bad chills/ facial/frontal ha/ cough with brown mucus rx 01/12/18 with omnicef ?Cp center to slt left of sternum s radiation  worse with coughing esp one day prior to [place] resolved p saba  ?Recurrent pattern of URI /sinusitis x 3 years / not on maint gerd rx ?Chills and sinus pain better x last 24 h prior to OV   - no saba on day of ov  ?rec ?For congested cough best choice over the counter is mucinex dm  take up to 1200 mg every 12 hours as needed with large glass of water  ?Only use your albuterol as a rescue medication ?Any time you start coughing> Try prilosec otc 20mg   Take 30-60 min before first meal of the day and Pepcid ac (famotidine) 20 mg one @  bedtime until cough is completely gone for at least a week without the need for cough suppression ?GERD diet ?  ?  ?    ?  ? ?04/04/2018  f/u ov/Darin Garcia re:  L dvt/ PE 03/2016 on maint rx with xarelto  ?Chief Complaint  ?Patient presents with  ? Follow-up  ?  Cough has resolved. No new co's.   ? Dyspnea:  Not limited by breathing from desired activities  Including aerobic activity  ?Cough: resolved ?Sleep: fine ?SABA use: no need  ?Rec ?Vasc surgery eval  ? ? ? ?01/26/2022  f/u ov/Darin Garcia re: ***   maint on ***  ?No chief complaint on file. ?  ?Dyspnea:  *** ?Cough: *** ?Sleeping: *** ?SABA use: *** ?02: *** ?Covid status:   *** ? ? ?No obvious day to day or daytime variability or assoc excess/ purulent sputum or mucus plugs or hemoptysis or cp or chest tightness, subjective wheeze or overt sinus or hb symptoms.  ? ?*** without nocturnal  or early am exacerbation  of respiratory  c/o's or need for noct saba. Also denies any obvious fluctuation of symptoms with weather or environmental changes or other aggravating or alleviating factors except as outlined above  ? ?No unusual exposure hx or h/o childhood pna/ asthma or knowledge of premature birth. ? ?Current Allergies, Complete Past Medical History, Past Surgical History, Family History, and Social History were reviewed in [employer] record. ? ?ROS  The following are not active complaints unless bolded ?Hoarseness, sore throat, dysphagia, dental problems, itching, sneezing,  nasal congestion or discharge of excess mucus or purulent secretions, ear ache,   fever, chills, sweats, unintended wt loss or wt gain, classically pleuritic or exertional cp,  orthopnea pnd or arm/hand swelling  or leg swelling,  presyncope, palpitations, abdominal pain, anorexia, nausea, vomiting, diarrhea  or change in bowel habits or change in bladder habits, change in stools or change in urine, dysuria, hematuria,  rash, arthralgias, visual complaints, headache, numbness, weakness or ataxia  or problems with walking or coordination,  change in mood or  memory. ?      ? ?No outpatient medications have been marked as taking for the 01/26/22 encounter (Appointment) with Tanda Rockers, MD.  ?    ?   ? ?  ?  ?  ? ? ? ?  ?    ?   ?Objective:  ? Physical Exam ? ?  ?01/26/2022         *** ?04/04/2018       249   ?01/14/18 279 lb 6.4 oz (126.7 kg)  ?10/05/17 273 lb 9.6 oz (124.1 kg)  ?06/28/17 272 lb (123.4 kg)  ?  ? Vital signs reviewed  01/26/2022  - Note at rest 02 sats  ***% on ***  ? ?General appearance:    ***  ? ?  serpinous vericosities both legs L >> R *** ?  ? ?  ?  ?  ?  ?   ?Assessment:  ?   ?  ?   ? ?

## 2022-02-12 ENCOUNTER — Ambulatory Visit (HOSPITAL_COMMUNITY)
Admission: RE | Admit: 2022-02-12 | Discharge: 2022-02-12 | Disposition: A | Discharge status: Home / Self Care | Payer: Managed Care, Other (non HMO) | Source: Ambulatory Visit | Attending: Physician Assistant | Admitting: Physician Assistant

## 2022-02-12 ENCOUNTER — Other Ambulatory Visit: Payer: Self-pay

## 2022-02-12 ENCOUNTER — Encounter (HOSPITAL_COMMUNITY): Payer: Self-pay | Admitting: Physician Assistant

## 2022-02-12 VITALS — BP 126/82 | HR 51 | Ht 75.0 in | Wt 267.0 lb

## 2022-02-12 DIAGNOSIS — Z713 Dietary counseling and surveillance: Secondary | ICD-10-CM | POA: Insufficient documentation

## 2022-02-12 DIAGNOSIS — I2584 Coronary atherosclerosis due to calcified coronary lesion: Secondary | ICD-10-CM | POA: Diagnosis not present

## 2022-02-12 DIAGNOSIS — E669 Obesity, unspecified: Secondary | ICD-10-CM | POA: Insufficient documentation

## 2022-02-12 DIAGNOSIS — Z7901 Long term (current) use of anticoagulants: Secondary | ICD-10-CM | POA: Insufficient documentation

## 2022-02-12 DIAGNOSIS — Z79899 Other long term (current) drug therapy: Secondary | ICD-10-CM | POA: Diagnosis not present

## 2022-02-12 DIAGNOSIS — I428 Other cardiomyopathies: Secondary | ICD-10-CM | POA: Insufficient documentation

## 2022-02-12 DIAGNOSIS — Z6833 Body mass index (BMI) 33.0-33.9, adult: Secondary | ICD-10-CM | POA: Diagnosis not present

## 2022-02-12 DIAGNOSIS — Z86718 Personal history of other venous thrombosis and embolism: Secondary | ICD-10-CM | POA: Diagnosis not present

## 2022-02-12 DIAGNOSIS — Z86711 Personal history of pulmonary embolism: Secondary | ICD-10-CM | POA: Insufficient documentation

## 2022-02-12 DIAGNOSIS — I480 Paroxysmal atrial fibrillation: Secondary | ICD-10-CM

## 2022-02-12 DIAGNOSIS — I4892 Unspecified atrial flutter: Secondary | ICD-10-CM | POA: Insufficient documentation

## 2022-02-12 DIAGNOSIS — Z7182 Exercise counseling: Secondary | ICD-10-CM | POA: Diagnosis not present

## 2022-02-12 NOTE — Progress Notes (Signed)
? ? ?Primary Care Physician: Loraine Leriche., MD ?Primary Cardiologist: Dr Marlou Porch ?Primary Electrophysiologist: Dr Rayann Heman ?Referring Physician: Dr Rayann Heman ? ? ?Darin Garcia is a 61 y.o. male with a history of typical atrial flutter s/p ablation, recurrent DVT and pulmonary embolism, cardiomyopathy (EF is 45-50% in the setting of rapidly conducting atrial flutter) who presents for follow up in the [facility]. Patient has a CHADS2VASC score of 1. He is s/p flutter ablation x 2 in 2020. Patient was seen at the ED 04/27/21 with tachypalpitations and dizziness. ECG showed rapid afib on arrival but he converted to SR spontaneously. He had another episode 04/30/21 which lasted for several hours. There were no specific triggers that he could identify.  ? ?On follow up today, patient is s/p afib ablation 07/17/21. He reports that he has done well since the procedure. He denies any heart racing or palpitations. He denies CP, swallowing pain, or groin issues. No bleeding issues on anticoagulation.  ? ?Today, he denies symptoms of palpitations, chest pain, orthopnea, PND, lower extremity edema, presyncope, syncope, daytime somnolence, bleeding, or neurologic sequela. The patient is tolerating medications without difficulties and is otherwise without complaint today.  ? ? ?Atrial Fibrillation Risk Factors: ? ?he does not have symptoms or diagnosis of sleep apnea. Negative sleep study. ?he does not have a history of rheumatic fever. ?he does not have a history of alcohol use. ? ? ?he has a BMI of Body mass index is 33.37 kg/m?Marland KitchenMarland Kitchen ?Filed Weights  ? 02/12/22 0849  ?Weight: 121.1 kg  ? ? ? ? ?Family History  ?Problem Relation Age of Onset  ? Pulmonary embolism Father   ? CAD Sister   ? ? ? ?Atrial Fibrillation Management history: ? ?Previous antiarrhythmic drugs: none ?Previous cardioversions: 11/25/18 ?Previous ablations: 04/04/19 flutter, 08/15/19 flutter, 07/17/21 ?CHADS2VASC score: 1 ?Anticoagulation  history: Xarelto (has been on 10 mg for h/o DVT and PE) ? ? ?Past Medical History:  ?Diagnosis Date  ? Atrial flutter ([place])   ? a. s/p TEE DCCV 11/2018.  ? Cardiomyopathy ([place])   ? Dyspnea   ? GERD (gastroesophageal reflux disease)   ? History of DVT (deep vein thrombosis)   ? Pulmonary embolism ([place])   ? ?Past Surgical History:  ?Procedure Laterality Date  ? A-FLUTTER ABLATION N/A 04/04/2019  ? Procedure: A-FLUTTER ABLATION;  Surgeon: Thompson Grayer, MD;  Location: [facility];  Service: Cardiovascular;  Laterality: N/A;  ? A-FLUTTER ABLATION N/A 08/15/2019  ? Procedure: A-FLUTTER ABLATION;  Surgeon: Thompson Grayer, MD;  Location: [facility];  Service: Cardiovascular;  Laterality: N/A;  ? ATRIAL FIBRILLATION ABLATION N/A 07/17/2021  ? Procedure: ATRIAL FIBRILLATION ABLATION;  Surgeon: Thompson Grayer, MD;  Location: [facility];  Service: Cardiovascular;  Laterality: N/A;  ? ATRIAL FLUTTER ABLATION  08/15/2019  ? CARDIOVERSION N/A 11/25/2018  ? Procedure: CARDIOVERSION;  Surgeon: Lelon Perla, MD;  Location: [facility];  Service: Cardiovascular;  Laterality: N/A;  ? TEE WITHOUT CARDIOVERSION N/A 11/25/2018  ? Procedure: TRANSESOPHAGEAL ECHOCARDIOGRAM (TEE);  Surgeon: Lelon Perla, MD;  Location: [facility];  Service: Cardiovascular;  Laterality: N/A;  ? TEE WITHOUT CARDIOVERSION  08/15/2019  ? TEE WITHOUT CARDIOVERSION N/A 08/15/2019  ? Procedure: TRANSESOPHAGEAL ECHOCARDIOGRAM (TEE);  Surgeon: Jerline Pain, MD;  Location: [facility] ENDOSCOPY;  Service: Cardiovascular;  Laterality: N/A;  ? ? ?Current Outpatient Medications  ?Medication Sig Dispense Refill  ? metoprolol tartrate (LOPRESSOR) 25 MG tablet Take 1 tablet by mouth every 6  hours as needed for breakthrough afib 30 tablet 1  ? rivaroxaban (XARELTO) 10 MG TABS tablet Take 1 tablet (10 mg total) by mouth daily. 90 tablet 3  ? ?No current facility-administered medications for this encounter.  ? ? ?No Known Allergies ? ?Social History   ? ?Socioeconomic History  ? Marital status: Married  ?  Spouse name: michelle  ? Number of children: Not on file  ? Years of education: Not on file  ? Highest education level: Not on file  ?Occupational History  ?  Employer: [employer]  ?Tobacco Use  ? Smoking status: Former  ?  Packs/day: 1.00  ?  Years: 4.00  ?  Pack years: 4.00  ?  Types: Cigarettes  ?  Quit date: 11/23/1993  ?  Years since quitting: 28.2  ? Smokeless tobacco: Never  ? Tobacco comments:  ?  Former smoker (08/14/2021)  ?Vaping Use  ? Vaping Use: Never used  ?Substance and Sexual Activity  ? Alcohol use: Yes  ?  Comment: only during the hoildays 02/12/22  ? Drug use: Never  ? Sexual activity: Not on file  ?Other Topics Concern  ? Not on file  ?Social History Narrative  ? He is originally from [place].  He moved to [place] 3 years ago.  He is married.  He has 2 children (23 and 18).  He works in Press photographer for [employer].  ? ?Social Determinants of Health  ? ?Financial Resource Strain: Not on file  ?Food Insecurity: Not on file  ?Transportation Needs: Not on file  ?Physical Activity: Not on file  ?Stress: Not on file  ?Social Connections: Not on file  ?Intimate Partner Violence: Not on file  ? ? ? ?ROS- All systems are reviewed and negative except as per the HPI above. ? ?Physical Exam: ?Vitals:  ? 02/12/22 0849  ?BP: 126/82  ?Pulse: (!) 51  ?Weight: 121.1 kg  ?Height: 6\' 3"  (1.905 m)  ? ? ?GEN- The patient is a well appearing male, alert and oriented x 3 today.   ?HEENT-head normocephalic, atraumatic, sclera clear, conjunctiva pink, hearing intact, trachea midline. ?Lungs- Clear to ausculation bilaterally, normal work of breathing ?Heart- Regular rate and rhythm, bradycardia, no murmurs, rubs or gallops  ?GI- soft, NT, ND, + BS ?Extremities- no clubbing, cyanosis, or edema ?MS- no significant deformity or atrophy ?Skin- no rash or lesion ?Psych- euthymic mood, full affect ?Neuro- strength and sensation are intact ? ? ?Wt Readings from Last 3  Encounters:  ?02/12/22 121.1 kg  ?10/15/21 114.3 kg  ?08/14/21 111.9 kg  ? ? ?EKG today demonstrates  ?SB ?Vent. rate 47 BPM ?PR interval 178 ms ?QRS duration 80 ms ?QT/QTcB 442/391 ms ? ?Echo 12/28/19 demonstrated  ? 1. Left ventricular ejection fraction, by visual estimation, is 60 to  ?65%. The left ventricle has normal function. There is mildly increased left ventricular hypertrophy.  ? 2. Left ventricular diastolic parameters are consistent with Grade I  ?diastolic dysfunction (impaired relaxation).  ? 3. The left ventricle has no regional wall motion abnormalities.  ? 4. Global right ventricle has normal systolic function.The right  ?ventricular size is normal. No increase in right ventricular wall  ?thickness.  ? 5. Left atrial size was normal.  ? 6. Right atrial size was normal.  ? 7. The mitral valve is normal in structure. No evidence of mitral valve  ?regurgitation. No evidence of mitral stenosis.  ? 8. The tricuspid valve is normal in structure.  ? 9. The tricuspid valve  is normal in structure. Tricuspid valve  ?regurgitation is mild.  ?10. The aortic valve is normal in structure. Aortic valve regurgitation is not visualized. No evidence of aortic valve sclerosis or stenosis.  ?11. The pulmonic valve was normal in structure. Pulmonic valve  ?regurgitation is not visualized.  ?12. Normal pulmonary artery systolic pressure.  ?13. The inferior vena cava is normal in size with greater than 50%  ?respiratory variability, suggesting right atrial pressure of 3 mmHg.  ? ?Epic records are reviewed at length today ? ? ?CHA2DS2-VASc Score = 1  ?The patient's score is based upon: ?CHF History: 0 (EF recovered) ?HTN History: 0 ?Diabetes History: 0 ?Stroke History: 0 ?Vascular Disease History: 1 (CAC score 240) ?Age Score: 0 ?Gender Score: 0 ?    ? ? ?ASSESSMENT AND PLAN: ?1. Paroxysmal Atrial Fibrillation/atrial flutter ?The patient's CHA2DS2-VASc score is 1, indicating a 0.6% annual risk of stroke.   ?S/p typical  flutter ablation 04/04/19 with repeat ablation 08/15/19. ?Now s/p afib ablation 07/17/21 ?Patient appears to be maintaining SR.  ?Continue Lopressor 25 mg q6 hours PRN for heart racing.  ?Continue Xarelto 10 mg daily (prior D

## 2022-02-18 ENCOUNTER — Institutional Professional Consult (permissible substitution): Payer: Managed Care, Other (non HMO) | Admitting: Internal Medicine

## 2022-02-18 NOTE — Progress Notes (Deleted)
Subjective:  ?  ? Patient ID: Darin Garcia, male   DOB: 02-12-1961,     MRN: [id number] ? ? ? ? 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 > w/u  Pos for sup phlebitis L calf 03/30/16 > xarelto ? ? ?History of Present Illness  ?05/14/2017 1st [facility] office visit/ Darin Garcia  On no anticoagulants ?Chief Complaint  ?Patient presents with  ? Pulmonary Consult  ?  Self referral. Pt has hx of PE. Pt states he has spells and chest congestion and SOB for the past 4 yrs. He states his symptoms come and go, and today is a good day for him.  He last had symptoms approx 6 wks ago. He has occ noct cough- non prod at this time.   ?reproducible doe x steps ?Walks 3 miles 3 times a week in 30 min some hills  ?Every night coughs x 2 years immediately on supine position  ?Worse cough with voice use or beer drinking  ?Note father and brother may also have dvt/pe but his w/u for hypercoagulability neg in [place] per report  ?rec ?Xarelto 10 mg daily  ?Pantoprazole (protonix) 40 mg   Take  30-60 min before first meal of the day and Pepcid (famotidine)  20 mg one @  bedtime until return to office - this is the best way to tell whether stomach acid is contributing to your problem.   ?GERD diet  ? ? ? ?06/28/2017  f/u ov/Darin Garcia re:   PE/dvt/ uacs better while on gerd rx  ?Chief Complaint  ?Patient presents with  ? Follow-up  ?  Pt had a PFT and is here to find out the results of that. Denies any complaints or concerns.  ? has not started any ex/ leg swelling less on xarelto, not  always taking with food  ?rec ?Restart Pantoprazole (protonix) 40 mg   Take  30-60 min before first meal of the day and Pepcid (famotidine)  20 mg one @  bedtime until return to office - this is the best way to tell whether stomach acid is contributing to your problem.   ?GERD diet  ?For drainage / throat tickle try take CHLORPHENIRAMINE  4 mg - take one every 4 hours as needed - available over the counter- may cause drowsiness so start with just a bedtime dose or two and see how you tolerate it before trying in daytime   ?To get the most out of exercise, you need to be continuously aware that you are short of breath, but never out of breath, for 30 minutes daily.   ? ? ? ? ?01/14/2018 acute extended ov/Darin Garcia re: cough/ L cp ?Chief Complaint  ?Patient presents with  ? Acute Visit  ?  Called in on 01/12/18 with c/o cough and sinus pain- rx omnicef x 10 days. He is now wheezing and feeling SOB- had to use rescue inhaler.  He also has had night sweats. No  fever today.  ? acute onset 01/11/18 bad chills/ facial/frontal ha/ cough with brown mucus rx 01/12/18 with omnicef ?Cp center to slt left of sternum s radiation  worse with coughing esp one day prior to [place] resolved p saba  ?Recurrent pattern of URI /sinusitis x 3 years / not on maint gerd rx ?Chills and sinus pain better x last 24 h prior to OV   - no saba on day of ov  ?rec ?For congested cough best choice over the counter is mucinex dm take up to 1200 mg every 12 hours as needed with large glass of water  ?Only use your albuterol as a rescue medication ?Any time you start coughing> Try prilosec otc 20mg   Take 30-60 min before first meal of the day and Pepcid ac (famotidine) 20 mg one @  bedtime until cough is completely gone for at least a week without the need for cough suppression ?GERD diet ?  ?  ?    ?  ? ?04/04/2018  f/u ov/Darin Garcia re:  L dvt/ PE 03/2016 on maint rx with xarelto  ?Chief Complaint  ?Patient presents with  ? Follow-up  ?  Cough has resolved. No  new co's.   ? Dyspnea:  Not limited by breathing from desired activities  Including aerobic activity  ?Cough: resolved ?Sleep: fine ?SABA use: no need  ?Rec ?Vasc surgery eval  ? ? ? ?02/18/2022  f/u ov/Darin Garcia re: ***   maint on ***  ?No chief complaint on file. ?  ?Dyspnea:  *** ?Cough: *** ?Sleeping: *** ?SABA use: *** ?02: *** ?Covid status:   *** ? ? ?No obvious day to day or daytime variability or assoc excess/ purulent sputum or mucus plugs or hemoptysis or cp or chest tightness, subjective wheeze or overt sinus or hb symptoms.  ? ?*** without nocturnal  or early am exacerbation  of respiratory  c/o's or need for noct saba. Also denies any obvious fluctuation of symptoms with weather or environmental changes or other aggravating or alleviating factors except as outlined above  ? ?No unusual exposure hx or h/o childhood pna/ asthma or knowledge of premature birth. ? ?Current Allergies, Complete Past Medical History, Past Surgical History, Family History, and Social History were reviewed in [employer] record. ? ?ROS  The following are not active complaints unless bolded ?Hoarseness, sore throat, dysphagia, dental problems, itching, sneezing,  nasal congestion or discharge of excess mucus or purulent secretions, ear ache,   fever, chills, sweats, unintended wt loss or wt gain, classically pleuritic or exertional cp,  orthopnea pnd or arm/hand swelling  or leg swelling, presyncope, palpitations, abdominal pain, anorexia, nausea, vomiting, diarrhea  or change in bowel habits or change in bladder habits, change in stools or change in urine, dysuria, hematuria,  rash, arthralgias, visual complaints, headache, numbness, weakness or ataxia or problems with walking or coordination,  change in mood or  memory. ?      ? ?No outpatient medications have been marked as taking for the 02/18/22 encounter (Appointment) with Tanda Rockers, MD.  ?    ?   ? ?  ?  ?  ? ? ? ?  ?    ?   ?Objective:  ? Physical  Exam ? ?  ?02/18/2022         *** ?04/04/2018       249   ?01/14/18 279 lb 6.4 oz (126.7 kg)  ?10/05/17 273 lb 9.6 oz (124.1 kg)  ?  06/28/17 272 lb (123.4 kg)  ?  ? Vital signs reviewed  02/18/2022  - Note at rest 02 sats  ***% on ***  ? ?General appearance:    ***  ? ?  serpinous vericosities both legs L >> R *** ?  ? ?  ?  ?  ?  ?   ?Assessment:  ?   ?  ?   ? ?

## 2022-03-05 ENCOUNTER — Ambulatory Visit (INDEPENDENT_AMBULATORY_CARE_PROVIDER_SITE_OTHER): Payer: Managed Care, Other (non HMO) | Admitting: Internal Medicine

## 2022-03-05 ENCOUNTER — Encounter: Payer: Self-pay | Admitting: Internal Medicine

## 2022-03-05 ENCOUNTER — Ambulatory Visit (INDEPENDENT_AMBULATORY_CARE_PROVIDER_SITE_OTHER): Payer: Managed Care, Other (non HMO)

## 2022-03-05 DIAGNOSIS — R0609 Other forms of dyspnea: Secondary | ICD-10-CM

## 2022-03-05 DIAGNOSIS — I825Y2 Chronic embolism and thrombosis of unspecified deep veins of left proximal lower extremity: Secondary | ICD-10-CM

## 2022-03-05 DIAGNOSIS — R058 Other specified cough: Secondary | ICD-10-CM

## 2022-03-05 LAB — CBC WITH DIFFERENTIAL/PLATELET
Basophils Absolute: 0 10*3/uL (ref 0.0–0.1)
Basophils Relative: 0.9 % (ref 0.0–3.0)
Eosinophils Absolute: 0 10*3/uL (ref 0.0–0.7)
Eosinophils Relative: 0.8 % (ref 0.0–5.0)
HCT: 44.5 % (ref 39.0–52.0)
Hemoglobin: 15.4 g/dL (ref 13.0–17.0)
Lymphocytes Relative: 35.6 % (ref 12.0–46.0)
Lymphs Abs: 1.9 10*3/uL (ref 0.7–4.0)
MCHC: 34.6 g/dL (ref 30.0–36.0)
MCV: 90.3 fl (ref 78.0–100.0)
Monocytes Absolute: 0.5 10*3/uL (ref 0.1–1.0)
Monocytes Relative: 9.6 % (ref 3.0–12.0)
Neutro Abs: 2.8 10*3/uL (ref 1.4–7.7)
Neutrophils Relative %: 53.1 % (ref 43.0–77.0)
Platelets: 244 10*3/uL (ref 150.0–400.0)
RBC: 4.94 Mil/uL (ref 4.22–5.81)
RDW: 12.5 % (ref 11.5–15.5)
WBC: 5.2 10*3/uL (ref 4.0–10.5)

## 2022-03-05 LAB — BASIC METABOLIC PANEL
BUN: 10 mg/dL (ref 6–23)
CO2: 27 mEq/L (ref 19–32)
Calcium: 9.1 mg/dL (ref 8.4–10.5)
Chloride: 104 mEq/L (ref 96–112)
Creatinine, Ser: 0.92 mg/dL (ref 0.40–1.50)
GFR: 91.15 mL/min (ref >60.00)
Glucose, Bld: 90 mg/dL (ref 70–99)
Potassium: 4.3 mEq/L (ref 3.5–5.1)
Sodium: 136 mEq/L (ref 135–145)

## 2022-03-05 LAB — TSH: TSH: 2.41 u[IU]/mL (ref 0.35–5.50)

## 2022-03-05 LAB — BRAIN NATRIURETIC PEPTIDE: Pro B Natriuretic peptide (BNP): 67 pg/mL (ref 0.0–100.0)

## 2022-03-05 MED ORDER — PANTOPRAZOLE SODIUM 40 MG PO TBEC: 40.0000 mg | DELAYED_RELEASE_TABLET | Freq: Every day | ORAL | 2 refills | Status: DC

## 2022-03-05 MED ORDER — FAMOTIDINE 20 MG PO TABS: ORAL_TABLET | ORAL | 11 refills | Status: DC

## 2022-03-05 NOTE — Progress Notes (Signed)
Subjective:  ?  ? Patient ID: Darin Garcia, male   DOB: 1961-08-27,     MRN: [id number] ? ? ? ? 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 > w/u  Pos for sup phlebitis L calf 03/30/16 > xarelto ? ? ?History of Present Illness  ?05/14/2017 1st [facility] office visit/ Ankit Degregorio  On no anticoagulants ?Chief Complaint  ?Patient presents with  ? Pulmonary Consult  ?  Self referral. Pt has hx of PE. Pt states he has spells and chest congestion and SOB for the past 4 yrs. He states his symptoms come and go, and today is a good day for him.  He last had symptoms approx 6 wks ago. He has occ noct cough- non prod at this time.   ?reproducible doe x steps ?Walks 3 miles 3 times a week in 30 min some hills  ?Every night coughs x 2 years immediately on supine position  ?Worse cough with voice use or beer drinking  ?Note father and brother may also have dvt/pe but his w/u for hypercoagulability neg in [place] per report  ?rec ?Xarelto 10 mg daily  ?Pantoprazole (protonix) 40 mg   Take  30-60 min before first meal of the day and Pepcid (famotidine)  20 mg one @  bedtime until return to office - this is the best way to tell whether stomach acid is contributing to your problem.   ?GERD diet  ? ? ? ?06/28/2017  f/u ov/Narya Beavin re:   PE/dvt/ uacs better while on gerd rx  ?Chief Complaint  ?Patient presents with  ? Follow-up  ?  Pt had a PFT and is here to find out the results of that. Denies any complaints or concerns.  ? has not started any ex/ leg swelling less on xarelto, not  always taking with food  ?rec ?Restart Pantoprazole (protonix) 40 mg   Take  30-60 min before first meal of the day and Pepcid (famotidine)  20 mg one @  bedtime until return to office - this is the best way to tell whether stomach acid is contributing to your problem.   ?GERD diet  ?For drainage / throat tickle try take CHLORPHENIRAMINE  4 mg - take one every 4 hours as needed - available over the counter- may cause drowsiness so start with just a bedtime dose or two and see how you tolerate it before trying in daytime   ?To get the most out of exercise, you need to be continuously aware that you are short of breath, but never out of breath, for 30 minutes daily.   ? ? ? ? ?01/14/2018 acute extended ov/Razi Hickle re: cough/ L cp ?Chief Complaint  ?Patient presents with  ? Acute Visit  ?  Called in on 01/12/18 with c/o cough and sinus pain- rx omnicef x 10 days. He is now wheezing and feeling SOB- had to use rescue inhaler.  He also has had night sweats. No  fever today.  ? acute onset 01/11/18 bad chills/ facial/frontal ha/ cough with brown mucus rx 01/12/18 with omnicef ?Cp center to slt left of sternum s radiation  worse with coughing esp one day prior to [place] resolved p saba  ?Recurrent pattern of URI /sinusitis x 3 years / not on maint gerd rx ?Chills and sinus pain better x last 24 h prior to OV   - no saba on day of ov  ?rec ?For congested cough best choice over the counter is mucinex dm take up to 1200 mg every 12 hours as needed with large glass of water  ?Only use your albuterol as a rescue medication ?Any time you start coughing> Try prilosec otc 20mg   Take 30-60 min before first meal of the day and Pepcid ac (famotidine) 20 mg one @  bedtime until cough is completely gone for at least a week without the need for cough suppression ?GERD diet ?  ?   ? ?03/05/2022  f/u ov/Peggyann Zwiefelhofer re: cough/ sob maint on prophylactic doses xarelto for PE/DVT and PAF ?Chief Complaint  ?Patient presents with  ? Follow-up  ?  Patient  says he's having issues with shortness of breath.  ?Dyspnea:  onset Feb 2023 assoc with sob at rest talking/ assoc with wt gain/  already seen by afib clinic  ?Can do up 40 min walk s stopping though this is flat at mod pace  ?Cough: 24/7 but worse at hs but settles down overnight  ?Sleeping: most nights wakes up bed is flat / 2 pillows  ?SABA use: none  ?02: none  ?Covid status:   vax x 3  ? ? ?No obvious day to day or daytime variability or assoc excess/ purulent sputum or mucus plugs or hemoptysis or cp or chest tightness, subjective wheeze or overt sinus or hb symptoms.  ? ?  Also denies any obvious fluctuation of symptoms with weather or environmental changes or other aggravating or alleviating factors except as outlined above  ? ?No unusual exposure hx or h/o childhood pna/ asthma or knowledge of premature birth. ? ?Current Allergies, Complete Past Medical History, Past Surgical History, Family History, and Social History were reviewed in [employer] record. ? ?ROS  The following are not active complaints unless bolded ?Hoarseness, sore throat, dysphagia, dental problems, itching, sneezing,  nasal congestion or discharge of excess mucus or purulent secretions, ear ache,   fever, chills, sweats, unintended wt loss or wt gain, classically pleuritic or exertional cp,  orthopnea pnd or arm/hand swelling  or leg swelling esp on L , presyncope, palpitations, abdominal pain, anorexia, nausea, vomiting, diarrhea  or change in bowel habits or change in bladder habits, change in stools or change in urine, dysuria, hematuria,  rash, arthralgias, visual complaints, headache, numbness, weakness or ataxia or problems with walking or coordination,  change in mood or  memory. ?      ? ?Current Meds  ?Medication Sig  ? metoprolol tartrate (LOPRESSOR) 25 MG tablet Take 1 tablet by mouth every 6 hours as needed for breakthrough afib  ? rivaroxaban (XARELTO) 10 MG TABS tablet Take 1 tablet (10 mg total) by  mouth daily.  ?    ?   ?    ?   ?Objective:  ? Physical Exam ? ?  ?03/05/2022       268 ?04/04/2018       249   ?01/14/18 279 lb 6.4 oz (126.7 kg)  ?10/05/17 273 lb 9.6  oz (124.1 kg)  ?06/28/17 272 lb (123.4 kg)  ?  ? Vital signs reviewed  03/05/2022  - Note at rest 02 sats  100% on RA  ? ?General appearance:    amb pleasant wm, occ throat claring   ? ? ? HEENT : oropharynx s excess pnd/no cobblestoning/ nl turbinates  ? ? ?NECK :  without JVD/Nodes/TM/ nl carotid upstrokes bilaterally ? ? ?LUNGS: no acc muscle use,  Nl contour chest which is clear to A and P bilaterally without cough on insp or exp maneuvers ? ? ?CV:  RRR  no s3 or murmur or increase in P2, and no edema  ? ?ABD:  soft and nontender with nl inspiratory excursion in the supine position. No bruits or organomegaly appreciated, bowel sounds nl ? ?MS:  Nl gait/ ext warm without deformities, calf tenderness, cyanosis or clubbing ?No obvious joint restrictions  ? ?SKIN: warm and dry with  serpinous vericosities both legs L >> R ? ?NEURO:  alert, approp, nl sensorium with  no motor or cerebellar deficits apparent.  ?  ?  ?CXR PA and Lateral:   03/05/2022 :    ?I personally reviewed images and agree with radiology impression as follows:   ?No active cardiopulmonary disease ?  ?  ?Labs ordered/ reviewed:  ? ? ?  Chemistry   ?   ?Component Value Date/Time  ? NA 136 03/05/2022 1025  ? NA 139 07/01/2021 0814  ? K 4.3 03/05/2022 1025  ? CL 104 03/05/2022 1025  ? CO2 27 03/05/2022 1025  ? BUN 10 03/05/2022 1025  ? BUN 12 07/01/2021 0814  ? CREATININE 0.92 03/05/2022 1025  ?    ?Component Value Date/Time  ? CALCIUM 9.1 03/05/2022 1025  ? ALKPHOS 50 11/23/2018 2012  ? AST 30 11/23/2018 2012  ? ALT 14 11/23/2018 2012  ? BILITOT 0.7 11/23/2018 2012  ?  ? ?  ? ?Lab Results  ?Component Value Date  ? WBC 5.2 03/05/2022  ? HGB 15.4 03/05/2022  ? HCT 44.5 03/05/2022  ? MCV 90.3 03/05/2022  ? PLT 244.0 03/05/2022  ?  ?  ?No results found for: DDIMER  ?  ?Lab Results  ?Component  Value Date  ? TSH 2.41 03/05/2022  ?  ? ?Lab Results  ?Component Value Date  ? PROBNP 67.0 03/05/2022  ?  ? ?  ?   ?  ?  ?   ?Assessment:  ?   ?  ?   ? ?

## 2022-03-05 NOTE — Patient Instructions (Addendum)
For drainage / throat tickle try take CHLORPHENIRAMINE  4 mg  ("Allergy Relief" 4mg   at [facility] should be easiest to find in the blue box usually on bottom shelf)  take one every 4 hours as needed - extremely effective and inexpensive over the counter- may cause drowsiness so start with just a dose or two an hour before bedtime and see how you tolerate it before trying in daytime.  ? ?Pantoprazole (protonix) 40 mg   Take  30-60 min before first meal of the day and Pepcid (famotidine)  20 mg after supper until return to office - this is the best way to tell whether stomach acid is contributing to your problem.   ? ?GERD (REFLUX)  is an extremely common cause of respiratory symptoms just like yours , many times with no obvious heartburn at all.  ? ? It can be treated with medication, but also with lifestyle changes including elevation of the head of your bed (ideally with 6 -8inch blocks under the headboard of your bed),  Smoking cessation, avoidance of late meals, excessive alcohol, and avoid fatty foods, chocolate, peppermint, colas, red wine, and acidic juices such as orange juice.  ?NO MINT OR MENTHOL PRODUCTS SO NO COUGH DROPS  ?USE SUGARLESS CANDY INSTEAD (Jolley ranchers or Stover's or Life Savers) or even ice chips will also do - the key is to swallow to prevent all throat clearing. ?NO OIL BASED VITAMINS - use powdered substitutes.  Avoid fish oil when coughing.  ? ?To get the most out of exercise, you need to be continuously aware that you are short of breath, but never out of breath, for at least 30 minutes daily. As you improve, it will actually be easier for you to do the same amount of exercise  in  30 minutes so always push to the level where you are short of breath.    ? ?If not better after 6 weeks you will need me to schedule a CPST. ? ? ?Venous dopplers will  be scheduled  ? ? ?Please remember to go to the lab and x-ray department  for your tests - we will call you with the results when  they are available. ?     ?    ?

## 2022-03-06 ENCOUNTER — Telehealth: Payer: Self-pay | Admitting: Internal Medicine

## 2022-03-06 LAB — IGE: IgE (Immunoglobulin E), Serum: 20 kU/L (ref <=114)

## 2022-03-06 NOTE — Progress Notes (Signed)
Called the pt and there was no answer- LMTCB    

## 2022-03-06 NOTE — Telephone Encounter (Signed)
I called and spoke with the pt regarding his labs and cxr results  ?Pt verbalized understanding  ?Nothing further needed ?

## 2022-03-06 NOTE — Progress Notes (Signed)
Spoke with pt and notified of results per Dr. Sherene Sires. Pt verbalized understanding and denied any questions. He states prefers to hold off on allergy referral at this time. He will discuss with MW further at next ov.

## 2022-03-08 ENCOUNTER — Encounter: Payer: Self-pay | Admitting: Internal Medicine

## 2022-03-08 NOTE — Assessment & Plan Note (Signed)
gerd rx 05/14/2017 >>> did not maint long term ?- Allergy profile 06/28/2017 >  Eos 0.0   And RAST 25  Pos grass trees cat and dog  ?- Allergy screen 03/05/22 >  Eos 0.8 /  IgE  20  ? ?Upper airway cough syndrome (previously labeled PNDS),  is so named because it's frequently impossible to sort out how much is  CR/sinusitis with freq throat clearing (which can be related to primary GERD)   vs  causing  secondary (" extra esophageal")  GERD from wide swings in gastric pressure that occur with throat clearing, often  promoting self use of mint and menthol lozenges that reduce the lower esophageal sphincter tone and exacerbate the problem further in a cyclical fashion.  ? ?These are the same pts (now being labeled as having "irritable larynx syndrome" by some cough centers) who not infrequently have a history of having failed to tolerate ace inhibitors,  dry powder inhalers or biphosphonates or report having atypical/extraesophageal reflux symptoms that don't respond to standard doses of PPI  and are easily confused as having aecopd or asthma flares by even experienced allergists/ pulmonologists (myself included).  ? ?Rec: max rx for gerd and pnds with 1st gen H1 blockers per guidelines  And regroup in 6 weeks if not better  ?

## 2022-03-08 NOTE — Assessment & Plan Note (Addendum)
S/ p PE 2014 [place] ?- Echo 05/05/17 wnl  ?- rx for gerd 05/14/2017  (see uacs)  ?- PFT's  06/28/2017  Completely wnl  ?- 03/05/2022   Walked on RA  x  3  lap(s) =  approx 750  ft  @ mod fast pace, stopped due to end of study  with lowest 02 sats 99% s sob   ? ?Sob more at rest with voice use is typical of UACS (see sep a/p) and note labs are normal with only objective finding = wt gain over baseline  so rec sub max ex program x 6 weeks then cpst if not improving  ? ? ?

## 2022-03-08 NOTE — Assessment & Plan Note (Signed)
Dx 2014  ? Hypercoagulable w/u 05/29/13>   Negative (scanned into epic) ?Venous Doppler on L  03/30/16 ? 1. No evidence of lower extremity deep vein thrombosis, left. ?2. Superficial thrombophlebitis, left mid calf. ?-  Restart DOAC 05/14/2017  @ maint rx = xarelto 10 mg daily indefinitely   ? ?>>>  repeat venous dopplers for c/o worse leg swelling advised  ? ?Each maintenance medication was reviewed in detail including emphasizing most importantly the difference between maintenance and prns and under what circumstances the prns are to be triggered using an action plan format where appropriate. ? ?Total time for H and P, chart review, counseling,  directly observing portions of ambulatory 02 saturation study/ and generating customized AVS unique to this office visit / same day charting  > 30 min  ?     ?  ?       ?

## 2022-03-19 ENCOUNTER — Ambulatory Visit (HOSPITAL_COMMUNITY)
Admission: RE | Admit: 2022-03-19 | Discharge: 2022-03-19 | Disposition: A | Discharge status: Home / Self Care | Payer: Managed Care, Other (non HMO) | Source: Ambulatory Visit | Attending: Cardiology | Admitting: Cardiology

## 2022-03-19 DIAGNOSIS — R0600 Dyspnea, unspecified: Secondary | ICD-10-CM | POA: Diagnosis not present

## 2022-03-19 DIAGNOSIS — R0609 Other forms of dyspnea: Secondary | ICD-10-CM | POA: Diagnosis present

## 2022-03-19 DIAGNOSIS — R600 Localized edema: Secondary | ICD-10-CM | POA: Diagnosis not present

## 2022-04-26 NOTE — Progress Notes (Deleted)
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 > w/u  Pos for sup phlebitis L calf 03/30/16 > xarelto   History of Present Illness  05/14/2017 1st [facility] office visit/ Darin Garcia  On no anticoagulants Chief Complaint  Patient presents with   Pulmonary Consult    Self referral. Pt has hx of PE. Pt states he has spells and chest congestion and SOB for the past 4 yrs. He states his symptoms come and go, and today is a good day for him.  He last had symptoms approx 6 wks ago. He has occ noct cough- non prod at this time.   reproducible doe x steps Walks 3 miles 3 times a week in 30 min some hills  Every night coughs x 2 years immediately on supine position  Worse cough with voice use or beer drinking  Note father and brother may also have dvt/pe but his w/u for hypercoagulability neg in [place] per report  rec Xarelto 10 mg daily  Pantoprazole (protonix) 40 mg   Take  30-60 min before first meal of the day and Pepcid (famotidine)  20 mg one @  bedtime until return to office - this is the best way to tell whether stomach acid is contributing to your problem.   GERD diet     06/28/2017  f/u ov/Darin Garcia re:   PE/dvt/ uacs better while on gerd rx  Chief Complaint  Patient presents with   Follow-up    Pt had a PFT and is here to find out the results of that. Denies any complaints or concerns.   has not started any ex/ leg swelling less on xarelto, not  always taking with food  rec Restart Pantoprazole (protonix) 40 mg   Take  30-60 min before first meal of the day and Pepcid (famotidine)  20 mg one @  bedtime until return to office - this is the best way to tell whether stomach acid is contributing to your problem.   GERD diet  For drainage / throat tickle try take CHLORPHENIRAMINE  4 mg - take one every 4 hours as needed - available over the counter- may cause drowsiness so start with just a bedtime dose or two and see how you tolerate it before trying in daytime   To get the most out of exercise, you need to be continuously aware that you are short of breath, but never out of breath, for 30 minutes daily.       01/14/2018 acute extended ov/Darin Garcia re: cough/ L cp Chief Complaint  Patient presents with   Acute Visit    Called in on 01/12/18 with c/o cough and sinus pain- rx omnicef x 10 days. He is now wheezing and feeling SOB- had to use rescue inhaler.  He also has had night sweats. No  fever today.   acute onset 01/11/18 bad chills/ facial/frontal ha/ cough with brown mucus rx 01/12/18 with omnicef Cp center to slt left of sternum s radiation  worse with coughing esp one day prior to [place] resolved p saba  Recurrent pattern of URI /sinusitis x 3 years / not on maint gerd rx Chills and sinus pain better x last 24 h prior to OV   - no saba on day of ov  rec For congested cough best choice over the counter is mucinex dm take up to 1200 mg every 12 hours as needed with large glass of water  Only use your albuterol as a rescue medication Any time you start coughing> Try prilosec otc 20mg   Take 30-60 min before first meal of the day and Pepcid ac (famotidine) 20 mg one @  bedtime until cough is completely gone for at least a week without the need for cough suppression GERD diet       03/05/2022  f/u ov/Darin Garcia re: cough/ sob maint on prophylactic doses xarelto for PE/DVT and PAF Chief Complaint  Patient presents with   Follow-up    Patient  says he's having issues with shortness of breath.  Dyspnea:  onset Feb 2023 assoc with sob at rest talking/ assoc with wt gain/  already seen by afib clinic  Can do up 40 min walk s stopping though this is flat at mod pace  Cough: 24/7 but worse at hs but settles down overnight  Sleeping: most nights wakes up bed is flat / 2 pillows  SABA use: none  02: none  Covid status:   vax x 3  Rec For drainage / throat tickle try take CHLORPHENIRAMINE  4 mg   Pantoprazole (protonix) 40 mg   Take  30-60 min before first meal of the day and Pepcid (famotidine)  20 mg after supper until return to office - this is the best way to tell whether stomach acid is contributing to your problem.   GERD diet reviewed, bed blocks rec   To get the most out of exercise, you need to be continuously aware that you are short of breath, but never out of breath, for at least 30 minutes daily If not better after 6 weeks you will need me to schedule a CPST. Venous dopplers will  be scheduled - venous dopplers  03/19/22 neg       04/27/2022  f/u ov/Darin Garcia re: ***   maint on ***  No chief complaint on file.   Dyspnea:  *** Cough: *** Sleeping: *** SABA use: *** 02: *** Covid status:   ***   No obvious day to day or daytime variability or assoc excess/ purulent sputum or mucus plugs or hemoptysis or cp or chest tightness, subjective wheeze or overt sinus or hb symptoms.   *** without nocturnal  or early am exacerbation  of respiratory  c/o's or need for noct saba. Also denies any obvious fluctuation of symptoms with weather or environmental changes or other aggravating or alleviating factors except as outlined above   No unusual exposure hx or h/o childhood pna/ asthma or knowledge of premature birth.  Current Allergies, Complete Past Medical History, Past Surgical History, Family History, and Social History were reviewed in [employer] record.  ROS  The following are not active complaints unless  bolded Hoarseness, sore throat, dysphagia, dental problems, itching, sneezing,  nasal congestion or discharge of excess mucus or purulent secretions, ear ache,  fever, chills, sweats, unintended wt loss or wt gain, classically pleuritic or exertional cp,  orthopnea pnd or arm/hand swelling  or leg swelling, presyncope, palpitations, abdominal pain, anorexia, nausea, vomiting, diarrhea  or change in bowel habits or change in bladder habits, change in stools or change in urine, dysuria, hematuria,  rash, arthralgias, visual complaints, headache, numbness, weakness or ataxia or problems with walking or coordination,  change in mood or  memory.        No outpatient medications have been marked as taking for the 04/27/22 encounter (Appointment) with Tanda Rockers, MD.           Objective:   Physical Exam   04/27/2022        ***  03/05/2022       268 04/04/2018       249   01/14/18 279 lb 6.4 oz (126.7 kg)  10/05/17 273 lb 9.6 oz (124.1 kg)  06/28/17 272 lb (123.4 kg)     Vital signs reviewed  04/27/2022  - Note at rest 02 sats  ***% on ***   General appearance:    ***     serpinous vericosities both legs L >> R***          Assessment:

## 2022-04-27 ENCOUNTER — Ambulatory Visit: Payer: Managed Care, Other (non HMO) | Admitting: Internal Medicine

## 2022-05-15 ENCOUNTER — Other Ambulatory Visit: Payer: Self-pay | Admitting: Podiatry

## 2022-05-15 ENCOUNTER — Ambulatory Visit (INDEPENDENT_AMBULATORY_CARE_PROVIDER_SITE_OTHER): Payer: Managed Care, Other (non HMO) | Admitting: Podiatry

## 2022-05-15 DIAGNOSIS — Z79899 Other long term (current) drug therapy: Secondary | ICD-10-CM

## 2022-05-15 DIAGNOSIS — B351 Tinea unguium: Secondary | ICD-10-CM

## 2022-05-16 LAB — HEPATIC FUNCTION PANEL
ALT: 26 IU/L (ref 0–44)
AST: 25 IU/L (ref 0–40)
Albumin: 4.3 g/dL (ref 3.8–4.9)
Alkaline Phosphatase: 69 IU/L (ref 44–121)
Bilirubin Total: 0.8 mg/dL (ref 0.0–1.2)
Bilirubin, Direct: 0.21 mg/dL (ref 0.00–0.40)
Total Protein: 6.3 g/dL (ref 6.0–8.5)

## 2022-05-16 LAB — SPECIMEN STATUS REPORT

## 2022-05-20 MED ORDER — TERBINAFINE HCL 250 MG PO TABS: 250.0000 mg | ORAL_TABLET | Freq: Every day | ORAL | 0 refills | Status: DC

## 2022-05-20 NOTE — Progress Notes (Signed)
Subjective:  Patient ID: Darin Garcia, male    DOB: 05/23/1961,  MRN: [id number]  Chief Complaint  Patient presents with   Callouses    61 y.o. male presents with the above complaint.  Patient presents with complaint of bilateral thickened elongated dystrophic mycotic toenails x10.  He states he would like to discuss treatment options for this.  He has tried some topical stuff which has not helped.  He denies any other acute complaints.  He has mild pain but not too much pain associated with the nails   Review of Systems: Negative except as noted in the HPI. Denies N/V/F/Ch.  Past Medical History:  Diagnosis Date   Atrial flutter (HCC)    a. s/p TEE DCCV 11/2018.   Cardiomyopathy (HCC)    Dyspnea    GERD (gastroesophageal reflux disease)    History of DVT (deep vein thrombosis)    Pulmonary embolism (HCC)     Current Outpatient Medications:    famotidine (PEPCID) 20 MG tablet, One after supper, Disp: 30 tablet, Rfl: 11   metoprolol tartrate (LOPRESSOR) 25 MG tablet, Take 1 tablet by mouth every 6 hours as needed for breakthrough afib, Disp: 30 tablet, Rfl: 1   pantoprazole (PROTONIX) 40 MG tablet, Take 1 tablet (40 mg total) by mouth daily. Take 30-60 min before first meal of the day, Disp: 30 tablet, Rfl: 2   rivaroxaban (XARELTO) 10 MG TABS tablet, Take 1 tablet (10 mg total) by mouth daily., Disp: 90 tablet, Rfl: 3  Social History   Tobacco Use  Smoking Status Former   Packs/day: 1.00   Years: 4.00   Total pack years: 4.00   Types: Cigarettes   Quit date: 11/23/1993   Years since quitting: 28.5  Smokeless Tobacco Never  Tobacco Comments   Former smoker (08/14/2021)    No Known Allergies Objective:  There were no vitals filed for this visit. There is no height or weight on file to calculate BMI. Constitutional Well developed. Well nourished.  Vascular Dorsalis pedis pulses palpable bilaterally. Posterior tibial pulses palpable bilaterally. Capillary refill normal  to all digits.  No cyanosis or clubbing noted. Pedal hair growth normal.  Neurologic Normal speech. Oriented to person, place, and time. Epicritic sensation to light touch grossly present bilaterally.  Dermatologic Nails thickened elongated dystrophic mycotic toenails x10.  Mild fungus growth noted Skin within normal limits  Orthopedic: Normal joint ROM without pain or crepitus bilaterally. No visible deformities. No bony tenderness.   Radiographs: None Assessment:   1. Long-term use of high-risk medication   2. Nail fungus   3. Onychomycosis due to dermatophyte    Plan:  Patient was evaluated and treated and all questions answered.  Onychomycosis toenails x10 -Educated the patient on the etiology of onychomycosis and various treatment options associated with improving the fungal load.  I explained to the patient that there is 3 treatment options available to treat the onychomycosis including topical, p.o., laser treatment.  Patient elected to undergo p.o. options with Lamisil/terbinafine therapy.  In order for me to start the medication therapy, I explained to the patient the importance of evaluating the liver and obtaining the liver function test.  Once the liver function test comes back normal I will start him on 38-month course of Lamisil therapy.  Patient understood all risk and would like to proceed with Lamisil therapy.  I have asked the patient to immediately stop the Lamisil therapy if she has any reactions to it and call the office or go  to the emergency room right away.  Patient states understanding   No follow-ups on file.

## 2022-05-20 NOTE — Addendum Note (Signed)
Addended by: Nicholes Rough on: 05/20/2022 12:06 PM   Modules accepted: Orders

## 2022-06-12 IMAGING — DX DG CHEST 2V
2 series · 2 of 2 positions shown · non-contrast
Comparison: 04/27/2021

CLINICAL DATA: 60-year-old male with cough

EXAM:
CHEST - 2 VIEW

[chest pa]
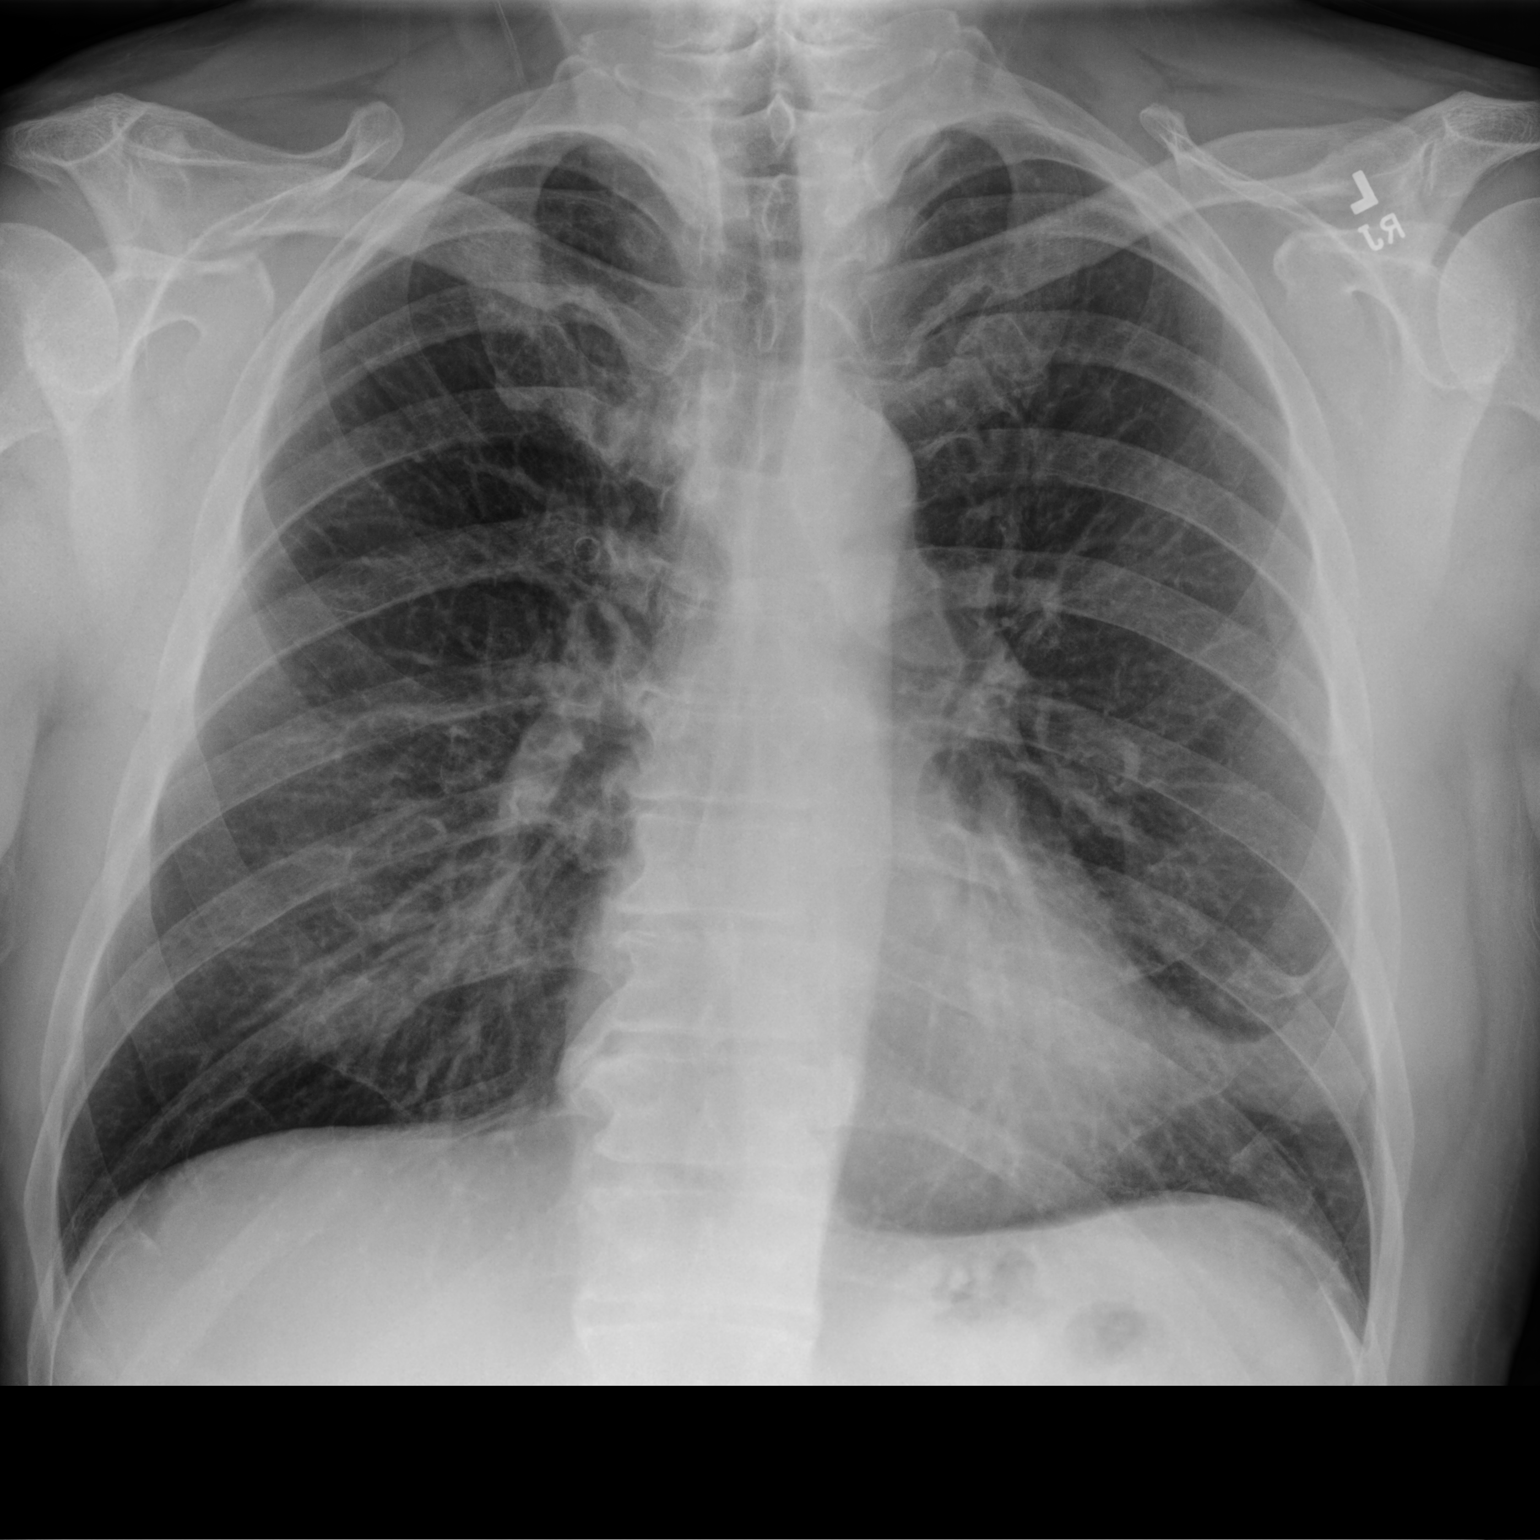

[chest lat]
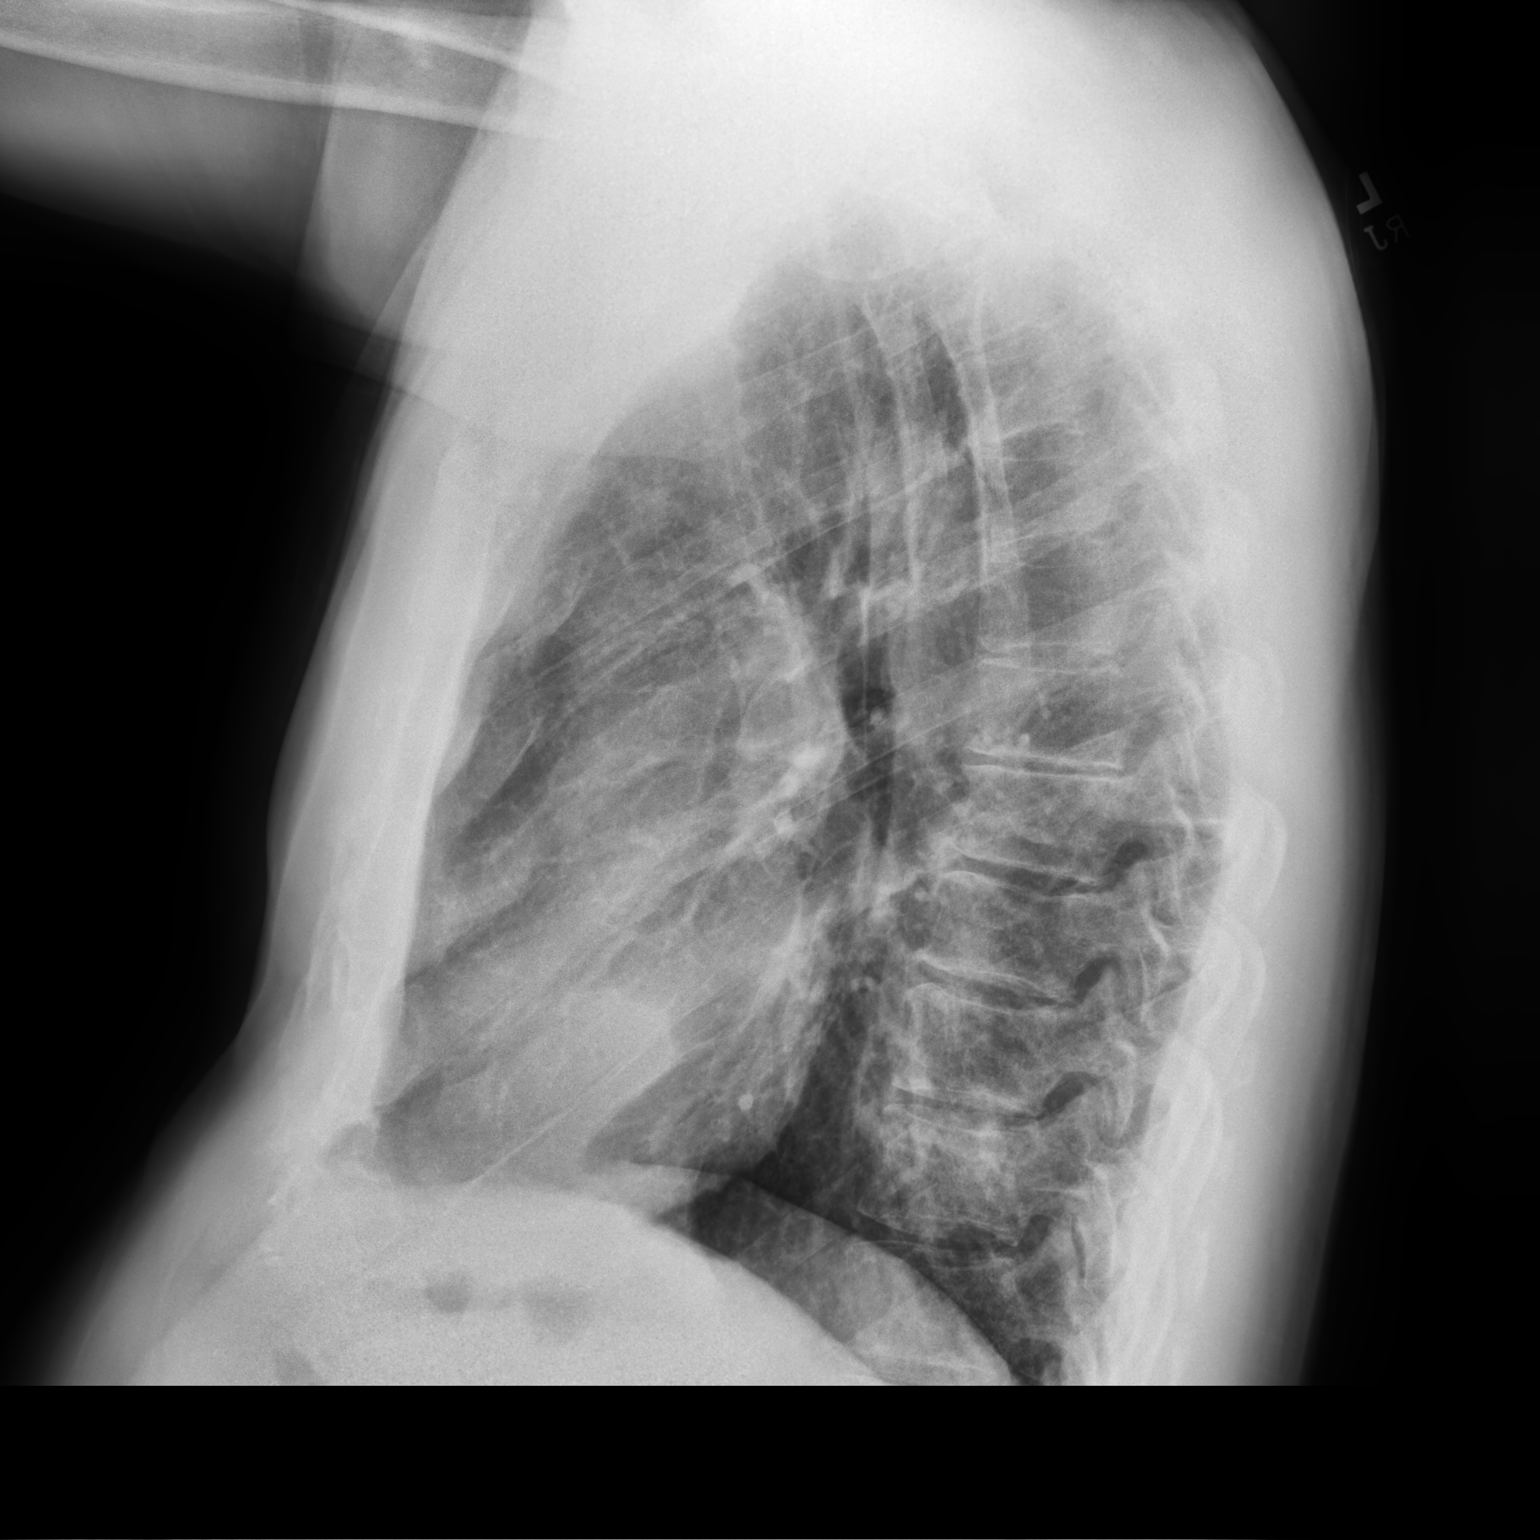

[2 of 2 positions shown; findings below may reference images not displayed]

FINDINGS: Cardiomediastinal silhouette unchanged in size and contour. No
evidence of central vascular congestion. No interlobular septal
thickening.

No pneumothorax or pleural effusion. Coarsened interstitial
markings, with no confluent airspace disease.

No acute displaced fracture. Degenerative changes of the spine.
IMPRESSION: No active cardiopulmonary disease.

## 2022-08-19 ENCOUNTER — Ambulatory Visit (INDEPENDENT_AMBULATORY_CARE_PROVIDER_SITE_OTHER): Payer: Managed Care, Other (non HMO) | Admitting: Podiatry

## 2022-08-19 DIAGNOSIS — M7751 Other enthesopathy of right foot: Secondary | ICD-10-CM | POA: Diagnosis not present

## 2022-08-19 DIAGNOSIS — M21621 Bunionette of right foot: Secondary | ICD-10-CM

## 2022-08-19 NOTE — Progress Notes (Signed)
Subjective:  Patient ID: Darin Garcia, male    DOB: Feb 14, 1961,  MRN: [id number]  Chief Complaint  Patient presents with   Callouses    61 y.o. male presents with the above complaint.  Patient presents with right fifth metatarsal tailor's bunion with underlying capsulitis.  Patient very painful  Progressive gotten worse.  We will mention this incident wound is now becoming more bothersome keeps coming back shaving it down is helpful.  He denies any other acute complaints he is tried some shoe gear modification which has not helped.   Review of Systems: Negative except as noted in the HPI. Denies N/V/F/Ch.  Past Medical History:  Diagnosis Date   Atrial flutter (HCC)    a. s/p TEE DCCV 11/2018.   Cardiomyopathy (HCC)    Dyspnea    GERD (gastroesophageal reflux disease)    History of DVT (deep vein thrombosis)    Pulmonary embolism (HCC)     Current Outpatient Medications:    famotidine (PEPCID) 20 MG tablet, One after supper, Disp: 30 tablet, Rfl: 11   metoprolol tartrate (LOPRESSOR) 25 MG tablet, Take 1 tablet by mouth every 6 hours as needed for breakthrough afib, Disp: 30 tablet, Rfl: 1   pantoprazole (PROTONIX) 40 MG tablet, Take 1 tablet (40 mg total) by mouth daily. Take 30-60 min before first meal of the day, Disp: 30 tablet, Rfl: 2   rivaroxaban (XARELTO) 10 MG TABS tablet, Take 1 tablet (10 mg total) by mouth daily., Disp: 90 tablet, Rfl: 3   terbinafine (LAMISIL) 250 MG tablet, Take 1 tablet (250 mg total) by mouth daily., Disp: 90 tablet, Rfl: 0  Social History   Tobacco Use  Smoking Status Former   Packs/day: 1.00   Years: 4.00   Total pack years: 4.00   Types: Cigarettes   Quit date: 11/23/1993   Years since quitting: 28.7  Smokeless Tobacco Never  Tobacco Comments   Former smoker (08/14/2021)    No Known Allergies Objective:  There were no vitals filed for this visit. There is no height or weight on file to calculate BMI. Constitutional Well  developed. Well nourished.  Vascular Dorsalis pedis pulses palpable bilaterally. Posterior tibial pulses palpable bilaterally. Capillary refill normal to all digits.  No cyanosis or clubbing noted. Pedal hair growth normal.  Neurologic Normal speech. Oriented to person, place, and time. Epicritic sensation to light touch grossly present bilaterally.  Dermatologic Nails well groomed and normal in appearance. No open wounds. No skin lesions.  Orthopedic: Pain on palpation of the right medial bone.  Patient has a pronounced tailor's bunion.  Pain on palpation to the bunion deformity patient does not have hallux valgus bunion deformity.  Pain with some range of motion of the fifth metatarsal phalangeal joint likely due to capsulitis.  Hyperkeratotic lesion noted on the fifth metatarsal head laterally   Radiographs: None Assessment:   1. Tailor's bunionette, right   2. Capsulitis of metatarsophalangeal (MTP) joint of right foot    Plan:  Patient was evaluated and treated and all questions answered.  Right fifth metatarsal tailor's bunion with underlying capsulitis -All questions and concerns were discussed with the patient in extensive detail given the amount of pain that he is having he will benefit from steroid injection of decrease acute inflammatory component associate with pain.  Patient agrees with plan Proceed with injection. -A steroid injection was performed at right fifth MTP using 1% plain Lidocaine and 10 mg of Kenalog. This was well tolerated.   -If there  is no improvement we will discuss surgical reduction of tailor's bunion with screw fixation  No follow-ups on file.

## 2022-09-04 ENCOUNTER — Ambulatory Visit (HOSPITAL_COMMUNITY)
Admission: RE | Admit: 2022-09-04 | Discharge: 2022-09-04 | Disposition: A | Discharge status: Home / Self Care | Payer: Managed Care, Other (non HMO) | Source: Ambulatory Visit | Attending: Physician Assistant | Admitting: Physician Assistant

## 2022-09-04 VITALS — BP 148/72 | HR 55 | Ht 75.0 in | Wt 261.2 lb

## 2022-09-04 DIAGNOSIS — I82409 Acute embolism and thrombosis of unspecified deep veins of unspecified lower extremity: Secondary | ICD-10-CM | POA: Insufficient documentation

## 2022-09-04 DIAGNOSIS — Z6832 Body mass index (BMI) 32.0-32.9, adult: Secondary | ICD-10-CM | POA: Diagnosis not present

## 2022-09-04 DIAGNOSIS — I4892 Unspecified atrial flutter: Secondary | ICD-10-CM | POA: Diagnosis not present

## 2022-09-04 DIAGNOSIS — I428 Other cardiomyopathies: Secondary | ICD-10-CM | POA: Diagnosis not present

## 2022-09-04 DIAGNOSIS — E669 Obesity, unspecified: Secondary | ICD-10-CM | POA: Insufficient documentation

## 2022-09-04 DIAGNOSIS — I480 Paroxysmal atrial fibrillation: Secondary | ICD-10-CM | POA: Diagnosis present

## 2022-09-04 DIAGNOSIS — I700 Atherosclerosis of aorta: Secondary | ICD-10-CM | POA: Insufficient documentation

## 2022-09-04 DIAGNOSIS — Z7901 Long term (current) use of anticoagulants: Secondary | ICD-10-CM | POA: Diagnosis not present

## 2022-09-04 DIAGNOSIS — I2699 Other pulmonary embolism without acute cor pulmonale: Secondary | ICD-10-CM | POA: Insufficient documentation

## 2022-09-04 MED ORDER — RIVAROXABAN 10 MG PO TABS: 10.0000 mg | ORAL_TABLET | Freq: Every day | ORAL | 3 refills | Status: DC

## 2022-09-04 NOTE — Progress Notes (Signed)
Primary Care Physician: Loraine Leriche., MD Primary Cardiologist: Dr Marlou Porch Primary Electrophysiologist: Dr Rayann Heman Referring Physician: Dr Lahoma Rocker is a 61 y.o. male with a history of typical atrial flutter s/p ablation, recurrent DVT and pulmonary embolism, cardiomyopathy (EF is 45-50% in the setting of rapidly conducting atrial flutter) who presents for follow up in the [facility]. Patient has a CHADS2VASC score of 1. He is s/p flutter ablation x 2 in 2020. Patient was seen at the ED 04/27/21 with tachypalpitations and dizziness. ECG showed rapid afib on arrival but he converted to SR spontaneously. He had another episode 04/30/21 which lasted for several hours. There were no specific triggers that he could identify. Patient is s/p afib ablation 07/17/21 with Dr Rayann Heman.   On follow up today, patient reports that he has done well since his last visit with no known episodes of afib. No bleeding issues on anticoagulation.    Today, he denies symptoms of palpitations, chest pain, orthopnea, PND, lower extremity edema, presyncope, syncope, daytime somnolence, bleeding, or neurologic sequela. The patient is tolerating medications without difficulties and is otherwise without complaint today.    Atrial Fibrillation Risk Factors:  he does not have symptoms or diagnosis of sleep apnea. Negative sleep study. he does not have a history of rheumatic fever. he does not have a history of alcohol use.   he has a BMI of Body mass index is 32.65 kg/m.[name] Filed Weights   09/04/22 1120  Weight: 118.5 kg    Family History  Problem Relation Age of Onset   Pulmonary embolism Father    CAD Sister      Atrial Fibrillation Management history:  Previous antiarrhythmic drugs: none Previous cardioversions: 11/25/18 Previous ablations: 04/04/19 flutter, 08/15/19 flutter, 07/17/21 CHADS2VASC score: 1 Anticoagulation history: Xarelto (has been on 10 mg for h/o DVT  and PE)   Past Medical History:  Diagnosis Date   Atrial flutter ([place])    a. s/p TEE DCCV 11/2018.   Cardiomyopathy ([place])    Dyspnea    GERD (gastroesophageal reflux disease)    History of DVT (deep vein thrombosis)    Pulmonary embolism ([place])    Past Surgical History:  Procedure Laterality Date   A-FLUTTER ABLATION N/A 04/04/2019   Procedure: A-FLUTTER ABLATION;  Surgeon: Thompson Grayer, MD;  Location: [place] [facility];  Service: Cardiovascular;  Laterality: N/A;   A-FLUTTER ABLATION N/A 08/15/2019   Procedure: A-FLUTTER ABLATION;  Surgeon: Thompson Grayer, MD;  Location: [place] [facility];  Service: Cardiovascular;  Laterality: N/A;   ATRIAL FIBRILLATION ABLATION N/A 07/17/2021   Procedure: ATRIAL FIBRILLATION ABLATION;  Surgeon: Thompson Grayer, MD;  Location: [place] [facility];  Service: Cardiovascular;  Laterality: N/A;   ATRIAL FLUTTER ABLATION  08/15/2019   CARDIOVERSION N/A 11/25/2018   Procedure: CARDIOVERSION;  Surgeon: Lelon Perla, MD;  Location: [facility] ENDOSCOPY;  Service: Cardiovascular;  Laterality: N/A;   TEE WITHOUT CARDIOVERSION N/A 11/25/2018   Procedure: TRANSESOPHAGEAL ECHOCARDIOGRAM (TEE);  Surgeon: Lelon Perla, MD;  Location: [facility] ENDOSCOPY;  Service: Cardiovascular;  Laterality: N/A;   TEE WITHOUT CARDIOVERSION  08/15/2019   TEE WITHOUT CARDIOVERSION N/A 08/15/2019   Procedure: TRANSESOPHAGEAL ECHOCARDIOGRAM (TEE);  Surgeon: Jerline Pain, MD;  Location: [facility] ENDOSCOPY;  Service: Cardiovascular;  Laterality: N/A;    Current Outpatient Medications  Medication Sig Dispense Refill   famotidine (PEPCID) 20 MG tablet One after supper 30 tablet 11   metoprolol tartrate (LOPRESSOR) 25 MG tablet  Take 1 tablet by mouth every 6 hours as needed for breakthrough afib 30 tablet 1   pantoprazole (PROTONIX) 40 MG tablet Take 1 tablet (40 mg total) by mouth daily. Take 30-60 min before first meal of the day 30 tablet 2   rivaroxaban (XARELTO) 10 MG TABS tablet Take 1 tablet (10  mg total) by mouth daily. 90 tablet 3   No current facility-administered medications for this encounter.    No Known Allergies  Social History   Socioeconomic History   Marital status: Married    Spouse name: michelle   Number of children: Not on file   Years of education: Not on file   Highest education level: Not on file  Occupational History    Employer: [employer]  Tobacco Use   Smoking status: Former    Packs/day: 1.00    Years: 4.00    Total pack years: 4.00    Types: Cigarettes    Quit date: 11/23/1993    Years since quitting: 28.8   Smokeless tobacco: Never   Tobacco comments:    Former smoker (08/14/2021)  Vaping Use   Vaping Use: Never used  Substance and Sexual Activity   Alcohol use: Yes    Comment: only during the [place] 02/12/22   Drug use: Never   Sexual activity: Not on file  Other Topics Concern   Not on file  Social History Narrative   He is originally from [place].  He moved to [place] 3 years ago.  He is married.  He has 2 children (23 and 18).  He works in Press photographer for [employer].   Social Determinants of Health   Financial Resource Strain: Low Risk  (11/23/2018)   Overall Financial Resource Strain (CARDIA)    Difficulty of Paying Living Expenses: Not very hard  Food Insecurity: No Food Insecurity (11/23/2018)   Hunger Vital Sign    Worried About Running Out of Food in the Last Year: Never true    Ran Out of Food in the Last Year: Never true  Transportation Needs: No Transportation Needs (11/23/2018)   PRAPARE - Hydrologist (Medical): No    Lack of Transportation (Non-Medical): No  Physical Activity: Insufficiently Active (11/23/2018)   Exercise Vital Sign    Days of Exercise per Week: 4 days    Minutes of Exercise per Session: 30 min  Stress: No Stress Concern Present (11/23/2018)   [place]    Feeling of Stress : Only a little  Social  Connections: Not on file  Intimate Partner Violence: Not on file     ROS- All systems are reviewed and negative except as per the HPI above.  Physical Exam: Vitals:   09/04/22 1120  BP: (!) 148/72  Pulse: (!) 55  Weight: 118.5 kg  Height: 6\' 3"  (1.905 m)    GEN- The patient is a well appearing male, alert and oriented x 3 today.   HEENT-head normocephalic, atraumatic, sclera clear, conjunctiva pink, hearing intact, trachea midline. Lungs- Clear to ausculation bilaterally, normal work of breathing Heart- Regular rate and rhythm, no murmurs, rubs or gallops  GI- soft, NT, ND, + BS Extremities- no clubbing, cyanosis, or edema MS- no significant deformity or atrophy Skin- no rash or lesion Psych- euthymic mood, full affect Neuro- strength and sensation are intact   Wt Readings from Last 3 Encounters:  09/04/22 118.5 kg  03/05/22 121.7 kg  02/12/22 121.1 kg  EKG today demonstrates  SB, PVC Vent. rate 55 BPM PR interval 152 ms QRS duration 78 ms QT/QTcB 400/382 ms  Echo 12/28/19 demonstrated   1. Left ventricular ejection fraction, by visual estimation, is 60 to  65%. The left ventricle has normal function. There is mildly increased left ventricular hypertrophy.   2. Left ventricular diastolic parameters are consistent with Grade I  diastolic dysfunction (impaired relaxation).   3. The left ventricle has no regional wall motion abnormalities.   4. Global right ventricle has normal systolic function.The right  ventricular size is normal. No increase in right ventricular wall  thickness.   5. Left atrial size was normal.   6. Right atrial size was normal.   7. The mitral valve is normal in structure. No evidence of mitral valve  regurgitation. No evidence of mitral stenosis.   8. The tricuspid valve is normal in structure.   9. The tricuspid valve is normal in structure. Tricuspid valve  regurgitation is mild.  10. The aortic valve is normal in structure. Aortic valve  regurgitation is not visualized. No evidence of aortic valve sclerosis or stenosis.  11. The pulmonic valve was normal in structure. Pulmonic valve  regurgitation is not visualized.  12. Normal pulmonary artery systolic pressure.  13. The inferior vena cava is normal in size with greater than 50%  respiratory variability, suggesting right atrial pressure of 3 mmHg.   Epic records are reviewed at length today   CHA2DS2-VASc Score = 1  The patient's score is based upon: CHF History: 0 (EF recovered) HTN History: 0 Diabetes History: 0 Stroke History: 0 Vascular Disease History: 1 (CAC score 240) Age Score: 0 Gender Score: 0       ASSESSMENT AND PLAN: 1. Paroxysmal Atrial Fibrillation/atrial flutter The patient's CHA2DS2-VASc score is 1, indicating a 0.6% annual risk of stroke.   S/p typical flutter ablation 04/04/19 with repeat ablation 08/15/19. Now s/p afib ablation 07/17/21 Patient appears to be maintaining SR. Continue Lopressor 25 mg q6 hours PRN for heart racing.  Continue Xarelto 10 mg daily (prior DVT/PE)  2. Obesity Body mass index is 32.65 kg/m. Lifestyle modification was discussed and encouraged including regular physical activity and weight reduction.  3. NICM EF normalized with SR. Fluid status appears stable.  4. Coronary calcification Noted on CT with CAC 240, 85th percentile  FFR negative. No anginal symptoms.   Follow up with EP to establish care in 6 months. AF clinic in one year.    [facility] [address] [place], [place] [address] [phone] 09/04/2022 11:46 AM

## 2022-11-13 ENCOUNTER — Other Ambulatory Visit: Payer: Self-pay | Admitting: Internal Medicine

## 2022-11-13 NOTE — Telephone Encounter (Signed)
Prescription refill request for Xarelto received.  Indication:afib Last office visit:3/23 Weight:118.5  kg Age:61 Scr:0.9 CrCl:146.3  ml/min  Prescription refilled

## 2023-01-22 ENCOUNTER — Ambulatory Visit
Admission: EM | Admit: 2023-01-22 | Discharge: 2023-01-22 | Disposition: A | Discharge status: Home / Self Care | Payer: Managed Care, Other (non HMO) | Attending: Urgent Care | Admitting: Urgent Care

## 2023-01-22 DIAGNOSIS — B349 Viral infection, unspecified: Secondary | ICD-10-CM

## 2023-01-22 DIAGNOSIS — J45991 Cough variant asthma: Secondary | ICD-10-CM

## 2023-01-22 DIAGNOSIS — U071 COVID-19: Secondary | ICD-10-CM | POA: Diagnosis not present

## 2023-01-22 MED ORDER — ACETAMINOPHEN 325 MG PO TABS: 650.0000 mg | ORAL_TABLET | Freq: Four times a day (QID) | ORAL | 0 refills | Status: DC | PRN

## 2023-01-22 MED ORDER — CETIRIZINE HCL 10 MG PO TABS: 10.0000 mg | ORAL_TABLET | Freq: Every day | ORAL | 0 refills | Status: DC

## 2023-01-22 MED ORDER — PROMETHAZINE-DM 6.25-15 MG/5ML PO SYRP: 5.0000 mL | ORAL_SOLUTION | Freq: Three times a day (TID) | ORAL | 0 refills | Status: DC | PRN

## 2023-01-22 MED ORDER — BENZONATATE 100 MG PO CAPS: 100.0000 mg | ORAL_CAPSULE | Freq: Three times a day (TID) | ORAL | 0 refills | Status: DC | PRN

## 2023-01-22 MED ORDER — OSELTAMIVIR PHOSPHATE 75 MG PO CAPS: 75.0000 mg | ORAL_CAPSULE | Freq: Two times a day (BID) | ORAL | 0 refills | Status: DC

## 2023-01-22 NOTE — Discharge Instructions (Signed)
We will notify you of your test results as they arrive and may take between about 24 hours.  I encourage you to sign up for MyChart if you have not already done so as this can be the easiest way for [place] to communicate results to you online or through a phone app.  Generally, we only contact you if it is a positive test result.  In the meantime, if you develop worsening symptoms including fever, chest pain, shortness of breath despite our current treatment plan then please report to the emergency room as this may be a sign of worsening status from possible viral infection.  Otherwise, we will manage this as a viral syndrome like influenza by starting Tamiflu. If your COVID test is positive then stop Tamiflu and we will get you started on a COVID anti-viral. Otherwise, with a negative COVID test you should finish the Tamiflu. For sore throat or cough try using a honey-based tea. Use 3 teaspoons of honey with juice squeezed from half lemon. Place shaved pieces of ginger into 1/2-1 cup of water and warm over stove top. Then mix the ingredients and repeat every 4 hours as needed. Please take Tylenol '500mg'$ -'650mg'$  every 6 hours for aches and pains, fevers. Hydrate very well with at least 2 liters of water. Eat light meals such as soups to replenish electrolytes and soft fruits, veggies. Start an antihistamine like Zyrtec ('10mg'$  daily) for postnasal drainage, sinus congestion.  You can take this together with the cough medications as needed.

## 2023-01-22 NOTE — ED Provider Notes (Addendum)
[facility]  Note:  This document was prepared using Systems analyst and may include unintentional dictation errors.  MRN: [id number] DOB: 1961-08-28  Subjective:   Darin Garcia is a 61 y.o. male presenting for 2 to 3-day history of acute onset fever, body pains, body aches, congestion, coughing that can elicit chest pain.  No shortness of breath, wheezing, heart racing.  No smoking.  Patient has a history of upper airway cough syndrome, cough variant asthma.  Does not feel like it is bothering him currently.  Has a history of atrial flutter, atrial fibrillation and pulmonary embolism.  Takes Xarelto for this as well as metoprolol.  No current facility-administered medications for this encounter.  Current Outpatient Medications:    famotidine (PEPCID) 20 MG tablet, One after supper, Disp: 30 tablet, Rfl: 11   metoprolol tartrate (LOPRESSOR) 25 MG tablet, Take 1 tablet by mouth every 6 hours as needed for breakthrough afib, Disp: 30 tablet, Rfl: 1   pantoprazole (PROTONIX) 40 MG tablet, Take 1 tablet (40 mg total) by mouth daily. Take 30-60 min before first meal of the day, Disp: 30 tablet, Rfl: 2   XARELTO 10 MG TABS tablet, TAKE 1 TABLET(10 MG) BY MOUTH DAILY, Disp: 90 tablet, Rfl: 3   No Known Allergies  Past Medical History:  Diagnosis Date   Atrial flutter ([place])    a. s/p TEE DCCV 11/2018.   Cardiomyopathy ([place])    Dyspnea    GERD (gastroesophageal reflux disease)    History of DVT (deep vein thrombosis)    Pulmonary embolism ([place])      Past Surgical History:  Procedure Laterality Date   A-FLUTTER ABLATION N/A 04/04/2019   Procedure: A-FLUTTER ABLATION;  Surgeon: Thompson Grayer, MD;  Location: [place] [facility];  Service: Cardiovascular;  Laterality: N/A;   A-FLUTTER ABLATION N/A 08/15/2019   Procedure: A-FLUTTER ABLATION;  Surgeon: Thompson Grayer, MD;  Location: [place] [facility];  Service: Cardiovascular;  Laterality: N/A;   ATRIAL  FIBRILLATION ABLATION N/A 07/17/2021   Procedure: ATRIAL FIBRILLATION ABLATION;  Surgeon: Thompson Grayer, MD;  Location: [place] [facility];  Service: Cardiovascular;  Laterality: N/A;   ATRIAL FLUTTER ABLATION  08/15/2019   CARDIOVERSION N/A 11/25/2018   Procedure: CARDIOVERSION;  Surgeon: Lelon Perla, MD;  Location: [facility] ENDOSCOPY;  Service: Cardiovascular;  Laterality: N/A;   TEE WITHOUT CARDIOVERSION N/A 11/25/2018   Procedure: TRANSESOPHAGEAL ECHOCARDIOGRAM (TEE);  Surgeon: Lelon Perla, MD;  Location: [facility] ENDOSCOPY;  Service: Cardiovascular;  Laterality: N/A;   TEE WITHOUT CARDIOVERSION  08/15/2019   TEE WITHOUT CARDIOVERSION N/A 08/15/2019   Procedure: TRANSESOPHAGEAL ECHOCARDIOGRAM (TEE);  Surgeon: Jerline Pain, MD;  Location: [facility] ENDOSCOPY;  Service: Cardiovascular;  Laterality: N/A;    Family History  Problem Relation Age of Onset   Pulmonary embolism Father    CAD Sister     Social History   Tobacco Use   Smoking status: Former    Packs/day: 1.00    Years: 4.00    Total pack years: 4.00    Types: Cigarettes    Quit date: 11/23/1993    Years since quitting: 29.1   Smokeless tobacco: Never   Tobacco comments:    Former smoker (08/14/2021)  Vaping Use   Vaping Use: Never used  Substance Use Topics   Alcohol use: Yes    Comment: only during the [place] 02/12/22   Drug use: Never    ROS   Objective:   Vitals: BP [name])  150/70 (BP Location: Left Arm)   Pulse (!) 52   Temp 97.8 F (36.6 C) (Oral)   Resp 20   SpO2 98%   Physical Exam Constitutional:      General: He is not in acute distress.    Appearance: Normal appearance. He is well-developed and normal weight. He is not ill-appearing, toxic-appearing or diaphoretic.  HENT:     Head: Normocephalic and atraumatic.     Right Ear: Tympanic membrane, ear canal and external ear normal. No drainage, swelling or tenderness. No middle ear effusion. There is no impacted cerumen. Tympanic membrane is not  erythematous or bulging.     Left Ear: Tympanic membrane, ear canal and external ear normal. No drainage, swelling or tenderness.  No middle ear effusion. There is no impacted cerumen. Tympanic membrane is not erythematous or bulging.     Nose: Nose normal. No congestion or rhinorrhea.     Mouth/Throat:     Mouth: Mucous membranes are moist.     Pharynx: No oropharyngeal exudate or posterior oropharyngeal erythema.  Eyes:     General: No scleral icterus.       Right eye: No discharge.        Left eye: No discharge.     Extraocular Movements: Extraocular movements intact.     Conjunctiva/sclera: Conjunctivae normal.  Cardiovascular:     Rate and Rhythm: Normal rate and regular rhythm.     Heart sounds: Normal heart sounds. No murmur heard.    No friction rub. No gallop.  Pulmonary:     Effort: Pulmonary effort is normal. No respiratory distress.     Breath sounds: Normal breath sounds. No stridor. No wheezing, rhonchi or rales.  Musculoskeletal:     Cervical back: Normal range of motion and neck supple. No rigidity. No muscular tenderness.  Neurological:     General: No focal deficit present.     Mental Status: He is alert and oriented to person, place, and time.  Psychiatric:        Mood and Affect: Mood normal.        Behavior: Behavior normal.        Thought Content: Thought content normal.    Assessment and Plan :   PDMP not reviewed this encounter.  1. Acute viral syndrome   2. Cough variant asthma     Low suspicion for an acute cardiopulmonary event.  Offered imaging but patient declined and I am in agreement given clear cardiopulmonary exam, hemodynamically stable vital signs.  Unfortunately, due to shortage I am not able to test for influenza.  Will treat empirically and test for COVID-19.  Start Tamiflu.  If his COVID test is to be positive, he is to stop Tamiflu and get on the COVID antiviral with molunpiravir given drug interactions with Xarelto and Paxlovid. Use  supportive care otherwise. Counseled patient on potential for adverse effects with medications prescribed/recommended today, ER and return-to-clinic precautions discussed, patient verbalized understanding.     Jaynee Eagles, PA-C 01/22/23 1726

## 2023-01-22 NOTE — ED Triage Notes (Signed)
Pt c/o F 100.0, body aches, chest and sinus congestion, cough, burning pain in chest with cough starting Wednesday. Home remedies: theraflu cold, tylenol.

## 2023-01-23 ENCOUNTER — Telehealth: Payer: Self-pay | Admitting: Urgent Care

## 2023-01-23 LAB — SARS CORONAVIRUS 2 (TAT 6-24 HRS): SARS Coronavirus 2: POSITIVE — AB

## 2023-01-23 MED ORDER — MOLNUPIRAVIR EUA 200MG CAPSULE: 4.0000 | ORAL_CAPSULE | Freq: Two times a day (BID) | ORAL | 0 refills | Status: AC

## 2023-01-23 NOTE — Telephone Encounter (Signed)
Patient has a take molnupiravir for COVID 19 due to medication interactions between Xarelto and Paxlovid.  Prescription sent to the pharmacy [facility] on [address].  Patient verbalizes understanding.  He is to stop Tamiflu.

## 2023-03-09 ENCOUNTER — Ambulatory Visit: Payer: Managed Care, Other (non HMO) | Admitting: Cardiology

## 2023-03-19 ENCOUNTER — Ambulatory Visit: Payer: Managed Care, Other (non HMO) | Attending: Cardiology | Admitting: Cardiology

## 2023-03-19 ENCOUNTER — Encounter: Payer: Self-pay | Admitting: Cardiology

## 2023-03-19 VITALS — BP 138/88 | HR 51 | Ht 75.0 in | Wt 259.0 lb

## 2023-03-19 DIAGNOSIS — I483 Typical atrial flutter: Secondary | ICD-10-CM | POA: Diagnosis not present

## 2023-03-19 DIAGNOSIS — I480 Paroxysmal atrial fibrillation: Secondary | ICD-10-CM

## 2023-03-19 NOTE — Progress Notes (Signed)
Electrophysiology Office Note   Date:  03/19/2023   ID:  Darin Garcia, DOB 07-Nov-1961, MRN [id number]  PCP:  Cheron Schaumann., MD  Cardiologist:  Anne Fu Primary Electrophysiologist:  Ksean Vale Jorja Loa, MD    Chief Complaint: AF   History of Present Illness: Darin Garcia is a 62 y.o. male who is being seen today for the evaluation of AF at the request of Alwyn Ren, Gretchen Y.,*. Presenting today for electrophysiology evaluation.  He has a history significant for atrial flutter post ablation, DVT PE, cardiomyopathy with normal ejection of his ejection fraction, atrial fibrillation.  He is post atrial flutter ablation x 2 in 2020.  He is post atrial fibrillation ablation 07/17/2021.  He feels quite poorly in atrial fibrillation with severe fatigue and shortness of breath.  He has had no episodes of atrial fibrillation since his ablation.  Today, he denies symptoms of palpitations, chest pain, shortness of breath, orthopnea, PND, lower extremity edema, claudication, dizziness, presyncope, syncope, bleeding, or neurologic sequela. The patient is tolerating medications without difficulties.    Past Medical History:  Diagnosis Date   Atrial flutter (HCC)    a. s/p TEE DCCV 11/2018.   Cardiomyopathy (HCC)    Dyspnea    GERD (gastroesophageal reflux disease)    History of DVT (deep vein thrombosis)    Pulmonary embolism (HCC)    Past Surgical History:  Procedure Laterality Date   A-FLUTTER ABLATION N/A 04/04/2019   Procedure: A-FLUTTER ABLATION;  Surgeon: Hillis Range, MD;  [facility];  Service: Cardiovascular;  Laterality: N/A;   A-FLUTTER ABLATION N/A 08/15/2019   Procedure: A-FLUTTER ABLATION;  Surgeon: Hillis Range, MD;  Location: [facility];  Service: Cardiovascular;  Laterality: N/A;   ATRIAL FIBRILLATION ABLATION N/A 07/17/2021   Procedure: ATRIAL FIBRILLATION ABLATION;  Surgeon: Hillis Range, MD;  [facility]: [facility];  Service:  Cardiovascular;  Laterality: N/A;   ATRIAL FLUTTER ABLATION  08/15/2019   CARDIOVERSION N/A 11/25/2018   Procedure: CARDIOVERSION;  Surgeon: Lewayne Bunting, MD;  Location: [facility] ENDOSCOPY;  Service: Cardiovascular;  Laterality: N/A;   TEE WITHOUT CARDIOVERSION N/A 11/25/2018   Procedure: TRANSESOPHAGEAL ECHOCARDIOGRAM (TEE);  Surgeon: Lewayne Bunting, MD;  [facility] ENDOSCOPY;  Service: Cardiovascular;  Laterality: N/A;   TEE WITHOUT CARDIOVERSION  08/15/2019   TEE WITHOUT CARDIOVERSION N/A 08/15/2019   Procedure: TRANSESOPHAGEAL ECHOCARDIOGRAM (TEE);  Surgeon: Jake Bathe, MD;  Location: [facility] ENDOSCOPY;  Service: Cardiovascular;  Laterality: N/A;     Current Outpatient Medications  Medication Sig Dispense Refill   XARELTO 10 MG TABS tablet TAKE 1 TABLET(10 MG) BY MOUTH DAILY 90 tablet 3   acetaminophen (TYLENOL) 325 MG tablet Take 2 tablets (650 mg total) by mouth every 6 (six) hours as needed for moderate pain. 30 tablet 0   benzonatate (TESSALON) 100 MG capsule Take 1 capsule (100 mg total) by mouth 3 (three) times daily as needed for cough. 30 capsule 0   cetirizine (ZYRTEC ALLERGY) 10 MG tablet Take 1 tablet (10 mg total) by mouth daily. 30 tablet 0   famotidine (PEPCID) 20 MG tablet One after supper 30 tablet 11   metoprolol tartrate (LOPRESSOR) 25 MG tablet Take 1 tablet by mouth every 6 hours as needed for breakthrough afib 30 tablet 1   oseltamivir (TAMIFLU) 75 MG capsule Take 1 capsule (75 mg total) by mouth 2 (two) times daily. 10 capsule 0   pantoprazole (PROTONIX) 40 MG tablet Take 1 tablet (40 mg total) by  mouth daily. Take 30-60 min before first meal of the day 30 tablet 2   promethazine-dextromethorphan (PROMETHAZINE-DM) 6.25-15 MG/5ML syrup Take 5 mLs by mouth 3 (three) times daily as needed for cough. 200 mL 0   No current facility-administered medications for this visit.    Allergies:   Patient has no known allergies.   Social History:  The patient  reports that he  quit smoking about 29 years ago. His smoking use included cigarettes. He has a 4.00 pack-year smoking history. He has never used smokeless tobacco. He reports current alcohol use. He reports that he does not use drugs.   Family History:  The patient's family history includes CAD in his sister; Pulmonary embolism in his father.    ROS:  Please see the history of present illness.   Otherwise, review of systems is positive for none.   All other systems are reviewed and negative.    PHYSICAL EXAM: VS:  BP 138/88   Pulse (!) 51   Ht 6\' 3"  (1.905 m)   Wt 259 lb (117.5 kg)   SpO2 98%   BMI 32.37 kg/m  , BMI Body mass index is 32.37 kg/m. GEN: Well nourished, well developed, in no acute distress  HEENT: normal  Neck: no JVD, carotid bruits, or masses Cardiac: RRR; no murmurs, rubs, or gallops,no edema  Respiratory:  clear to auscultation bilaterally, normal work of breathing GI: soft, nontender, nondistended, + BS MS: no deformity or atrophy  Skin: warm and dry Neuro:  Strength and sensation are intact Psych: euthymic mood, full affect  EKG:  EKG is ordered today. Personal review of the ekg ordered  shows sinus rhythm, PVC  Recent Labs: 05/15/2022: ALT 26    Lipid Panel     Component Value Date/Time   CHOL 205 (H) 11/24/2018 0219   TRIG 68 11/24/2018 0219   HDL 56 11/24/2018 0219   CHOLHDL 3.7 11/24/2018 0219   VLDL 14 11/24/2018 0219   LDLCALC 135 (H) 11/24/2018 0219     Wt Readings from Last 3 Encounters:  03/19/23 259 lb (117.5 kg)  09/04/22 261 lb 3.2 oz (118.5 kg)  03/05/22 268 lb 3.2 oz (121.7 kg)      Other studies Reviewed: Additional studies/ records that were reviewed today include: TTE 2022  Review of the above records today demonstrates:   1. Left ventricular ejection fraction, by estimation, is 55 to 60%. Left  ventricular ejection fraction by 3D volume is 55 %. The left ventricle has  normal function. The left ventricle has no regional wall motion   abnormalities. There is mild concentric  left ventricular hypertrophy. Left ventricular diastolic parameters are  consistent with Grade I diastolic dysfunction (impaired relaxation).   2. Right ventricular systolic function is normal. The right ventricular  size is normal. There is normal pulmonary artery systolic pressure.   3. The mitral valve is normal in structure. Trivial mitral valve  regurgitation. No evidence of mitral stenosis.   4. The aortic valve is tricuspid. Aortic valve regurgitation is not  visualized. No aortic stenosis is present.   5. The inferior vena cava is normal in size with greater than 50%  respiratory variability, suggesting right atrial pressure of 3 mmHg.    ASSESSMENT AND PLAN:  1.  Paroxysmal atrial fibrillation/flutter: CHA2DS2-VASc of 1.  Currently on Xarelto due to DVT/PE.  Is status post atrial flutter ablation 04/04/2019 with repeat ablation 08/15/2019.  Post atrial fibrillation ablation 07/17/2021.  Remains in sinus rhythm.  Hanley Rispoli  continue with current management.  2.  Chronic Solik heart failure: Due to nonischemic cardiomyopathy.  Ejection fraction has since normalized.  3.  Coronary artery calcifications: No current chest pain.  Current medicines are reviewed at length with the patient today.   The patient does not have concerns regarding his medicines.  The following changes were made today:  none  Labs/ tests ordered today include:  Orders Placed This Encounter  Procedures   EKG 12-Lead     Disposition:   FU 6 months  Signed, Shawnte Winton Jorja Loa, MD  03/19/2023 5:28 PM     [facility] [address] [place] [address] [phone] (office) [phone] (fax)

## 2023-03-19 NOTE — Patient Instructions (Signed)
  Medication Instructions:  Your physician recommends that you continue on your current medications as directed. Please refer to the Current Medication list given to you today.  *If you need a refill on your cardiac medications before your next appointment, please call your pharmacy*   Lab Work: None ordered If you have labs (blood work) drawn today and your tests are completely normal, you will receive your results only by: MyChart Message (if you have MyChart) OR A paper copy in the mail If you have any lab test that is abnormal or we need to change your treatment, we will call you to review the results.   Testing/Procedures: None ordered   Follow-Up: At [facility], you and your health needs are our priority.  As part of our continuing mission to provide you with exceptional heart care, we have created designated Provider Care Teams.  These Care Teams include your primary Cardiologist (physician) and Advanced Practice Providers (APPs -  Physician Assistants and Nurse Practitioners) who all work together to provide you with the care you need, when you need it.  Your next appointment:   3 month(s)  The format for your next appointment:   In Person  Provider:   You will follow up in the Atrial Fibrillation Clinic located at [facility]. Your provider will be: Clint R. Fenton, PA-C  Or  Landry Mellow, PA  Thank you for choosing [facility]!!   Dory Horn, RN [phone]

## 2023-05-03 ENCOUNTER — Ambulatory Visit: Payer: Managed Care, Other (non HMO) | Admitting: Podiatry

## 2023-06-17 NOTE — Progress Notes (Signed)
Primary Care Physician: Cheron Schaumann., MD Primary Cardiologist: Dr Anne Fu Primary Electrophysiologist: Dr Elberta Fortis  Referring Physician: Dr Margaretha Seeds is a 62 y.o. male with a history of typical atrial flutter s/p ablation, recurrent DVT and pulmonary embolism, cardiomyopathy (EF is 45-50% in the setting of rapidly conducting atrial flutter) who presents for follow up in the [facility]. Patient has a CHADS2VASC score of 1. He is s/p flutter ablation x 2 in 2020. Patient was seen at the ED 04/27/21 with tachypalpitations and dizziness. ECG showed rapid afib on arrival but he converted to SR spontaneously. He had another episode 04/30/21 which lasted for several hours. There were no specific triggers that he could identify. Patient is s/p afib ablation 07/17/21 with Dr Johney Frame.   On follow up today, patient reports that he has done very well since his last visit. He has not had any interim symptoms of afib. No bleeding issues on anticoagulation.   Today, he denies symptoms of palpitations, chest pain, orthopnea, PND, lower extremity edema, presyncope, syncope, daytime somnolence, bleeding, or neurologic sequela. The patient is tolerating medications without difficulties and is otherwise without complaint today.    Atrial Fibrillation Risk Factors:  he does not have symptoms or diagnosis of sleep apnea.  Negative sleep study. he does not have a history of rheumatic fever. he does not have a history of alcohol use.   Atrial Fibrillation Management history:  Previous antiarrhythmic drugs: none Previous cardioversions: 11/25/18 Previous ablations: 04/04/19 flutter, 08/15/19 flutter, 07/17/21 Anticoagulation history: Xarelto (has been on 10 mg for h/o DVT and PE)   Past Medical History:  Diagnosis Date   Atrial flutter (HCC)    a. s/p TEE DCCV 11/2018.   Cardiomyopathy (HCC)    Dyspnea    GERD (gastroesophageal reflux disease)    History of DVT  (deep vein thrombosis)    Pulmonary embolism (HCC)     ROS- All systems are reviewed and negative except as per the HPI above.  Physical Exam: Vitals:   06/18/23 0820  Height: 6\' 3"  (1.905 m)     GEN: Well nourished, well developed in no acute distress NECK: No JVD; No carotid bruits CARDIAC: Regular rate and rhythm, no murmurs, rubs, gallops RESPIRATORY:  Clear to auscultation without rales, wheezing or rhonchi  ABDOMEN: Soft, non-tender, non-distended EXTREMITIES:  No edema; No deformity    Wt Readings from Last 3 Encounters:  03/19/23 117.5 kg  09/04/22 118.5 kg  03/05/22 121.7 kg    EKG today demonstrates  SB Vent. rate 48 BPM PR interval 190 ms QRS duration 90 ms QT/QTcB 426/380 ms  Echo 12/28/19 demonstrated   1. Left ventricular ejection fraction, by visual estimation, is 60 to  65%. The left ventricle has normal function. There is mildly increased left ventricular hypertrophy.   2. Left ventricular diastolic parameters are consistent with Grade I  diastolic dysfunction (impaired relaxation).   3. The left ventricle has no regional wall motion abnormalities.   4. Global right ventricle has normal systolic function.The right  ventricular size is normal. No increase in right ventricular wall  thickness.   5. Left atrial size was normal.   6. Right atrial size was normal.   7. The mitral valve is normal in structure. No evidence of mitral valve  regurgitation. No evidence of mitral stenosis.   8. The tricuspid valve is normal in structure.   9. The tricuspid valve is normal in structure. Tricuspid valve  regurgitation is mild.  10. The aortic valve is normal in structure. Aortic valve regurgitation is not visualized. No evidence of aortic valve sclerosis or stenosis.  11. The pulmonic valve was normal in structure. Pulmonic valve  regurgitation is not visualized.  12. Normal pulmonary artery systolic pressure.  13. The inferior vena cava is normal in size with  greater than 50%  respiratory variability, suggesting right atrial pressure of 3 mmHg.   Epic records are reviewed at length today   CHA2DS2-VASc Score = 1  The patient's score is based upon: CHF History: 0 HTN History: 0 Diabetes History: 0 Stroke History: 0 Vascular Disease History: 1 Age Score: 0 Gender Score: 0        ASSESSMENT AND PLAN: Paroxysmal Atrial Fibrillation/atrial flutter The patient's CHA2DS2-VASc score is 1, indicating a 0.6% annual risk of stroke.   S/p typical flutter ablation 04/04/19 with repeat ablation 08/15/19. S/p afib ablation 07/17/21 Patient appears to be maintaining SR. Continue Lopressor 25 mg q6 hours PRN for heart racing.  Continue Xarelto 10 mg daily (prior DVT/PE)  NICM EF normalized with SR. Fluid status appears stable  Coronary calcification Noted on CT with CAC 240, 85th percentile  FFR negative. No anginal symptoms   Follow up in the AF clinic in one year.   Jorja Loa PA-C Afib Clinic [facility] [address] [place], [place] [address] [phone] 06/18/2023 8:23 AM

## 2023-06-18 ENCOUNTER — Encounter (HOSPITAL_COMMUNITY): Payer: Self-pay | Admitting: Physician Assistant

## 2023-06-18 ENCOUNTER — Ambulatory Visit (HOSPITAL_COMMUNITY)
Admission: RE | Admit: 2023-06-18 | Discharge: 2023-06-18 | Disposition: A | Discharge status: Home / Self Care | Payer: Managed Care, Other (non HMO) | Source: Ambulatory Visit | Attending: Physician Assistant | Admitting: Physician Assistant

## 2023-06-18 VITALS — BP 122/84 | HR 48 | Ht 75.0 in | Wt 262.2 lb

## 2023-06-18 DIAGNOSIS — Z86718 Personal history of other venous thrombosis and embolism: Secondary | ICD-10-CM | POA: Diagnosis not present

## 2023-06-18 DIAGNOSIS — I4892 Unspecified atrial flutter: Secondary | ICD-10-CM | POA: Diagnosis present

## 2023-06-18 DIAGNOSIS — I428 Other cardiomyopathies: Secondary | ICD-10-CM | POA: Insufficient documentation

## 2023-06-18 DIAGNOSIS — Z86711 Personal history of pulmonary embolism: Secondary | ICD-10-CM | POA: Insufficient documentation

## 2023-06-18 DIAGNOSIS — I480 Paroxysmal atrial fibrillation: Secondary | ICD-10-CM | POA: Insufficient documentation

## 2023-06-18 DIAGNOSIS — Z7901 Long term (current) use of anticoagulants: Secondary | ICD-10-CM | POA: Insufficient documentation

## 2023-09-22 ENCOUNTER — Ambulatory Visit
Admission: RE | Admit: 2023-09-22 | Discharge: 2023-09-22 | Disposition: A | Discharge status: Home / Self Care | Payer: Managed Care, Other (non HMO) | Source: Ambulatory Visit | Attending: Internal Medicine | Admitting: Internal Medicine

## 2023-09-22 VITALS — BP 137/80 | HR 57 | Temp 98.3°F | Resp 18

## 2023-09-22 DIAGNOSIS — I4891 Unspecified atrial fibrillation: Secondary | ICD-10-CM

## 2023-09-22 DIAGNOSIS — B9789 Other viral agents as the cause of diseases classified elsewhere: Secondary | ICD-10-CM | POA: Diagnosis not present

## 2023-09-22 DIAGNOSIS — J988 Other specified respiratory disorders: Secondary | ICD-10-CM | POA: Diagnosis not present

## 2023-09-22 MED ORDER — PROMETHAZINE-DM 6.25-15 MG/5ML PO SYRP: 5.0000 mL | ORAL_SOLUTION | Freq: Three times a day (TID) | ORAL | 0 refills | Status: DC | PRN

## 2023-09-22 MED ORDER — CETIRIZINE HCL 10 MG PO TABS: 10.0000 mg | ORAL_TABLET | Freq: Every day | ORAL | 0 refills | Status: DC

## 2023-09-22 NOTE — ED Triage Notes (Signed)
Pt reports chest congestion x 3 days, and feels is having a nasal congestion. Mucinex gives some relief.

## 2023-09-22 NOTE — ED Provider Notes (Signed)
[facility]  Note:  This document was prepared using Conservation officer, historic buildings and may include unintentional dictation errors.  MRN: [id number] DOB: 1961/03/27  Subjective:   Darin Garcia is a 62 y.o. male with pmh of atrial flutter, atrial fibrillation, PE, cardiomyopathy presenting for 3-day history of acute onset coughing, chest congestion, throat discomfort and sinus congestion.  He is hoping for an antibiotic.  Did a COVID test yesterday and was negative.  Regarding his history of asthma, reports that this turned out to not be the case.  Has not had to use any kind of inhaler or get steroids for these particular diagnoses.  Does not want a PCR COVID test.  No current facility-administered medications for this encounter.  Current Outpatient Medications:    Acetaminophen-guaiFENesin (MUCINEX COLD & FLU PO), Take by mouth., Disp: , Rfl:    metoprolol tartrate (LOPRESSOR) 25 MG tablet, Take 1 tablet by mouth every 6 hours as needed for breakthrough afib, Disp: 30 tablet, Rfl: 1   XARELTO 10 MG TABS tablet, TAKE 1 TABLET(10 MG) BY MOUTH DAILY, Disp: 90 tablet, Rfl: 3   No Known Allergies  Past Medical History:  Diagnosis Date   Atrial flutter (HCC)    a. s/p TEE DCCV 11/2018.   Cardiomyopathy (HCC)    Dyspnea    GERD (gastroesophageal reflux disease)    History of DVT (deep vein thrombosis)    Pulmonary embolism (HCC)      Past Surgical History:  Procedure Laterality Date   A-FLUTTER ABLATION N/A 04/04/2019   Procedure: A-FLUTTER ABLATION;  Surgeon: Hillis Range, MD;  Location: [facility]AS[facility] CV [facility];  Service: Cardiovascular;  Laterality: N/A;   A-FLUTTER ABLATION N/A 08/15/2019   Procedure: A-FLUTTER ABLATION;  Surgeon: Hillis Range, MD;  Location: [facility];  Service: Cardiovascular;  Laterality: N/A;   ATRIAL FIBRILLATION ABLATION N/A 07/17/2021   Procedure: ATRIAL FIBRILLATION ABLATION;  Surgeon: Hillis Range, MD;  Location: [facility] IN[facility]AS[facility]  CV LAB;  Service: Cardiovascular;  Laterality: N/A;   ATRIAL FLUTTER ABLATION  08/15/2019   CARDIOVERSION N/A 11/25/2018   Procedure: CARDIOVERSION;  Surgeon: Lewayne Bunting, MD;  Location: [facility] ENDOSCOPY;  Service: Cardiovascular;  Laterality: N/A;   TEE WITHOUT CARDIOVERSION N/A 11/25/2018   Procedure: TRANSESOPHAGEAL ECHOCARDIOGRAM (TEE);  Surgeon: Lewayne Bunting, MD;  Location: [facility] ENDOSCOPY;  Service: Cardiovascular;  Laterality: N/A;   TEE WITHOUT CARDIOVERSION  08/15/2019   TEE WITHOUT CARDIOVERSION N/A 08/15/2019   Procedure: TRANSESOPHAGEAL ECHOCARDIOGRAM (TEE);  Surgeon: Jake Bathe, MD;  Location: [facility] ENDOSCOPY;  Service: Cardiovascular;  Laterality: N/A;    Family History  Problem Relation Age of Onset   Pulmonary embolism Father    CAD Sister     Social History   Tobacco Use   Smoking status: Former    Current packs/day: 0.00    Average packs/day: 1 pack/day for 4.0 years (4.0 ttl pk-yrs)    Types: Cigarettes    Start date: 11/23/1989    Quit date: 11/23/1993    Years since quitting: 29.8   Smokeless tobacco: Never   Tobacco comments:    Former smoker (08/14/2021)  Vaping Use   Vaping status: Never Used  Substance Use Topics   Alcohol use: Not Currently    Comment: only during the hoildays 02/12/22   Drug use: Never    ROS   Objective:   Vitals: BP 137/80 (BP Location: Right Arm)   Pulse (!) 57   Temp 98.3 F (36.8 C) (  Oral)   Resp 18   SpO2 97%   Physical Exam Constitutional:      General: He is not in acute distress.    Appearance: Normal appearance. He is well-developed and normal weight. He is not ill-appearing, toxic-appearing or diaphoretic.  HENT:     Head: Normocephalic and atraumatic.     Right Ear: External ear normal.     Left Ear: External ear normal.     Nose: Nose normal.     Mouth/Throat:     Mouth: Mucous membranes are moist.     Pharynx: No pharyngeal swelling, oropharyngeal exudate, posterior oropharyngeal erythema or uvula  swelling.     Tonsils: No tonsillar exudate or tonsillar abscesses. 0 on the right. 0 on the left.  Eyes:     General: No scleral icterus.       Right eye: No discharge.        Left eye: No discharge.     Extraocular Movements: Extraocular movements intact.  Cardiovascular:     Rate and Rhythm: Normal rate and regular rhythm.     Heart sounds: Normal heart sounds. No murmur heard.    No friction rub. No gallop.  Pulmonary:     Effort: Pulmonary effort is normal. No respiratory distress.     Breath sounds: Normal breath sounds. No stridor. No wheezing, rhonchi or rales.  Musculoskeletal:     Cervical back: Normal range of motion.  Neurological:     Mental Status: He is alert and oriented to person, place, and time.  Psychiatric:        Mood and Affect: Mood normal.        Behavior: Behavior normal.        Thought Content: Thought content normal.        Judgment: Judgment normal.     Assessment and Plan :   PDMP not reviewed this encounter.  1. Viral respiratory infection   2. Atrial fibrillation, unspecified type [facility])    Patient declined a COVID test.  Offered imaging but he declined as well.  Discussed antibiotic stewardship, patient was ultimately agreeable to this.  Suspect viral URI, viral syndrome. Physical exam findings reassuring and vital signs stable for discharge. Advised supportive care, offered symptomatic relief. Counseled patient on potential for adverse effects with medications prescribed/recommended today, ER and return-to-clinic precautions discussed, patient verbalized understanding.     Wallis Bamberg, [place] 09/22/23 1122

## 2023-09-22 NOTE — Discharge Instructions (Signed)
We will manage this as a viral respiratory infection. For sore throat or cough try using a honey-based tea. Use 3 teaspoons of honey with juice squeezed from half lemon. Place shaved pieces of ginger into 1/2-1 cup of water and warm over stove top. Then mix the ingredients and repeat every 4 hours as needed. Please take Tylenol 650mg  once every 6 hours for fevers, aches and pains. Hydrate very well with at least 2 liters (80 ounces) of water. Eat light meals such as soups (chicken and noodles, chicken wild rice, vegetable).  Do not eat any foods that you are allergic to.  Start an antihistamine like Zyrtec (10mg  daily) for postnasal drainage, sinus congestion.  You can take this together with cough syrup.

## 2023-11-18 ENCOUNTER — Other Ambulatory Visit: Payer: Self-pay | Admitting: Cardiology

## 2023-11-18 ENCOUNTER — Other Ambulatory Visit (HOSPITAL_COMMUNITY): Payer: Self-pay

## 2023-11-18 DIAGNOSIS — I82401 Acute embolism and thrombosis of unspecified deep veins of right lower extremity: Secondary | ICD-10-CM

## 2023-11-18 MED ORDER — RIVAROXABAN 10 MG PO TABS
10.0000 mg | ORAL_TABLET | Freq: Every day | ORAL | 3 refills | Status: DC
  Filled 2023-11-18: qty 90, 90d supply, fill #0
  Filled 2024-02-08: qty 90, 90d supply, fill #1
  Filled 2024-05-11: qty 90, 90d supply, fill #2

## 2023-11-18 NOTE — Telephone Encounter (Signed)
Pt is requesting Xarelto

## 2023-11-18 NOTE — Telephone Encounter (Signed)
*  STAT* If patient is at the pharmacy, call can be transferred to refill team.   1. Which medications need to be refilled? (please list name of each medication and dose if known) XARELTO 10 MG TABS tablet    2. Would you like to learn more about the convenience, safety, & potential cost savings by using the [facility]?     3. Are you open to using the [facility] (Type [facility]. ).   4. Which pharmacy/location (including street and city if local pharmacy) is medication to be sent to? [id number] - [place], [place] - [address] AT [facility] OF [address]    5. Do they need a 30 day or 90 day supply? 90 day supply

## 2024-01-08 ENCOUNTER — Other Ambulatory Visit (HOSPITAL_BASED_OUTPATIENT_CLINIC_OR_DEPARTMENT_OTHER): Payer: Self-pay

## 2024-01-08 MED ORDER — ACETAMINOPHEN 325 MG PO TABS: 650.0000 mg | ORAL_TABLET | Freq: Four times a day (QID) | ORAL | 0 refills | Status: DC | PRN

## 2024-02-08 ENCOUNTER — Other Ambulatory Visit (HOSPITAL_BASED_OUTPATIENT_CLINIC_OR_DEPARTMENT_OTHER): Payer: Self-pay

## 2024-03-25 ENCOUNTER — Emergency Department (HOSPITAL_BASED_OUTPATIENT_CLINIC_OR_DEPARTMENT_OTHER)

## 2024-03-25 ENCOUNTER — Other Ambulatory Visit (HOSPITAL_BASED_OUTPATIENT_CLINIC_OR_DEPARTMENT_OTHER): Payer: Self-pay

## 2024-03-25 ENCOUNTER — Encounter (HOSPITAL_BASED_OUTPATIENT_CLINIC_OR_DEPARTMENT_OTHER): Payer: Self-pay | Admitting: Emergency Medicine

## 2024-03-25 ENCOUNTER — Emergency Department (HOSPITAL_BASED_OUTPATIENT_CLINIC_OR_DEPARTMENT_OTHER)
Admission: EM | Admit: 2024-03-25 | Discharge: 2024-03-25 | Disposition: A | Discharge status: Home / Self Care | Attending: Emergency Medicine | Admitting: Emergency Medicine

## 2024-03-25 DIAGNOSIS — Z7901 Long term (current) use of anticoagulants: Secondary | ICD-10-CM | POA: Insufficient documentation

## 2024-03-25 DIAGNOSIS — I4891 Unspecified atrial fibrillation: Secondary | ICD-10-CM | POA: Insufficient documentation

## 2024-03-25 DIAGNOSIS — R0602 Shortness of breath: Secondary | ICD-10-CM | POA: Diagnosis present

## 2024-03-25 LAB — BASIC METABOLIC PANEL WITH GFR
Anion gap: 12 (ref 5–15)
BUN: 13 mg/dL (ref 8–23)
CO2: 23 mmol/L (ref 22–32)
Calcium: 9.3 mg/dL (ref 8.9–10.3)
Chloride: 103 mmol/L (ref 98–111)
Creatinine, Ser: 0.93 mg/dL (ref 0.61–1.24)
GFR, Estimated: >60 mL/min (ref >60)
Glucose, Bld: 85 mg/dL (ref 70–99)
Potassium: 4.2 mmol/L (ref 3.5–5.1)
Sodium: 137 mmol/L (ref 135–145)

## 2024-03-25 LAB — CBC WITH DIFFERENTIAL/PLATELET
Abs Immature Granulocytes: 0.01 10*3/uL (ref 0.00–0.07)
Basophils Absolute: 0 10*3/uL (ref 0.0–0.1)
Basophils Relative: 1 %
Eosinophils Absolute: 0.1 10*3/uL (ref 0.0–0.5)
Eosinophils Relative: 1 %
HCT: 45.5 % (ref 39.0–52.0)
Hemoglobin: 15.9 g/dL (ref 13.0–17.0)
Immature Granulocytes: 0 %
Lymphocytes Relative: 35 %
Lymphs Abs: 1.7 10*3/uL (ref 0.7–4.0)
MCH: 31.2 pg (ref 26.0–34.0)
MCHC: 34.9 g/dL (ref 30.0–36.0)
MCV: 89.4 fL (ref 80.0–100.0)
Monocytes Absolute: 0.5 10*3/uL (ref 0.1–1.0)
Monocytes Relative: 11 %
Neutro Abs: 2.6 10*3/uL (ref 1.7–7.7)
Neutrophils Relative %: 52 %
Platelets: 223 10*3/uL (ref 150–400)
RBC: 5.09 MIL/uL (ref 4.22–5.81)
RDW: 12.2 % (ref 11.5–15.5)
WBC: 4.9 10*3/uL (ref 4.0–10.5)
nRBC: 0 % (ref 0.0–0.2)

## 2024-03-25 LAB — PRO BRAIN NATRIURETIC PEPTIDE: Pro Brain Natriuretic Peptide: 227 pg/mL (ref <300.0)

## 2024-03-25 LAB — TROPONIN T, HIGH SENSITIVITY: Troponin T High Sensitivity: 15 ng/L (ref <19)

## 2024-03-25 MED ORDER — SODIUM CHLORIDE 0.9 % IV BOLUS
500.0000 mL | Freq: Once | INTRAVENOUS | Status: AC
  Administered 2024-03-25: 500 mL via INTRAVENOUS

## 2024-03-25 MED ORDER — METOPROLOL TARTRATE 25 MG PO TABS
12.5000 mg | ORAL_TABLET | Freq: Two times a day (BID) | ORAL | 0 refills | Status: DC
  Filled 2024-03-25: qty 30, 30d supply, fill #0

## 2024-03-25 MED ORDER — METOPROLOL TARTRATE 25 MG PO TABS
12.5000 mg | ORAL_TABLET | Freq: Once | ORAL | Status: AC
  Administered 2024-03-25: 12.5 mg via ORAL
  Filled 2024-03-25: qty 1

## 2024-03-25 MED ORDER — DILTIAZEM HCL 25 MG/5ML IV SOLN
15.0000 mg | Freq: Once | INTRAVENOUS | Status: AC
  Administered 2024-03-25: 15 mg via INTRAVENOUS
  Filled 2024-03-25: qty 5

## 2024-03-25 NOTE — ED Triage Notes (Signed)
 Pt started having SOB and dizziness last night/this am and having irregular heart beat readings on a device at home. Hx afib

## 2024-03-25 NOTE — ED Provider Notes (Addendum)
 [facility] AT [facility] Provider Note   CSN: [id number] Arrival date & time: 03/25/24  0846     History  Chief Complaint  Patient presents with   Shortness of Breath   Irregular Heart Beat    Darin Garcia is a 63 y.o. male.  Here with shortness of breath and atrial fibrillation with rapid rate.  History of the same.  He is status post ablation.  He is not on any rate control medicine as his baseline heart rate is in the 50s.  He noticed maybe last night that his heart rate was fast.  He works out often does not have chest pain with it.  But maybe does not stay as well-hydrated as he should.  He is on Xarelto .  He has a history of blood clot as well.  He denies any fever chills nausea vomiting diarrhea.  He supposed to take metoprolol  as needed for this but he states that his prescription was old and it went to take it.  Denies any weakness numbness tingling.  The history is provided by the patient.       Home Medications Prior to Admission medications   Medication Sig Start Date End Date Taking? Authorizing Provider  metoprolol  tartrate (LOPRESSOR ) 25 MG tablet Take 0.5 tablets (12.5 mg total) by mouth 2 (two) times daily. 03/25/24  Yes Glada Wickstrom, DO  acetaminophen  (TYLENOL ) 325 MG tablet Take 2 tablets (650 mg total) by mouth every 6 (six) hours as needed. 01/22/23   Adolph Hoop, PA-C  Acetaminophen -guaiFENesin (MUCINEX COLD & FLU PO) Take by mouth.    [provider]  cetirizine  (ZYRTEC  ALLERGY ) 10 MG tablet Take 1 tablet (10 mg total) by mouth daily. 09/22/23   Adolph Hoop, PA-C  promethazine -dextromethorphan (PROMETHAZINE -DM) 6.25-15 MG/5ML syrup Take 5 mLs by mouth 3 (three) times daily as needed for cough. 09/22/23   Adolph Hoop, PA-C  rivaroxaban  (XARELTO ) 10 MG TABS tablet Take 1 tablet (10 mg total) by mouth daily. 11/18/23   Camnitz, Babetta Lesch, MD      Allergies    Patient has no known allergies.    Review of Systems    Review of Systems  Physical Exam Updated Vital Signs BP 117/76   Pulse 77   Temp 97.6 F (36.4 C) (Oral)   Resp 16   Ht 6\' 3"  (1.905 m)   Wt 99.8 kg   SpO2 98%   BMI 27.50 kg/m  Physical Exam Vitals and nursing note reviewed.  Constitutional:      General: He is not in acute distress.    Appearance: He is well-developed. He is not ill-appearing.  HENT:     Head: Normocephalic and atraumatic.     Mouth/Throat:     Mouth: Mucous membranes are moist.  Eyes:     Extraocular Movements: Extraocular movements intact.     Conjunctiva/sclera: Conjunctivae normal.     Pupils: Pupils are equal, round, and reactive to light.  Cardiovascular:     Rate and Rhythm: Normal rate and regular rhythm.     Pulses: Normal pulses.     Heart sounds: Normal heart sounds. No murmur heard. Pulmonary:     Effort: Pulmonary effort is normal. No respiratory distress.     Breath sounds: Normal breath sounds. No decreased breath sounds.  Abdominal:     Palpations: Abdomen is soft.     Tenderness: There is no abdominal tenderness.  Musculoskeletal:        General: No swelling.  Normal range of motion.     Cervical back: Normal range of motion and neck supple.     Right lower leg: No edema.     Left lower leg: No edema.  Skin:    General: Skin is warm and dry.     Capillary Refill: Capillary refill takes less than 2 seconds.  Neurological:     Mental Status: He is alert.  Psychiatric:        Mood and Affect: Mood normal.     ED Results / Procedures / Treatments   Labs (all labs ordered are listed, but only abnormal results are displayed) Labs Reviewed  CBC WITH DIFFERENTIAL/PLATELET  BASIC METABOLIC PANEL WITH GFR  PRO BRAIN NATRIURETIC PEPTIDE  TROPONIN T, HIGH SENSITIVITY    EKG EKG Interpretation Date/Time:  Saturday Mar 25 2024 08:52:35 EDT Ventricular Rate:  124 PR Interval:    QRS Duration:  79 QT Interval:  338 QTC Calculation: 466 R Axis:   256  Text  Interpretation: Atrial fibrillation Left anterior fascicular block Consider right ventricular hypertrophy Confirmed by Lowery Rue [phone]) on 03/25/2024 8:55:01 AM  Radiology DG Chest Portable 1 View Result Date: 03/25/2024 CLINICAL DATA:  Shortness of breath, dizziness, atrial fibrillation EXAM: PORTABLE CHEST 1 VIEW COMPARISON:  03/05/2022 FINDINGS: The heart size and mediastinal contours are within normal limits. Both lungs are clear. The visualized skeletal structures are unremarkable except for degenerative changes throughout the spine. IMPRESSION: Stable exam.  No acute chest process by plain radiography Electronically Signed   By: Melven Stable.  Shick M.D.   On: 03/25/2024 09:28    Procedures Procedures    Medications Ordered in ED Medications  diltiazem (CARDIZEM) injection 15 mg (15 mg Intravenous Given 03/25/24 0922)  sodium chloride  0.9 % bolus 500 mL (0 mLs Intravenous Stopped 03/25/24 0957)  metoprolol  tartrate (LOPRESSOR ) tablet 12.5 mg (12.5 mg Oral Given 03/25/24 6045)    ED Course/ Medical Decision Making/ A&P                                 Medical Decision Making Amount and/or Complexity of Data Reviewed Labs: ordered. Radiology: ordered.  Risk Prescription drug management.   [name] is here with palpitations.  Patient arrives with A-fib with RVR on EKG.  Heart rate in the 120s.  He has a history of A-fib he is on Xarelto  for PE and A-fib.  He is not on any rate control medication because he has a baseline slow heart rate in the 50s.  He had ablation a couple years ago and has been pretty stable since.  He has metoprolol  to take as needed but he thought the medication was too old and did not want to try to take it.  He denies any illness or other triggers possibly for this.  Does workout fairly vigorously.  Sometimes he can be dehydrated.  Overall we will check basic labs will get chest x-ray.  Do not see any signs of volume overload on exam.  I doubt this is heart failure  related.  Will give him a dose of IV diltiazem and see how he responds as his heart rates mostly between 90 and 120.  We talked about cardioversion as a possible option but my hope is that he will have some improvement with diltiazem we can put him back on maybe some oral metoprolol  as he is done in the past and if he notices that he  converts he can hold on that medication.  He supposed to take metoprolol  25 mg every 6 hours as needed for breakthrough A-fib.  Overall we will reevaluate.  Overall heart rates come down to the 70s but still in A-fib.  Lab work per my review and interpretation is unremarkable.  No significant leukocytosis anemia or electrolyte abnormality.  Troponin BNP normal.  Chest x-ray without signs of volume overload or pneumonia per my review interpretation.  Overall looks like he used to be on 25 every 6 but his heart rates already in the 70s following diltiazem IV.  I gave patient 12.5 of Lopressor  we will have him continue 12.5 twice daily and follow-up in A-fib clinic.  His rate is very controlled at this time.  I did instruct him that he could take an additional 12.5 twice a day as needed for heart rates greater than 125.  Told to return if symptoms worsen.  Discharged in good condition.  We did discuss cardioversion but he like to avoid that for now.  This chart was dictated using voice recognition software.  Despite best efforts to proofread,  errors can occur which can change the documentation meaning.         Final Clinical Impression(s) / ED Diagnoses Final diagnoses:  Atrial fibrillation with rapid ventricular response (HCC)    Rx / DC Orders ED Discharge Orders          Ordered    metoprolol  tartrate (LOPRESSOR ) 25 MG tablet  2 times daily        03/25/24 1036    Amb Referral to AFIB Clinic        03/25/24 1036              Rohit Deloria, DO 03/25/24 1038    Adylee Leonardo, DO 03/25/24 1038

## 2024-03-25 NOTE — Discharge Instructions (Signed)
 Follow-up in A-fib clinic.  I recommend taking metoprolol  12.5 mg twice daily.  You can take an additional 12.5 mg up to twice a day for heart rates greater than 125 as we discussed.  Return if symptoms worsen.

## 2024-03-25 NOTE — ED Notes (Signed)
 AVS given.Health teaching done.Questions answered satisfactorily.Discharged ambulatory with significant others.

## 2024-03-31 ENCOUNTER — Ambulatory Visit (HOSPITAL_COMMUNITY)
Admission: RE | Admit: 2024-03-31 | Discharge: 2024-03-31 | Disposition: A | Discharge status: Home / Self Care | Source: Ambulatory Visit | Attending: Physician Assistant | Admitting: Physician Assistant

## 2024-03-31 ENCOUNTER — Other Ambulatory Visit (HOSPITAL_BASED_OUTPATIENT_CLINIC_OR_DEPARTMENT_OTHER): Payer: Self-pay

## 2024-03-31 ENCOUNTER — Other Ambulatory Visit (HOSPITAL_COMMUNITY): Payer: Self-pay | Admitting: Physician Assistant

## 2024-03-31 ENCOUNTER — Encounter (HOSPITAL_COMMUNITY): Payer: Self-pay | Admitting: Physician Assistant

## 2024-03-31 VITALS — BP 128/90 | HR 49 | Ht 75.0 in | Wt 228.0 lb

## 2024-03-31 DIAGNOSIS — I480 Paroxysmal atrial fibrillation: Secondary | ICD-10-CM

## 2024-03-31 DIAGNOSIS — R001 Bradycardia, unspecified: Secondary | ICD-10-CM

## 2024-03-31 MED ORDER — METOPROLOL TARTRATE 25 MG PO TABS
12.5000 mg | ORAL_TABLET | Freq: Two times a day (BID) | ORAL | 1 refills | Status: AC | PRN
  Filled 2024-03-31: qty 30, 30d supply, fill #0

## 2024-03-31 NOTE — Progress Notes (Signed)
 Primary Care Physician: Lieutenant Reese., MD Primary Cardiologist: Dr Renna Cary Primary Electrophysiologist: Dr Lawana Pray  Referring Physician: Dr Dimple Francis is a 63 y.o. male with a history of typical atrial flutter s/p ablation, recurrent DVT and pulmonary embolism, cardiomyopathy (EF is 45-50% in the setting of rapidly conducting atrial flutter) who presents for follow up in the [facility]. Patient has a CHADS2VASC score of 1. He is s/p flutter ablation x 2 in 2020. Patient was seen at the ED 04/27/21 with tachypalpitations and dizziness. ECG showed rapid afib on arrival but he converted to SR spontaneously. He had another episode 04/30/21 which lasted for several hours. There were no specific triggers that he could identify. Patient is s/p afib ablation 07/17/21 with Dr Nunzio Belch.   Patient returns for follow up for atrial fibrillation. Patient presented to the ED 03/25/24 with symptoms of tachypalpitations. ECG showed afib with RVR and he was started on scheduled BB. He only took one dose but discontinued due to dizziness. He is bradycardic today with intermittent dizziness. He reports that he is able to get his HR up during exercise to 80s-90s bpm but once he stops exercising he rate drops to the 40s again and he becomes dizzy.   Today, he  denies symptoms of palpitations, chest pain, shortness of breath, orthopnea, PND, lower extremity edema, presyncope, syncope, snoring, daytime somnolence, bleeding, or neurologic sequela. The patient is tolerating medications without difficulties and is otherwise without complaint today.    Atrial Fibrillation Risk Factors:  he does not have symptoms or diagnosis of sleep apnea.  Negative sleep study. he does not have a history of rheumatic fever. he does not have a history of alcohol use.   Atrial Fibrillation Management history:  Previous antiarrhythmic drugs: none Previous cardioversions: 11/25/18 Previous  ablations: 04/04/19 flutter, 08/15/19 flutter, 07/17/21 Anticoagulation history: Xarelto     Past Medical History:  Diagnosis Date   Atrial flutter (HCC)    a. s/p TEE DCCV 11/2018.   Cardiomyopathy (HCC)    Dyspnea    GERD (gastroesophageal reflux disease)    History of DVT (deep vein thrombosis)    Pulmonary embolism (HCC)     ROS- All systems are reviewed and negative except as per the HPI above.  Physical Exam: Vitals:   03/31/24 1021  BP: (!) 128/90  Pulse: (!) 49  Weight: 103.4 kg  Height: 6\' 3"  (1.905 m)     GEN: Well nourished, well developed in no acute distress CARDIAC: Regular rate and rhythm, bradycardic, no murmurs, rubs, gallops RESPIRATORY:  Clear to auscultation without rales, wheezing or rhonchi  ABDOMEN: Soft, non-tender, non-distended EXTREMITIES:  No edema; No deformity    Wt Readings from Last 3 Encounters:  03/31/24 103.4 kg  03/25/24 99.8 kg  06/18/23 118.9 kg    EKG today demonstrates  Junctional rhythm Vent. rate 49 BPM PR interval * ms QRS duration 78 ms QT/QTcB 454/410 ms   Echo 12/28/19 demonstrated   1. Left ventricular ejection fraction, by visual estimation, is 60 to  65%. The left ventricle has normal function. There is mildly increased left ventricular hypertrophy.   2. Left ventricular diastolic parameters are consistent with Grade I  diastolic dysfunction (impaired relaxation).   3. The left ventricle has no regional wall motion abnormalities.   4. Global right ventricle has normal systolic function.The right  ventricular size is normal. No increase in right ventricular wall  thickness.   5. Left atrial size was  normal.   6. Right atrial size was normal.   7. The mitral valve is normal in structure. No evidence of mitral valve  regurgitation. No evidence of mitral stenosis.   8. The tricuspid valve is normal in structure.   9. The tricuspid valve is normal in structure. Tricuspid valve  regurgitation is mild.  10. The aortic  valve is normal in structure. Aortic valve regurgitation is not visualized. No evidence of aortic valve sclerosis or stenosis.  11. The pulmonic valve was normal in structure. Pulmonic valve  regurgitation is not visualized.  12. Normal pulmonary artery systolic pressure.  13. The inferior vena cava is normal in size with greater than 50%  respiratory variability, suggesting right atrial pressure of 3 mmHg.   Epic records are reviewed at length today   CHA2DS2-VASc Score = 1  The patient's score is based upon: CHF History: 0 HTN History: 0 Diabetes History: 0 Stroke History: 0 Vascular Disease History: 1 Age Score: 0 Gender Score: 0       ASSESSMENT AND PLAN: Paroxysmal Atrial Fibrillation/atrial flutter The patient's CHA2DS2-VASc score is 1, indicating a 0.6% annual risk of stroke.   S/p typical flutter ablation 04/04/19 and 08/15/19, afib ablation 07/17/21 Patient spontaneously converted.  Continue Lopressor  12.5 mg BID PRN for heart racing. Would not use regularly given symptomatic bradycardia.  Continue Xarelto  10 mg daily mg daily (h/o DVT/PE)  HFrecEF EF normalized with SR Fluid status appears stable today  CAD CAC score 240, FFR negative No anginal symptoms  Symptomatic bradycardia Patient has had bradycardia in the past but is now symptomatic with dizziness. D/w EP, will order TST to evaluate for chronotropic incompetence. He will also follow up with Dr Lawana Pray at next available appointment. We briefly discussed that he may require pacing.     Follow up with Dr Lawana Pray.    Informed Consent   Shared Decision Making/Informed Consent The risks [chest pain, shortness of breath, cardiac arrhythmias, dizziness, blood pressure fluctuations, myocardial infarction, stroke/transient ischemic attack, and life-threatening complications (estimated to be 1 in 10,000)], benefits (risk stratification, diagnosing coronary artery disease, treatment guidance) and alternatives of an  exercise tolerance test were discussed in detail with Mr. Klontz and he agrees to proceed.       Myrtha Ates PA-C Afib Clinic [facility] [address] [place], [place] [address] [phone] 03/31/2024 11:58 AM

## 2024-03-31 NOTE — Patient Instructions (Signed)
 Change metoprolol  to 1/2 tablet twice a day AS NEEDED for breakthrough afib   Scheduling will call for treadmill stress test

## 2024-04-03 ENCOUNTER — Encounter: Payer: Self-pay | Admitting: Cardiology

## 2024-04-03 ENCOUNTER — Ambulatory Visit: Attending: Cardiology | Admitting: Cardiology

## 2024-04-03 ENCOUNTER — Other Ambulatory Visit: Payer: Self-pay

## 2024-04-03 VITALS — BP 110/74 | HR 44 | Ht 75.0 in | Wt 220.0 lb

## 2024-04-03 DIAGNOSIS — I480 Paroxysmal atrial fibrillation: Secondary | ICD-10-CM | POA: Diagnosis not present

## 2024-04-03 DIAGNOSIS — D6869 Other thrombophilia: Secondary | ICD-10-CM | POA: Diagnosis not present

## 2024-04-03 NOTE — Progress Notes (Signed)
 Electrophysiology Office Note:   Date:  04/03/2024  ID:  Darin Garcia, DOB 01-29-1961, MRN [id number]  Primary Cardiologist: Dorothye Gathers, MD Primary Heart Failure: None Electrophysiologist: Ireanna Finlayson Cortland Ding, MD      History of Present Illness:   Darin Garcia is a 63 y.o. male with h/o atrial flutter post ablation, DVT, PE seen today for routine electrophysiology followup.   He had atrial flutter ablation x 2 in 2020.  He was seen in emergency room 04/27/2021 with palpitations and dizziness.  He was found to be in rapid atrial fibrillation but converted to sinus rhythm.  He is now post atrial fibrillation ablation 07/17/2021.  He had another episode of atrial fibrillation 01/26/2024 where he presented to the emergency room with palpitations.    Since last being seen in our clinic the patient reports doing since then, he has continued to have issues with shortness of breath and fatigue.  He has a Kardia mobile and is noted that his heart rates have been slow, at times in the 40s.  He can exercise, and his shortness of breath and fatigue occur mainly when he is at rest.  He does think that he is continue to have episodes of atrial fibrillation.  he denies chest pain, palpitations, dyspnea, PND, orthopnea, nausea, vomiting, dizziness, syncope, edema, weight gain, or early satiety.   Review of systems complete and found to be negative unless listed in HPI.   EP Information / Studies Reviewed:    EKG is not ordered today. EKG from 03/31/24 reviewed which showed sinus rhythm        Risk Assessment/Calculations:    CHA2DS2-VASc Score = 1   This indicates a 0.6% annual risk of stroke. The patient's score is based upon: CHF History: 0 HTN History: 0 Diabetes History: 0 Stroke History: 0 Vascular Disease History: 1 Age Score: 0 Gender Score: 0             Physical Exam:   VS:  There were no vitals taken for this visit.   Wt Readings from Last 3 Encounters:  03/31/24 228 lb (103.4 kg)   03/25/24 220 lb (99.8 kg)  06/18/23 262 lb 3.2 oz (118.9 kg)     GEN: Well nourished, well developed in no acute distress NECK: No JVD; No carotid bruits CARDIAC: Regular rate and rhythm, no murmurs, rubs, gallops RESPIRATORY:  Clear to auscultation without rales, wheezing or rhonchi  ABDOMEN: Soft, non-tender, non-distended EXTREMITIES:  No edema; No deformity   ASSESSMENT AND PLAN:    1.  Paroxysmal atrial fibrillation/flutter: Flutter ablation x 2 both in 2020.  Atrial fibrillation ablation 07/17/2021.  He is unfortunately had more frequent episodes of atrial fibrillation.  He is also bradycardic and thus rate controlling medications would be quite difficult.  He would like to avoid long-term antiarrhythmics for treatment of his atrial fibrillation.  Due to that, we Shunte Senseney plan for ablation.  Risk, benefits, and alternatives to EP study and radiofrequency/pulse field ablation for afib were also discussed in detail today. These risks include but are not limited to stroke, bleeding, vascular damage, tamponade, perforation, damage to the esophagus, lungs, and other structures, pulmonary vein stenosis, worsening renal function, and death. The patient understands these risk and wishes to proceed.  We Kynadi Dragos therefore proceed with catheter ablation at the next available time.  Carto, ICE, anesthesia are requested for the procedure.  Xyler Terpening also obtain CT PV protocol prior to the procedure to exclude LAA thrombus and further evaluate atrial anatomy.  2.  Secondary hypercoagulable state: On Xarelto  for recurrent DVT/PE  3.  Chronic systolic heart failure: Normalized ejection fraction  4.  Coronary artery disease: Elevated calcium score but FFR negative.  No current angina  Follow up with Afib Clinic as usual post procedure  Signed, Analyssa Downs Cortland Ding, MD

## 2024-04-03 NOTE — Patient Instructions (Addendum)
 Medication Instructions:  Your physician recommends that you continue on your current medications as directed. Please refer to the Current Medication list given to you today.  *If you need a refill on your cardiac medications before your next appointment, please call your pharmacy*  Lab Work: BMET and CBC - you may go to any [facility] location to have these drawn within 30 days of your procedure  Testing/Procedures: Cardiac CT Your physician has requested that you have cardiac CT. Cardiac computed tomography (CT) is a painless test that uses an x-ray machine to take clear, detailed pictures of your heart. For further information please visit [email or web address] We will call you to schedule your CT scan. It will be done about three weeks prior to your ablation.  Ablation Your physician has recommended that you have an ablation. Catheter ablation is a medical procedure used to treat some cardiac arrhythmias (irregular heartbeats). During catheter ablation, a long, thin, flexible tube is put into a blood vessel in your groin (upper thigh), or neck. This tube is called an ablation catheter. It is then guided to your heart through the blood vessel. Radio frequency waves destroy small areas of heart tissue where abnormal heartbeats may cause an arrhythmia to start.  You are scheduled for Atrial Fibrillation Ablation on Wednesday, August 13 with Dr. Agatha Horsfall.Please arrive at the Main Entrance A at [facility]: [address] [place], [place] [address] at 11:30 AM   Follow-Up: At [facility], you and your health needs are our priority.  As part of our continuing mission to provide you with exceptional heart care, we have created designated Provider Care Teams.  These Care Teams include your primary Cardiologist (physician) and Advanced Practice Providers (APPs -  Physician Assistants and Nurse Practitioners) who all work together to provide you with the care you need, when you need it.    Your next appointment:   We will contact you about your post-procedure follow up appointments.

## 2024-04-05 ENCOUNTER — Other Ambulatory Visit (HOSPITAL_COMMUNITY): Payer: Self-pay

## 2024-04-05 DIAGNOSIS — Z5181 Encounter for therapeutic drug level monitoring: Secondary | ICD-10-CM

## 2024-04-05 DIAGNOSIS — I480 Paroxysmal atrial fibrillation: Secondary | ICD-10-CM

## 2024-04-20 ENCOUNTER — Telehealth (HOSPITAL_COMMUNITY): Payer: Self-pay | Admitting: *Deleted

## 2024-04-20 NOTE — Telephone Encounter (Signed)
 Left reminder message for upcoming ETT on 04/28/24 at 9:30.

## 2024-04-28 ENCOUNTER — Ambulatory Visit (HOSPITAL_COMMUNITY)
Admission: RE | Admit: 2024-04-28 | Discharge: 2024-04-28 | Disposition: A | Discharge status: Home / Self Care | Source: Ambulatory Visit | Attending: Internal Medicine | Admitting: Internal Medicine

## 2024-04-28 ENCOUNTER — Ambulatory Visit (HOSPITAL_COMMUNITY): Payer: Self-pay | Admitting: Physician Assistant

## 2024-04-28 DIAGNOSIS — Z5181 Encounter for therapeutic drug level monitoring: Secondary | ICD-10-CM | POA: Insufficient documentation

## 2024-04-28 DIAGNOSIS — I480 Paroxysmal atrial fibrillation: Secondary | ICD-10-CM | POA: Insufficient documentation

## 2024-04-28 DIAGNOSIS — Z79899 Other long term (current) drug therapy: Secondary | ICD-10-CM | POA: Diagnosis present

## 2024-04-28 LAB — EXERCISE TOLERANCE TEST
Angina Index: 0
Base ST Depression (mm): 0 mm
Duke Treadmill Score: 12
Estimated workload: 13.4
Exercise duration (min): 12 min
Exercise duration (sec): 0 s
MPHR: 159 {beats}/min
Peak HR: 150 {beats}/min
Percent HR: 94 %
RPE: 18
Rest HR: 51 {beats}/min
ST Depression (mm): 0 mm

## 2024-06-14 ENCOUNTER — Other Ambulatory Visit (HOSPITAL_BASED_OUTPATIENT_CLINIC_OR_DEPARTMENT_OTHER): Payer: Self-pay

## 2024-06-14 ENCOUNTER — Ambulatory Visit (HOSPITAL_COMMUNITY)
Admission: RE | Admit: 2024-06-14 | Discharge: 2024-06-14 | Disposition: A | Discharge status: Home / Self Care | Source: Ambulatory Visit | Attending: Cardiology | Admitting: Cardiology

## 2024-06-14 DIAGNOSIS — I480 Paroxysmal atrial fibrillation: Secondary | ICD-10-CM | POA: Insufficient documentation

## 2024-06-14 LAB — CBC

## 2024-06-14 MED ORDER — IOHEXOL 350 MG/ML SOLN
100.0000 mL | Freq: Once | INTRAVENOUS | Status: AC | PRN
  Administered 2024-06-14: 100 mL via INTRAVENOUS

## 2024-06-14 MED ORDER — RIVAROXABAN 20 MG PO TABS
20.0000 mg | ORAL_TABLET | Freq: Every day | ORAL | 3 refills | Status: DC
  Filled 2024-06-14: qty 90, 90d supply, fill #0
  Filled 2024-06-15: qty 30, 30d supply, fill #0
  Filled 2024-06-15 – 2024-06-16 (×2): qty 90, 90d supply, fill #0

## 2024-06-15 ENCOUNTER — Other Ambulatory Visit: Payer: Self-pay

## 2024-06-15 ENCOUNTER — Other Ambulatory Visit (HOSPITAL_BASED_OUTPATIENT_CLINIC_OR_DEPARTMENT_OTHER): Payer: Self-pay

## 2024-06-15 LAB — BASIC METABOLIC PANEL WITH GFR
BUN/Creatinine Ratio: 18 (ref 10–24)
BUN: 17 mg/dL (ref 8–27)
CO2: 20 mmol/L (ref 20–29)
Calcium: 9.1 mg/dL (ref 8.6–10.2)
Chloride: 104 mmol/L (ref 96–106)
Creatinine, Ser: 0.96 mg/dL (ref 0.76–1.27)
Glucose: 74 mg/dL (ref 70–99)
Potassium: 4.5 mmol/L (ref 3.5–5.2)
Sodium: 139 mmol/L (ref 134–144)
eGFR: 90 mL/min/1.73 (ref >59)

## 2024-06-15 LAB — CBC
Hematocrit: 47.2 (ref 37.5–51.0)
Hemoglobin: 15 g/dL (ref 13.0–17.7)
MCH: 30.4 pg (ref 26.6–33.0)
MCHC: 31.8 g/dL (ref 31.5–35.7)
MCV: 96 fL (ref 79–97)
Platelets: 253 x10E3/uL (ref 150–450)
RBC: 4.93 x10E6/uL (ref 4.14–5.80)
RDW: 12.1 (ref 11.6–15.4)
WBC: 5 x10E3/uL (ref 3.4–10.8)

## 2024-06-16 ENCOUNTER — Other Ambulatory Visit: Payer: Self-pay

## 2024-06-16 ENCOUNTER — Telehealth: Payer: Self-pay | Admitting: Cardiology

## 2024-06-16 ENCOUNTER — Other Ambulatory Visit (HOSPITAL_BASED_OUTPATIENT_CLINIC_OR_DEPARTMENT_OTHER): Payer: Self-pay

## 2024-06-16 ENCOUNTER — Ambulatory Visit: Payer: Self-pay

## 2024-06-16 ENCOUNTER — Other Ambulatory Visit (HOSPITAL_COMMUNITY): Payer: Self-pay

## 2024-06-16 MED ORDER — RIVAROXABAN 20 MG PO TABS
20.0000 mg | ORAL_TABLET | Freq: Every day | ORAL | 3 refills | Status: DC
  Filled 2024-09-11: qty 90, 90d supply, fill #0

## 2024-06-16 NOTE — Telephone Encounter (Signed)
 Prescription refill request for Xarelto  received.  Indication:afib Last office visit:5/25 Weight:99.8  kg Age:63 Scr:0.96  7/25 CrCl:114.07  ml/min  Prescription refilled

## 2024-06-16 NOTE — Telephone Encounter (Signed)
*  STAT* If patient is at the pharmacy, call can be transferred to refill team.   1. Which medications need to be refilled? (please list name of each medication and dose if known)   rivaroxaban  (XARELTO ) 20 MG TABS tablet    2. Which pharmacy/location (including street and city if local pharmacy) is medication to be sent to?  [place] [facility]      3. Do they need a 30 day or 90 day supply? 90 day    Pt stated he was contacted stating the requested pharmacy couldn't fill this medication requesting since the dosage increase being done so he still hasn't received it after confirming through MyChart message he received about confirming it's okay to send to pharmacy listed above.

## 2024-06-17 ENCOUNTER — Other Ambulatory Visit (HOSPITAL_COMMUNITY): Payer: Self-pay

## 2024-06-19 ENCOUNTER — Other Ambulatory Visit (HOSPITAL_BASED_OUTPATIENT_CLINIC_OR_DEPARTMENT_OTHER): Payer: Self-pay

## 2024-06-28 ENCOUNTER — Telehealth (HOSPITAL_COMMUNITY): Payer: Self-pay

## 2024-06-28 NOTE — Telephone Encounter (Signed)
 Attempted to reach patient to discuss upcoming procedure, no answer. Left VM for patient to return call.

## 2024-07-03 NOTE — Telephone Encounter (Signed)
 Patient returned call to discuss upcoming procedure.   CT: completed.  Labs: completed.   Any recent signs of acute illness or been started on antibiotics? No Any new medications started? No Any medications to hold?  No Any missed doses of blood thinner? No Advised patient to continue taking ANTICOAGULANT: Xarelto  (Rivaroxaban ) daily without missing any doses.  Medication instructions:  On the morning of your procedure DO NOT take any medication., including Xarelto  or the procedure may be rescheduled. Nothing to eat or drink after midnight prior to your procedure.  Confirmed patient is scheduled for Atrial Fibrillation Ablation on Wednesday, August 13 with Dr. Soyla Norton. Instructed patient to arrive at the Main Entrance A at [facility]: [address] [place], [place] [address] and check in at Admitting at 11:30 AM.  Advised of plan to go home the same day and will only stay overnight if medically necessary. You MUST have a responsible adult to drive you home and MUST be with you the first 24 hours after you arrive home or your procedure could be cancelled.  Patient verbalized understanding to all instructions provided and agreed to proceed with procedure.

## 2024-07-05 ENCOUNTER — Ambulatory Visit (HOSPITAL_COMMUNITY): Admitting: Anesthesiology

## 2024-07-05 ENCOUNTER — Encounter (HOSPITAL_COMMUNITY)
Admission: RE | Disposition: A | Discharge status: Home / Self Care | Payer: Self-pay | Source: Home / Self Care | Attending: Cardiology

## 2024-07-05 ENCOUNTER — Ambulatory Visit (HOSPITAL_COMMUNITY)
Admission: RE | Admit: 2024-07-05 | Discharge: 2024-07-05 | Disposition: A | Discharge status: Home / Self Care | Attending: Cardiology | Admitting: Cardiology

## 2024-07-05 ENCOUNTER — Other Ambulatory Visit: Payer: Self-pay

## 2024-07-05 DIAGNOSIS — Z79899 Other long term (current) drug therapy: Secondary | ICD-10-CM | POA: Diagnosis not present

## 2024-07-05 DIAGNOSIS — I4892 Unspecified atrial flutter: Secondary | ICD-10-CM | POA: Diagnosis not present

## 2024-07-05 DIAGNOSIS — I509 Heart failure, unspecified: Secondary | ICD-10-CM | POA: Diagnosis not present

## 2024-07-05 DIAGNOSIS — Z87891 Personal history of nicotine dependence: Secondary | ICD-10-CM | POA: Diagnosis not present

## 2024-07-05 DIAGNOSIS — Z86711 Personal history of pulmonary embolism: Secondary | ICD-10-CM | POA: Insufficient documentation

## 2024-07-05 DIAGNOSIS — I4891 Unspecified atrial fibrillation: Secondary | ICD-10-CM | POA: Diagnosis not present

## 2024-07-05 DIAGNOSIS — I429 Cardiomyopathy, unspecified: Secondary | ICD-10-CM | POA: Diagnosis not present

## 2024-07-05 DIAGNOSIS — K219 Gastro-esophageal reflux disease without esophagitis: Secondary | ICD-10-CM | POA: Insufficient documentation

## 2024-07-05 DIAGNOSIS — I480 Paroxysmal atrial fibrillation: Secondary | ICD-10-CM

## 2024-07-05 DIAGNOSIS — Z86718 Personal history of other venous thrombosis and embolism: Secondary | ICD-10-CM | POA: Insufficient documentation

## 2024-07-05 HISTORY — PX: ATRIAL FIBRILLATION ABLATION: EP1191

## 2024-07-05 LAB — POCT ACTIVATED CLOTTING TIME: Activated Clotting Time: 285 s

## 2024-07-05 MED ORDER — SODIUM CHLORIDE 0.9% FLUSH
3.0000 mL | INTRAVENOUS | Status: DC | PRN
Start: 2024-07-05 — End: 2024-07-05

## 2024-07-05 MED ORDER — HEPARIN SODIUM (PORCINE) 1000 UNIT/ML IJ SOLN
INTRAMUSCULAR | Status: DC | PRN
  Administered 2024-07-05: 5000 [IU] via INTRAVENOUS
  Administered 2024-07-05 (×2): 14000 [IU] via INTRAVENOUS
  Administered 2024-07-05: 5000 [IU] via INTRAVENOUS

## 2024-07-05 MED ORDER — PROPOFOL 1000 MG/100ML IV EMUL
INTRAVENOUS | Status: AC
  Filled 2024-07-05: qty 100

## 2024-07-05 MED ORDER — ONDANSETRON HCL 4 MG/2ML IJ SOLN
INTRAMUSCULAR | Status: DC | PRN
  Administered 2024-07-05 (×2): 4 mg via INTRAVENOUS

## 2024-07-05 MED ORDER — ACETAMINOPHEN 500 MG PO TABS
ORAL_TABLET | ORAL | Status: AC
  Administered 2024-07-05 (×2): 1000 mg via ORAL
  Filled 2024-07-05: qty 2

## 2024-07-05 MED ORDER — PROTAMINE SULFATE 10 MG/ML IV SOLN
INTRAVENOUS | Status: DC | PRN
  Administered 2024-07-05 (×2): 40 mg via INTRAVENOUS

## 2024-07-05 MED ORDER — SODIUM CHLORIDE 0.9 % IV SOLN: INTRAVENOUS | Status: DC

## 2024-07-05 MED ORDER — HEPARIN (PORCINE) IN NACL 1000-0.9 UT/500ML-% IV SOLN
INTRAVENOUS | Status: DC | PRN
  Administered 2024-07-05 (×6): 500 mL

## 2024-07-05 MED ORDER — ONDANSETRON HCL 4 MG/2ML IJ SOLN: 4.0000 mg | Freq: Four times a day (QID) | INTRAMUSCULAR | Status: DC | PRN

## 2024-07-05 MED ORDER — PROPOFOL 500 MG/50ML IV EMUL
INTRAVENOUS | Status: DC | PRN
  Administered 2024-07-05 (×2): 70 ug/kg/min via INTRAVENOUS

## 2024-07-05 MED ORDER — PROPOFOL 10 MG/ML IV BOLUS
INTRAVENOUS | Status: DC | PRN
  Administered 2024-07-05: 100 mg via INTRAVENOUS
  Administered 2024-07-05 (×5): 50 mg via INTRAVENOUS
  Administered 2024-07-05: 100 mg via INTRAVENOUS
  Administered 2024-07-05 (×3): 50 mg via INTRAVENOUS

## 2024-07-05 MED ORDER — LIDOCAINE 2% (20 MG/ML) 5 ML SYRINGE
INTRAMUSCULAR | Status: DC | PRN
  Administered 2024-07-05 (×2): 60 mg via INTRAVENOUS

## 2024-07-05 MED ORDER — ACETAMINOPHEN 325 MG PO TABS: 650.0000 mg | ORAL_TABLET | ORAL | Status: DC | PRN

## 2024-07-05 MED ORDER — MIDAZOLAM HCL 2 MG/2ML IJ SOLN
INTRAMUSCULAR | Status: AC
  Filled 2024-07-05: qty 2

## 2024-07-05 MED ORDER — DEXAMETHASONE SODIUM PHOSPHATE 10 MG/ML IJ SOLN
INTRAMUSCULAR | Status: DC | PRN
  Administered 2024-07-05 (×2): 10 mg via INTRAVENOUS

## 2024-07-05 MED ORDER — FENTANYL CITRATE (PF) 250 MCG/5ML IJ SOLN
INTRAMUSCULAR | Status: DC | PRN
  Administered 2024-07-05 (×4): 50 ug via INTRAVENOUS

## 2024-07-05 MED ORDER — ROCURONIUM BROMIDE 10 MG/ML (PF) SYRINGE
PREFILLED_SYRINGE | INTRAVENOUS | Status: DC | PRN
  Administered 2024-07-05 (×4): 20 mg via INTRAVENOUS

## 2024-07-05 MED ORDER — ACETAMINOPHEN 500 MG PO TABS: 1000.0000 mg | ORAL_TABLET | Freq: Once | ORAL | Status: AC

## 2024-07-05 MED ORDER — SODIUM CHLORIDE 0.9 % IV SOLN: 250.0000 mL | INTRAVENOUS | Status: DC | PRN

## 2024-07-05 MED ORDER — ATROPINE SULFATE 1 MG/ML IV SOLN
INTRAVENOUS | Status: DC | PRN
  Administered 2024-07-05 (×2): 1 mg via INTRAVENOUS

## 2024-07-05 MED ORDER — FENTANYL CITRATE (PF) 100 MCG/2ML IJ SOLN
INTRAMUSCULAR | Status: AC
Start: 2024-07-05 — End: 2024-07-05
  Filled 2024-07-05: qty 2

## 2024-07-05 MED ORDER — MIDAZOLAM HCL 2 MG/2ML IJ SOLN
INTRAMUSCULAR | Status: DC | PRN
  Administered 2024-07-05 (×2): 2 mg via INTRAVENOUS

## 2024-07-05 MED ORDER — SUGAMMADEX SODIUM 200 MG/2ML IV SOLN
INTRAVENOUS | Status: DC | PRN
  Administered 2024-07-05 (×2): 200 mg via INTRAVENOUS

## 2024-07-05 NOTE — Progress Notes (Signed)
 Patient and wife was given discharge instructions. Both verbalized understanding.

## 2024-07-05 NOTE — Anesthesia Preprocedure Evaluation (Addendum)
 Anesthesia Evaluation  Patient identified by MRN, date of birth, ID band Patient awake    Reviewed: Allergy  & Precautions, NPO status , Patient's Chart, lab work & pertinent test results  Airway Mallampati: I  TM Distance: >3 FB Neck ROM: Full    Dental no notable dental hx. (+) Dental Advisory Given   Pulmonary shortness of breath, former smoker, PE   Pulmonary exam normal breath sounds clear to auscultation       Cardiovascular +CHF and + DVT  Normal cardiovascular exam+ dysrhythmias Atrial Fibrillation  Rhythm:Regular Rate:Normal  Echo 2022  1. Left ventricular ejection fraction, by estimation, is 55 to 60%. Left  ventricular ejection fraction by 3D volume is 55 %. The left ventricle has  normal function. The left ventricle has no regional wall motion  abnormalities. There is mild concentric left ventricular hypertrophy. Left  ventricular diastolic parameters are consistent with Grade I diastolic  dysfunction (impaired relaxation).   2. Right ventricular systolic function is normal. The right ventricular  size is normal. There is normal pulmonary artery systolic pressure.   3. The mitral valve is normal in structure. Trivial mitral valve  regurgitation. No evidence of mitral stenosis.   4. The aortic valve is tricuspid. Aortic valve regurgitation is not  visualized. No aortic stenosis is present.   5. The inferior vena cava is normal in size with greater than 50%  respiratory variability, suggesting right atrial pressure of 3 mmHg.   Comparison(s): A prior study was performed on 12/28/19. No significant  change from prior study. Prior images reviewed side by side.    Coronary CT 2022 CT FFR analysis was performed on the original cardiac computed tomography angiogram dataset. Diagrammatic representation of the CT FFR analysis is provided in a separate PDF document in PACS. This dictation was created using the PDF document  and an interactive 3D model of the results. The 3D model is not available in the EMR/PACS. Normal CT FFR range is >0.80.   1. Left Main: No significant stenosis. 2. LAD: 0.83 mid LAD 3. LCX: 0.86 mid ramus 4. RCA: No significant stenosis.   IMPRESSION: 1.  CT FFR analysis didn't show any significant stenosis.     Neuro/Psych negative neurological ROS  negative psych ROS   GI/Hepatic Neg liver ROS,GERD  ,,  Endo/Other  negative endocrine ROS    Renal/GU negative Renal ROS     Musculoskeletal negative musculoskeletal ROS (+)    Abdominal   Peds  Hematology negative hematology ROS (+)   Anesthesia Other Findings   Reproductive/Obstetrics                              Anesthesia Physical Anesthesia Plan  ASA: 3  Anesthesia Plan: General   Post-op Pain Management: Tylenol  PO (pre-op)*   Induction: Intravenous  PONV Risk Score and Plan: 2 and Treatment may vary due to age or medical condition, Ondansetron , Dexamethasone  and Midazolam   Airway Management Planned: Oral ETT  Additional Equipment: None  Intra-op Plan:   Post-operative Plan: Extubation in OR  Informed Consent: I have reviewed the patients History and Physical, chart, labs and discussed the procedure including the risks, benefits and alternatives for the proposed anesthesia with the patient or authorized representative who has indicated his/her understanding and acceptance.     Dental advisory given  Plan Discussed with: CRNA  Anesthesia Plan Comments:          Anesthesia Quick Evaluation

## 2024-07-05 NOTE — Transfer of Care (Signed)
 Immediate Anesthesia Transfer of Care Note  Patient: Darin Garcia  Procedure(s) Performed: ATRIAL FIBRILLATION ABLATION  Patient Location: PACU and Cath Lab  Anesthesia Type:General  Level of Consciousness: sedated  Airway & Oxygen Therapy: Patient Spontanous Breathing and Patient connected to nasal cannula oxygen  Post-op Assessment: Report given to RN and Post -op Vital signs reviewed and stable  Post vital signs: stable  Last Vitals:  Vitals Value Taken Time  BP    Temp    Pulse 66 07/05/24 14:46  Resp 21 07/05/24 14:46  SpO2 99 % 07/05/24 14:46  Vitals shown include unfiled device data.  Last Pain:  Vitals:   07/05/24 1139  PainSc: 0-No pain         Complications: There were no known notable events for this encounter.

## 2024-07-05 NOTE — Discharge Instructions (Signed)

## 2024-07-05 NOTE — H&P (Signed)
  Electrophysiology Office Note:   Date:  07/05/2024  ID:  Darin Garcia, DOB [birth year], MRN [id number]  Primary Cardiologist: Oneil Parchment, MD Primary Heart Failure: None Electrophysiologist: Leoda Smithhart Gladis Norton, MD      History of Present Illness:   Darin Garcia is a 63 y.o. male with h/o atrial flutter post ablation, DVT, PE seen today for routine electrophysiology followup.   He had atrial flutter ablation x 2 in 2020.  He was seen in emergency room 04/27/2021 with palpitations and dizziness.  He was found to be in rapid atrial fibrillation but converted to sinus rhythm.  He is now post atrial fibrillation ablation 07/17/2021.  He had another episode of atrial fibrillation 01/26/2024 where he presented to the emergency room with palpitations.    Today, denies symptoms of palpitations, chest pain, dyspnea, orthopnea, PND, lower extremity edema, claudication, dizziness, presyncope, syncope, bleeding, or neurologic sequela. The patient is tolerating medications without difficulties. Plan ablation today.   EP Information / Studies Reviewed:    EKG is not ordered today. EKG from 03/31/24 reviewed which showed sinus rhythm        Risk Assessment/Calculations:    CHA2DS2-VASc Score =     This indicates a  % annual risk of stroke. The patient's score is based upon:               Physical Exam:   VS:  There were no vitals taken for this visit.   Wt Readings from Last 3 Encounters:  04/03/24 99.8 kg  03/31/24 103.4 kg  03/25/24 99.8 kg    GEN: Well nourished, well developed in no acute distress NECK: No JVD; No carotid bruits CARDIAC: Regular rate and rhythm, no murmurs, rubs, gallops RESPIRATORY:  Clear to auscultation without rales, wheezing or rhonchi  ABDOMEN: Soft, non-tender, non-distended EXTREMITIES:  No edema; No deformity    ASSESSMENT AND PLAN:    1.  Paroxysmal atrial fibrillation/flutter: Rhythm Wigfall has presented today for surgery, with the diagnosis of AF.  The  various methods of treatment have been discussed with the patient and family. After consideration of risks, benefits and other options for treatment, the patient has consented to  Procedure(s): Catheter ablation as a surgical intervention .  Risks include but not limited to complete heart block, stroke, esophageal damage, nerve damage, bleeding, vascular damage, tamponade, perforation, MI, and death. The patient's history has been reviewed, patient examined, no change in status, stable for surgery.  I have reviewed the patient's chart and labs.  Questions were answered to the patient's satisfaction.    Tijuana Scheidegger Norton, MD 07/05/2024 11:45 AM

## 2024-07-05 NOTE — Anesthesia Procedure Notes (Signed)
 Procedure Name: Intubation Date/Time: 07/05/2024 11:17 AM  Performed by: Moishe Reyes CROME, CRNAPre-anesthesia Checklist: Patient identified, Emergency Drugs available, Suction available, Patient being monitored and Timeout performed Patient Re-evaluated:Patient Re-evaluated prior to induction Oxygen Delivery Method: Circle system utilized Preoxygenation: Pre-oxygenation with 100% oxygen Induction Type: IV induction Ventilation: Mask ventilation without difficulty Laryngoscope Size: McGrath and 3 Grade View: Grade I Tube type: Oral Number of attempts: 1 Airway Equipment and Method: Video-laryngoscopy and Stylet Placement Confirmation: ETT inserted through vocal cords under direct vision, positive ETCO2, CO2 detector and breath sounds checked- equal and bilateral Secured at: 22 cm Tube secured with: Tape Dental Injury: Teeth and Oropharynx as per pre-operative assessment

## 2024-07-06 ENCOUNTER — Encounter (HOSPITAL_COMMUNITY): Payer: Self-pay | Admitting: Cardiology

## 2024-07-06 ENCOUNTER — Telehealth (HOSPITAL_COMMUNITY): Payer: Self-pay

## 2024-07-06 NOTE — Telephone Encounter (Signed)
 Attempted to reach patient to follow up with procedure completed on 07/05/24, no answer. Left VM for patient to return call.

## 2024-07-06 NOTE — Telephone Encounter (Signed)
 Spoke with patient to complete post procedure follow up call.  Patient reports no complications with groin sites.   Instructions reviewed with patient:  Remove large bandage at puncture site after 24 hours. It is normal to have bruising, tenderness, mild swelling, and a pea or marble sized lump/knot at the groin site which can take up to three months to resolve.  Get help right away if you notice sudden swelling at the puncture site.  Check your puncture site every day for signs of infection: fever, redness, swelling, pus drainage, warmth, foul odor or excessive pain. If this occurs, please call the office at [phone], to speak with the nurse. Get help right away if your puncture site is bleeding and the bleeding does not stop after applying firm pressure to the area.  You may continue to have skipped beats/ atrial fibrillation during the first several months after your procedure.  It is very important not to miss any doses of your blood thinner Xarelto .   You will follow up with the Afib clinic on 08/02/24 and follow up with the Afib clinic on 10/03/24.   Patient verbalized understanding to all instructions provided.

## 2024-07-07 MED FILL — Propofol IV Emul 1000 MG/100ML (10 MG/ML): INTRAVENOUS | Qty: 97.52 | Status: AC

## 2024-07-07 MED FILL — Fentanyl Citrate Preservative Free (PF) Inj 100 MCG/2ML: INTRAMUSCULAR | Qty: 2 | Status: AC

## 2024-07-07 NOTE — Anesthesia Postprocedure Evaluation (Addendum)
 Anesthesia Post Note  Patient: Darin Garcia  Procedure(s) Performed: ATRIAL FIBRILLATION ABLATION     Patient location during evaluation: Cath Lab Anesthesia Type: General Level of consciousness: sedated and patient cooperative Pain management: pain level controlled Vital Signs Assessment: post-procedure vital signs reviewed and stable Respiratory status: spontaneous breathing Cardiovascular status: stable Anesthetic complications: no   There were no known notable events for this encounter.  Last Vitals:  Vitals:   07/05/24 1630 07/05/24 1700  BP: 136/78 (!) 140/92  Pulse: (!) 53 (!) 57  Resp: (!) 21 14  Temp:    SpO2: 98% 100%    Last Pain:  Vitals:   07/05/24 1520  TempSrc: Temporal  PainSc: 0-No pain                 Norleen Pope

## 2024-08-02 ENCOUNTER — Ambulatory Visit (HOSPITAL_COMMUNITY): Admitting: Physician Assistant

## 2024-08-10 ENCOUNTER — Ambulatory Visit (HOSPITAL_COMMUNITY)
Admission: RE | Admit: 2024-08-10 | Discharge: 2024-08-10 | Disposition: A | Discharge status: Home / Self Care | Source: Ambulatory Visit | Attending: Physician Assistant | Admitting: Physician Assistant

## 2024-08-10 ENCOUNTER — Encounter (HOSPITAL_COMMUNITY): Payer: Self-pay | Admitting: Physician Assistant

## 2024-08-10 VITALS — BP 136/80 | HR 58 | Ht 75.0 in | Wt 225.4 lb

## 2024-08-10 DIAGNOSIS — I480 Paroxysmal atrial fibrillation: Secondary | ICD-10-CM | POA: Diagnosis not present

## 2024-08-10 DIAGNOSIS — I483 Typical atrial flutter: Secondary | ICD-10-CM

## 2024-08-10 NOTE — Progress Notes (Signed)
 Primary Care Physician: Andrew Truman GRADE., MD Primary Cardiologist: Dr Jeffrie Primary Electrophysiologist: Dr Inocencio  Referring Physician: Dr Kelsie [name] is a 63 y.o. male with a history of typical atrial flutter s/p ablation, recurrent DVT and pulmonary embolism, cardiomyopathy (EF is 45-50% in the setting of rapidly conducting atrial flutter) who presents for follow up in the [facility]. Patient has a CHADS2VASC score of 1. He is s/p flutter ablation x 2 in 2020. Patient was seen at the ED 04/27/21 with tachypalpitations and dizziness. ECG showed rapid afib on arrival but he converted to SR spontaneously. He had another episode 04/30/21 which lasted for several hours. There were no specific triggers that he could identify. Patient is s/p afib ablation 07/17/21 with Dr Kelsie. He had recurrence of his afib and underwent repeat ablation with Dr Inocencio on 07/05/24.  Patient returns for follow up for atrial fibrillation. He states that he has done well since his ablation with no interim symptoms of afib. He denies chest pain or groin issues. No bleeding issues on anticoagulation.   Today, he  denies symptoms of palpitations, chest pain, shortness of breath, orthopnea, PND, lower extremity edema, dizziness, presyncope, syncope, snoring, daytime somnolence, bleeding, or neurologic sequela. The patient is tolerating medications without difficulties and is otherwise without complaint today.    Atrial Fibrillation Risk Factors:  he does not have symptoms or diagnosis of sleep apnea.  Negative sleep study. he does not have a history of rheumatic fever. he does not have a history of alcohol use.   Atrial Fibrillation Management history:  Previous antiarrhythmic drugs: none Previous cardioversions: 11/25/18 Previous ablations: 04/04/19 flutter, 08/15/19 flutter, 07/17/21, 07/05/24 Anticoagulation history: Xarelto     Past Medical History:  Diagnosis Date    Atrial flutter (HCC)    a. s/p TEE DCCV 11/2018.   Cardiomyopathy (HCC)    Dyspnea    GERD (gastroesophageal reflux disease)    History of DVT (deep vein thrombosis)    Pulmonary embolism (HCC)     ROS- All systems are reviewed and negative except as per the HPI above.  Physical Exam: Vitals:   08/10/24 0833  BP: 136/80  Pulse: (!) 58  Weight: 102.2 kg  Height: 6' 3 (1.905 m)    GEN: Well nourished, well developed in no acute distress CARDIAC: Regular rate and rhythm, no murmurs, rubs, gallops RESPIRATORY:  Clear to auscultation without rales, wheezing or rhonchi  ABDOMEN: Soft, non-tender, non-distended EXTREMITIES:  No edema; No deformity    Wt Readings from Last 3 Encounters:  08/10/24 102.2 kg  07/05/24 99.8 kg  04/03/24 99.8 kg    EKG today demonstrates  SB Vent. rate 58 BPM PR interval 172 ms QRS duration 86 ms QT/QTcB 420/412 ms   Echo 12/28/19 demonstrated   1. Left ventricular ejection fraction, by visual estimation, is 60 to  65%. The left ventricle has normal function. There is mildly increased left ventricular hypertrophy.   2. Left ventricular diastolic parameters are consistent with Grade I  diastolic dysfunction (impaired relaxation).   3. The left ventricle has no regional wall motion abnormalities.   4. Global right ventricle has normal systolic function.The right  ventricular size is normal. No increase in right ventricular wall  thickness.   5. Left atrial size was normal.   6. Right atrial size was normal.   7. The mitral valve is normal in structure. No evidence of mitral valve  regurgitation. No evidence of mitral stenosis.  8. The tricuspid valve is normal in structure.   9. The tricuspid valve is normal in structure. Tricuspid valve  regurgitation is mild.  10. The aortic valve is normal in structure. Aortic valve regurgitation is not visualized. No evidence of aortic valve sclerosis or stenosis.  11. The pulmonic valve was normal in  structure. Pulmonic valve  regurgitation is not visualized.  12. Normal pulmonary artery systolic pressure.  13. The inferior vena cava is normal in size with greater than 50%  respiratory variability, suggesting right atrial pressure of 3 mmHg.   Epic records are reviewed at length today   CHA2DS2-VASc Score = 1  The patient's score is based upon: CHF History: 0 HTN History: 0 Diabetes History: 0 Stroke History: 0 Vascular Disease History: 1 Age Score: 0 Gender Score: 0       ASSESSMENT AND PLAN: Paroxysmal Atrial Fibrillation/atrial flutter (ICD10:  I48.0) The patient's CHA2DS2-VASc score is 1, indicating a 0.6% annual risk of stroke.   S/p typical flutter ablation 04/04/19 and 08/15/19. Afib ablation 07/17/21 and 07/05/24. Patient appears to be maintaining SR Continue Xarelto  20 mg daily for 3 months post ablation. Then decrease back to 10 mg daily for h/o DVT/PE. Continue Lopressor  12.5 mg BID PRN for heart racing.   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 Fluid status appears stable today  CAD CAC 558 No anginal symptoms Followed by Dr Jeffrie  Bradycardia TST showed appropriate heart rate response to activity No indication for PPM currently Avoid scheduled AV nodal agents.    Follow up in the AF clinic in 2 months.     [name] PA-C [facility] [address] [place], [place] [address] [phone] 08/10/2024 9:05 AM

## 2024-09-12 ENCOUNTER — Other Ambulatory Visit (HOSPITAL_COMMUNITY): Payer: Self-pay

## 2024-09-12 ENCOUNTER — Other Ambulatory Visit: Payer: Self-pay

## 2024-10-03 ENCOUNTER — Ambulatory Visit (HOSPITAL_COMMUNITY)
Admission: RE | Admit: 2024-10-03 | Discharge: 2024-10-03 | Disposition: A | Discharge status: Home / Self Care | Source: Ambulatory Visit | Attending: Physician Assistant | Admitting: Physician Assistant

## 2024-10-03 ENCOUNTER — Other Ambulatory Visit (HOSPITAL_COMMUNITY): Payer: Self-pay

## 2024-10-03 VITALS — BP 132/76 | HR 49 | Ht 75.0 in | Wt 224.4 lb

## 2024-10-03 DIAGNOSIS — I483 Typical atrial flutter: Secondary | ICD-10-CM | POA: Diagnosis not present

## 2024-10-03 DIAGNOSIS — I4891 Unspecified atrial fibrillation: Secondary | ICD-10-CM

## 2024-10-03 DIAGNOSIS — I480 Paroxysmal atrial fibrillation: Secondary | ICD-10-CM

## 2024-10-03 MED ORDER — RIVAROXABAN 10 MG PO TABS
10.0000 mg | ORAL_TABLET | Freq: Every day | ORAL | 6 refills | Status: AC
  Filled 2024-10-03: qty 30, 30d supply, fill #0
  Filled 2024-11-01: qty 90, 90d supply, fill #1

## 2024-10-03 NOTE — Progress Notes (Signed)
 Primary Care Physician: Andrew Truman GRADE., MD Primary Cardiologist: Dr Jeffrie Primary Electrophysiologist: Dr Inocencio  Referring Physician: Dr Kelsie Alm Darin Garcia is a 63 y.o. male with a history of typical atrial flutter s/p ablation, recurrent DVT and pulmonary embolism, cardiomyopathy (EF is 45-50% in the setting of rapidly conducting atrial flutter) who presents for follow up in the [facility]. Patient has a CHADS2VASC score of 1. He is s/p flutter ablation x 2 in 2020. Patient was seen at the ED 04/27/21 with tachypalpitations and dizziness. ECG showed rapid afib on arrival but he converted to SR spontaneously. He had another episode 04/30/21 which lasted for several hours. There were no specific triggers that he could identify. Patient is s/p afib ablation 07/17/21 with Dr Kelsie. He had recurrence of his afib and underwent repeat ablation with Dr Inocencio on 07/05/24.  Patient returns for follow up for atrial fibrillation. He is in SR today and feels great. He denies any bleeding issues on anticoagulation. He exercises frequently but has not been able to run as far on the treadmill post ablation.   Today, he  denies symptoms of palpitations, chest pain, shortness of breath, orthopnea, PND, lower extremity edema, dizziness, presyncope, syncope, snoring, daytime somnolence, bleeding, or neurologic sequela. The patient is tolerating medications without difficulties and is otherwise without complaint today.    Atrial Fibrillation Risk Factors:  he does not have symptoms or diagnosis of sleep apnea.  Negative sleep study. he does not have a history of rheumatic fever. he does not have a history of alcohol use.   Atrial Fibrillation Management history:  Previous antiarrhythmic drugs: none Previous cardioversions: 11/25/18 Previous ablations: 04/04/19 flutter, 08/15/19 flutter, 07/17/21, 07/05/24 Anticoagulation history: Xarelto     Past Medical History:   Diagnosis Date   Atrial flutter (HCC)    a. s/p TEE DCCV 11/2018.   Cardiomyopathy (HCC)    Dyspnea    GERD (gastroesophageal reflux disease)    History of DVT (deep vein thrombosis)    Pulmonary embolism (HCC)     ROS- All systems are reviewed and negative except as per the HPI above.  Physical Exam: Vitals:   10/03/24 1310  Weight: 101.8 kg  Height: 6' 3 (1.905 m)    GEN: Well nourished, well developed in no acute distress CARDIAC: Regular rate and rhythm, no murmurs, rubs, gallops RESPIRATORY:  Clear to auscultation without rales, wheezing or rhonchi  ABDOMEN: Soft, non-tender, non-distended EXTREMITIES:  No edema; No deformity    Wt Readings from Last 3 Encounters:  10/03/24 101.8 kg  08/10/24 102.2 kg  07/05/24 99.8 kg    EKG today demonstrates  Junctional rhythm  Vent. rate 49 BPM PR interval * ms QRS duration 88 ms QT/QTcB 436/393 ms   Echo 12/28/19 demonstrated   1. Left ventricular ejection fraction, by visual estimation, is 60 to  65%. The left ventricle has normal function. There is mildly increased left ventricular hypertrophy.   2. Left ventricular diastolic parameters are consistent with Grade I  diastolic dysfunction (impaired relaxation).   3. The left ventricle has no regional wall motion abnormalities.   4. Global right ventricle has normal systolic function.The right  ventricular size is normal. No increase in right ventricular wall  thickness.   5. Left atrial size was normal.   6. Right atrial size was normal.   7. The mitral valve is normal in structure. No evidence of mitral valve  regurgitation. No evidence of mitral stenosis.  8. The tricuspid valve is normal in structure.   9. The tricuspid valve is normal in structure. Tricuspid valve  regurgitation is mild.  10. The aortic valve is normal in structure. Aortic valve regurgitation is not visualized. No evidence of aortic valve sclerosis or stenosis.  11. The pulmonic valve was normal  in structure. Pulmonic valve  regurgitation is not visualized.  12. Normal pulmonary artery systolic pressure.  13. The inferior vena cava is normal in size with greater than 50%  respiratory variability, suggesting right atrial pressure of 3 mmHg.   Epic records are reviewed at length today   CHA2DS2-VASc Score = 1  The patient's score is based upon: CHF History: 0 HTN History: 0 Diabetes History: 0 Stroke History: 0 Vascular Disease History: 1 Age Score: 0 Gender Score: 0       ASSESSMENT AND PLAN: Paroxysmal Atrial Fibrillation (ICD10:  I48.0) The patient's CHA2DS2-VASc score is 1, indicating a 0.6% annual risk of stroke.   S/p typical flutter ablation 04/04/19 and 08/15/19 S/p afib ablation 07/17/21 and 07/05/24 Patient appears to be maintaining SR Decrease Xarelto  to 10 mg daily (h/o DVT/PE) Continue Lopressor  12.5 mg PRN for heart racing.   HFrecEF EF normalized with SR Fluid status appears stable today  CAD CAC score 558 No anginal symptoms Followed by Dr Jeffrie  Bradycardia TST showed appropriate heart rate response.  No indication for PPM at this time Avoiding AV nodal agents.    Follow up with Dr Inocencio in 6 months.    [name] PA-C Af[facility] [address] [place], [place] [address] [phone] 10/03/2024 1:21 PM

## 2024-10-07 ENCOUNTER — Other Ambulatory Visit (HOSPITAL_COMMUNITY): Payer: Self-pay

## 2024-11-02 ENCOUNTER — Other Ambulatory Visit: Payer: Self-pay
